# Patient Record
Sex: Female | Born: 1980
Health system: Southern US, Community
[De-identification: ages and names within clinical notes are randomized; demographics above are authoritative.]

## PROBLEM LIST (undated history)

## (undated) DIAGNOSIS — M069 Rheumatoid arthritis, unspecified: Secondary | ICD-10-CM

## (undated) DIAGNOSIS — M419 Scoliosis, unspecified: Secondary | ICD-10-CM

## (undated) DIAGNOSIS — M4302 Spondylolysis, cervical region: Secondary | ICD-10-CM

## (undated) DIAGNOSIS — M549 Dorsalgia, unspecified: Secondary | ICD-10-CM

## (undated) DIAGNOSIS — Z8719 Personal history of other diseases of the digestive system: Secondary | ICD-10-CM

## (undated) DIAGNOSIS — M5136 Other intervertebral disc degeneration, lumbar region: Secondary | ICD-10-CM

## (undated) DIAGNOSIS — M542 Cervicalgia: Secondary | ICD-10-CM

## (undated) DIAGNOSIS — E282 Polycystic ovarian syndrome: Secondary | ICD-10-CM

## (undated) DIAGNOSIS — E079 Disorder of thyroid, unspecified: Secondary | ICD-10-CM

## (undated) DIAGNOSIS — M51369 Other intervertebral disc degeneration, lumbar region without mention of lumbar back pain or lower extremity pain: Secondary | ICD-10-CM

## (undated) HISTORY — DX: Other intervertebral disc degeneration, lumbar region without mention of lumbar back pain or lower extremity pain: M51.369

## (undated) HISTORY — DX: Other intervertebral disc degeneration, lumbar region: M51.36

## (undated) HISTORY — DX: Personal history of other diseases of the digestive system: Z87.19

## (undated) HISTORY — DX: Scoliosis, unspecified: M41.9

## (undated) HISTORY — DX: Disorder of thyroid, unspecified: E07.9

## (undated) HISTORY — DX: Rheumatoid arthritis, unspecified: M06.9

## (undated) HISTORY — DX: Dorsalgia, unspecified: M54.9

## (undated) HISTORY — PX: THYROIDECTOMY: SHX17

## (undated) HISTORY — DX: Spondylolysis, cervical region: M43.02

## (undated) HISTORY — DX: Cervicalgia: M54.2

## (undated) HISTORY — PX: WISDOM TOOTH EXTRACTION: SHX21

## (undated) HISTORY — DX: Polycystic ovarian syndrome: E28.2

## (undated) HISTORY — PX: COLONOSCOPY: SHX174

---

## 2007-09-11 HISTORY — PX: ANKLE SURGERY: SHX546

## 2012-12-22 ENCOUNTER — Other Ambulatory Visit: Payer: Self-pay | Admitting: Endocrinology

## 2012-12-22 DIAGNOSIS — E049 Nontoxic goiter, unspecified: Secondary | ICD-10-CM

## 2013-01-02 ENCOUNTER — Ambulatory Visit
Admission: RE | Admit: 2013-01-02 | Discharge: 2013-01-02 | Disposition: A | Discharge status: Home / Self Care | Payer: Medicaid Other | Source: Ambulatory Visit | Attending: Endocrinology | Admitting: Endocrinology

## 2013-01-02 DIAGNOSIS — E049 Nontoxic goiter, unspecified: Secondary | ICD-10-CM

## 2013-01-02 IMAGING — US US SOFT TISSUE HEAD/NECK
1 series · 14 of 25 positions shown · non-contrast
Comparison: None.

CLINICAL DATA: Multinodular goiter.  History of Hashimoto's
thyroiditis.

THYROID ULTRASOUND
TECHNIQUE: Ultrasound examination of the thyroid gland and adjacent
soft tissues was performed.

[Series 1: us soft tissue head/neck · 0.09mm/px · 14 of 55 slices shown]
[im 1/55]
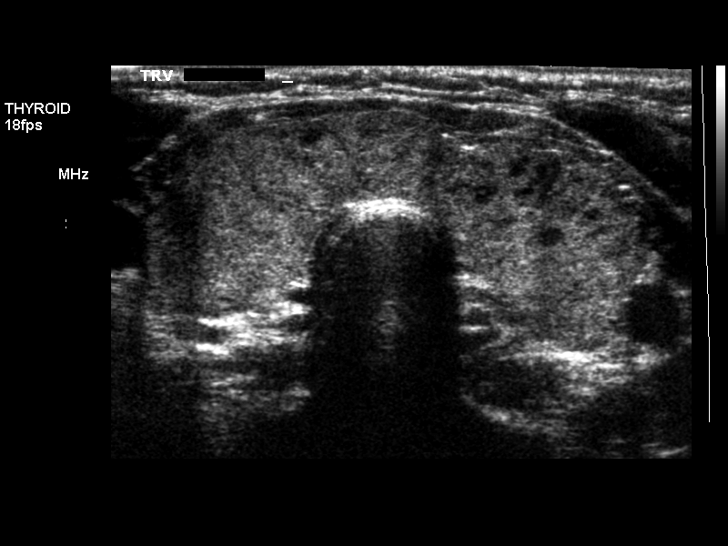
[im 5/55]
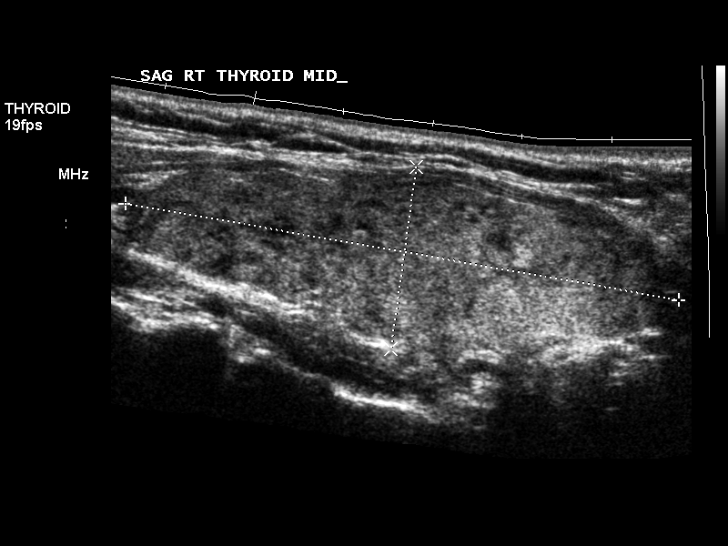
[im 10/55]
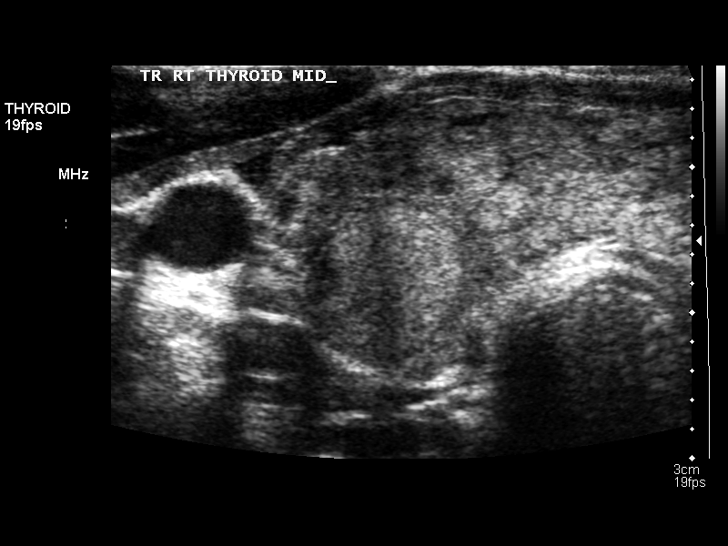
[im 14/55]
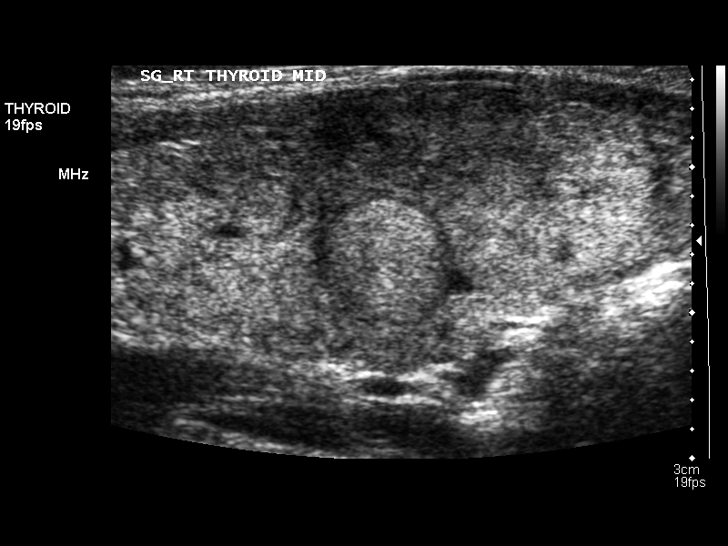
[im 19/55]
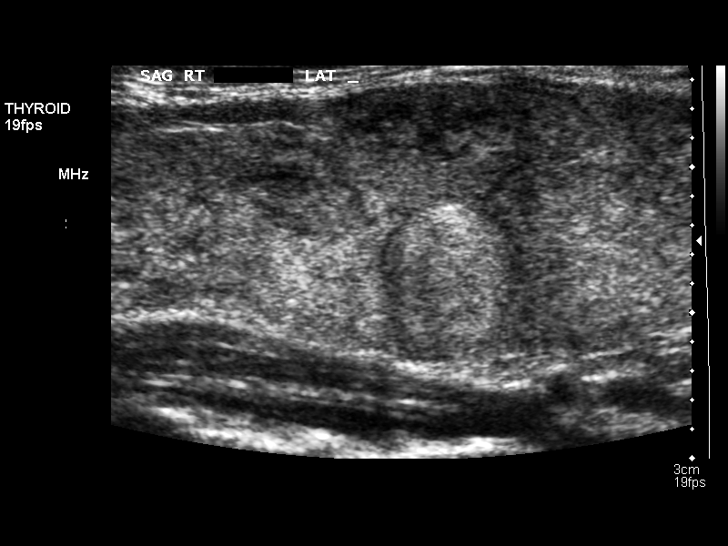
[im 21/55]
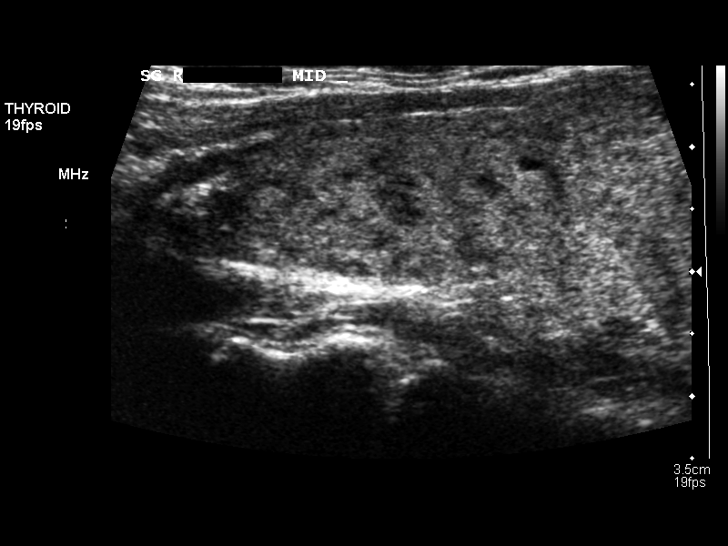
[im 25/55]
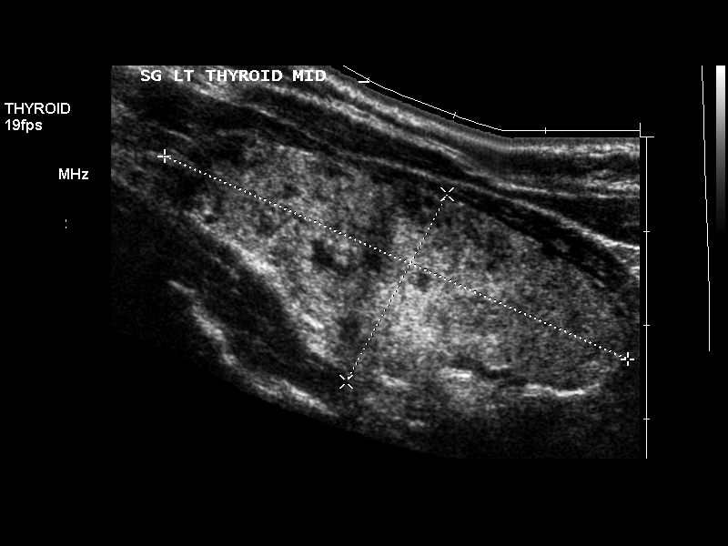
[im 30/55]
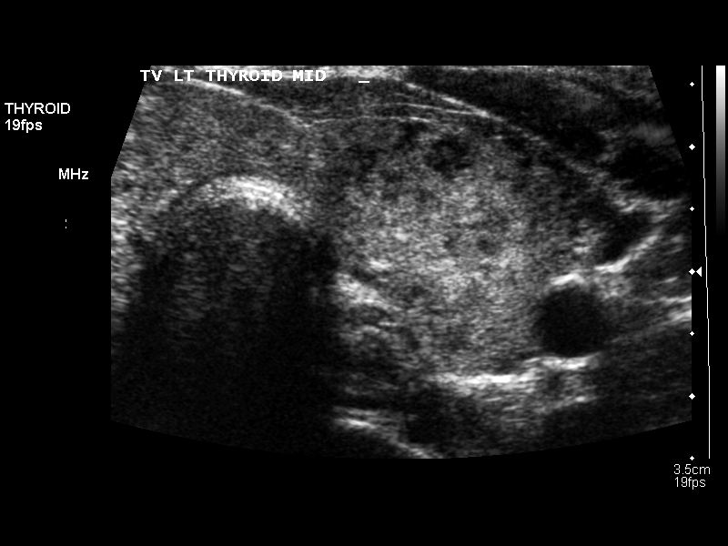
[im 34/55]
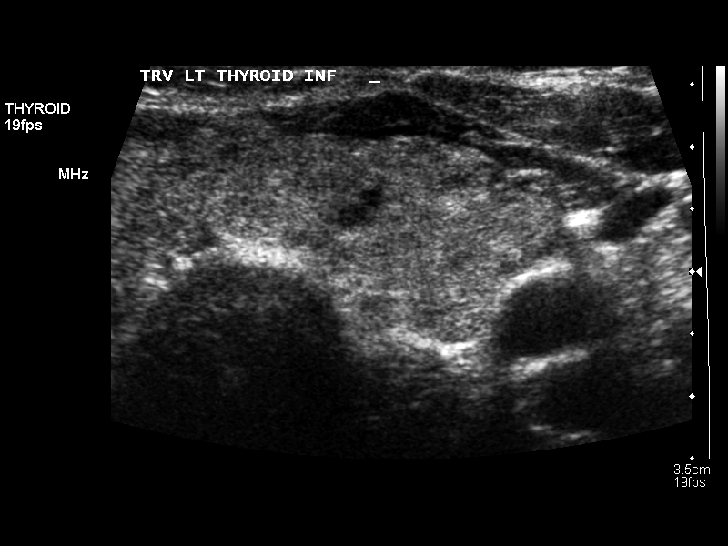
[im 37/55]
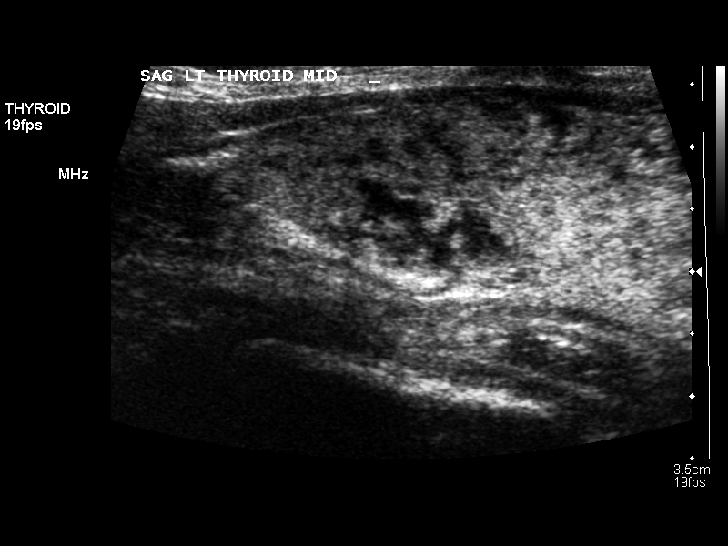
[im 41/55]
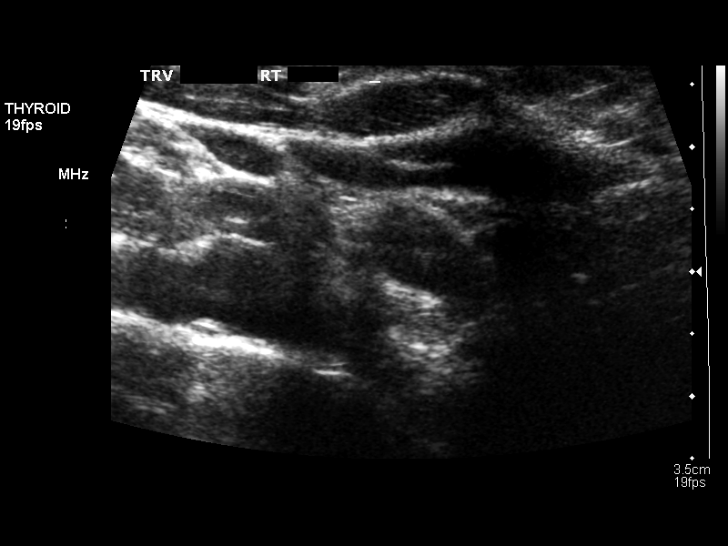
[im 46/55]
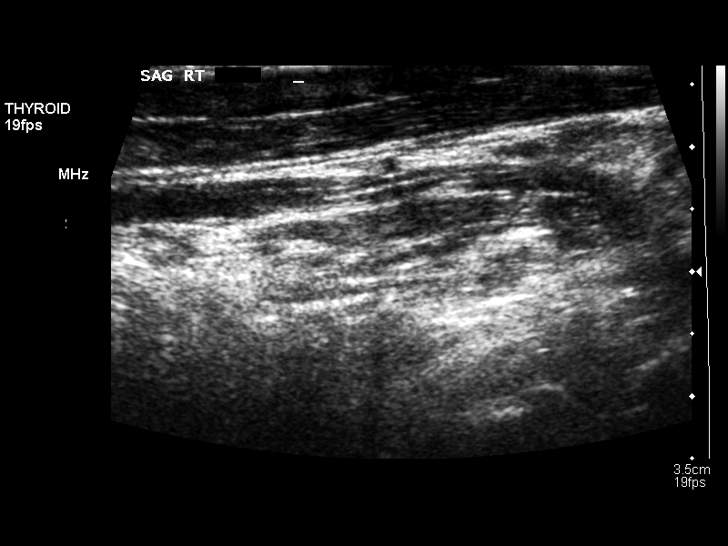
[im 50/55]
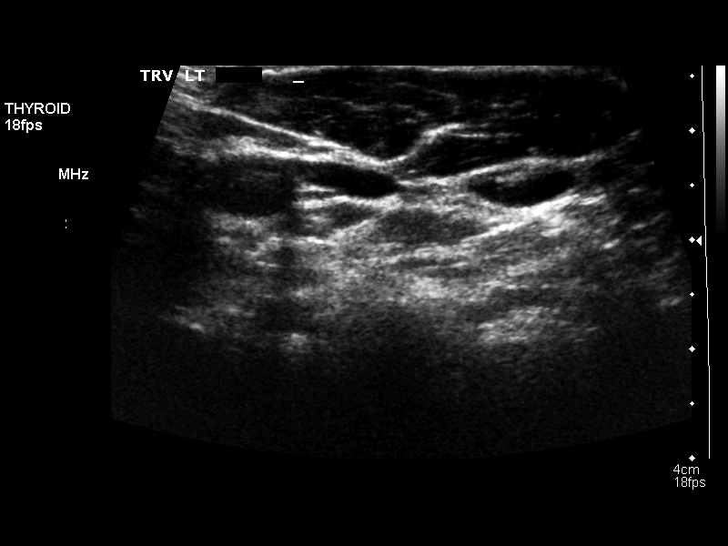
[im 55/55]
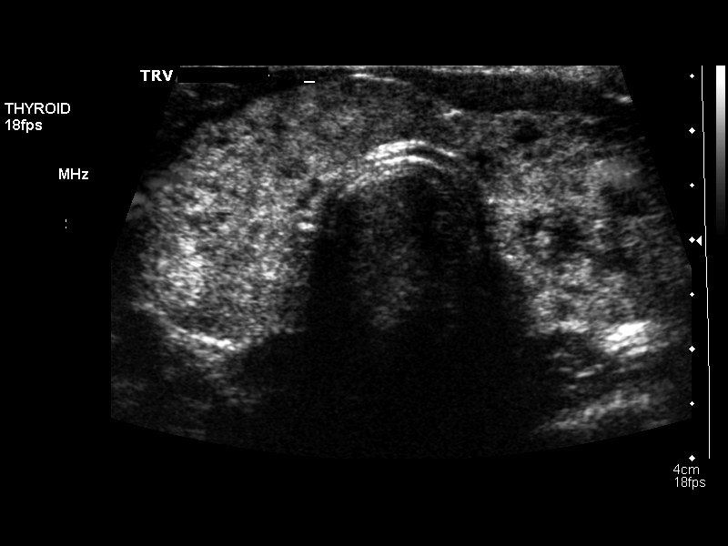

[14 of 25 positions shown; findings below may reference images not displayed]

FINDINGS: Right thyroid lobe:  62 mm x 20 mm x 20 mm.  Heterogeneous
echotexture.
Left thyroid lobe:  54 mm x 23 mm x 20 mm.  Heterogeneous texture.
Isthmus:  8 mm.

Focal nodules:  10 mm x 9 mm x 9 mm solid nodule is present in the
right thyroid lobe interpolar region.

Diffuse hypervascularity of the thyroid gland is present.

Lymphadenopathy:  None visualized.
IMPRESSION: 1.  Thyromegaly with heterogeneous echotexture compatible with
history of thyroiditis.
2.  Solid sub centimeter nodule in the right thyroid lobe.  This
nodule can be followed.
3.  Hypervascularity suggests ongoing thyroiditis.

## 2013-01-27 ENCOUNTER — Other Ambulatory Visit (HOSPITAL_COMMUNITY)
Admission: RE | Admit: 2013-01-27 | Discharge: 2013-01-27 | Disposition: A | Discharge status: Home / Self Care | Payer: Medicaid Other | Source: Ambulatory Visit | Attending: Obstetrics and Gynecology | Admitting: Obstetrics and Gynecology

## 2013-01-27 ENCOUNTER — Other Ambulatory Visit: Payer: Self-pay | Admitting: Obstetrics and Gynecology

## 2013-01-27 DIAGNOSIS — O09A0 Supervision of pregnancy with history of molar pregnancy, unspecified trimester: Secondary | ICD-10-CM | POA: Insufficient documentation

## 2013-01-27 DIAGNOSIS — Z1151 Encounter for screening for human papillomavirus (HPV): Secondary | ICD-10-CM | POA: Insufficient documentation

## 2013-01-27 DIAGNOSIS — Z01419 Encounter for gynecological examination (general) (routine) without abnormal findings: Secondary | ICD-10-CM | POA: Insufficient documentation

## 2013-06-10 ENCOUNTER — Other Ambulatory Visit: Payer: Self-pay | Admitting: *Deleted

## 2013-06-10 DIAGNOSIS — E039 Hypothyroidism, unspecified: Secondary | ICD-10-CM | POA: Insufficient documentation

## 2013-06-12 ENCOUNTER — Other Ambulatory Visit: Payer: Self-pay | Admitting: Endocrinology

## 2013-06-12 ENCOUNTER — Other Ambulatory Visit: Payer: Medicaid Other

## 2013-06-12 LAB — TSH: TSH: 1.813 u[IU]/mL (ref 0.350–4.500)

## 2013-06-16 ENCOUNTER — Ambulatory Visit (INDEPENDENT_AMBULATORY_CARE_PROVIDER_SITE_OTHER): Payer: Medicaid Other | Admitting: Endocrinology

## 2013-06-16 ENCOUNTER — Encounter: Payer: Self-pay | Admitting: Endocrinology

## 2013-06-16 VITALS — BP 108/72 | HR 70 | Temp 98.4°F | Resp 12 | Ht 63.0 in | Wt 175.2 lb

## 2013-06-16 DIAGNOSIS — E039 Hypothyroidism, unspecified: Secondary | ICD-10-CM

## 2013-06-16 DIAGNOSIS — G43909 Migraine, unspecified, not intractable, without status migrainosus: Secondary | ICD-10-CM

## 2013-06-16 DIAGNOSIS — E282 Polycystic ovarian syndrome: Secondary | ICD-10-CM

## 2013-06-16 NOTE — Patient Instructions (Signed)
Regular exercise.

## 2013-06-16 NOTE — Progress Notes (Addendum)
Patient ID: Taylor Bennett, female   DOB: 26-Apr-1981, 32 y.o.   MRN: [id number]  Reason for Appointment:  Hypothyroidism, followup visit    History of Present Illness:   The hypothyroidism was first diagnosed in 2012 with a TSH of 9.0 At that time she was being treated for a multinodular goiter with thyroxine suppression using 50 mcg Subsequently the patient's requirement for levothyroxine has increased and she was taking 100 mcg on her last visit Because of her complaints of hair loss and persistent fatigue she was given a trial of Armour Thyroid She has been taking Armour Thyroid 60 mg alternating with 90  Complaints are reported by the patient now are occasional fatigue, however tiredness is not as frequent as before. She said that she sometimes needs more sleep but also occasionally will have more swings        She does not complain of any cold intolerance or constipation.           Compliance with the medical regimen has been as prescribed with taking the tablet in the morning before breakfast. However she had difficulty breaking the tablet in half evenly  No results found for any previous visit.    Medication List       This list is accurate as of: 06/16/13 11:17 AM.  Always use your most recent med list.               aspirin-acetaminophen-caffeine 250-250-65 MG per tablet  Commonly known as:  EXCEDRIN MIGRAINE  Take 1 tablet by mouth every 6 (six) hours as needed for pain.     etonogestrel-ethinyl estradiol 0.12-0.015 MG/24HR vaginal ring  Commonly known as:  NUVARING  Place 1 each vaginally every 28 (twenty-eight) days. Insert vaginally and leave in place for 3 consecutive weeks, then remove for 1 week.     metFORMIN 500 MG tablet  Commonly known as:  GLUCOPHAGE  Take 500 mg by mouth 2 (two) times daily with a meal.     naproxen sodium 220 MG tablet  Commonly known as:  ANAPROX  Take 220 mg by mouth 2 (two) times daily with a meal.     thyroid 60 MG tablet  Commonly  known as:  ARMOUR  Take 60 mg by mouth daily before breakfast. Take 1 1/2 tablet daily alternating with 1 tablet        No past medical history on file.  No past surgical history on file.  No family history on file.  Social History:  reports that she has never smoked. She has never used smokeless tobacco. Her alcohol and drug histories are not on file.  Allergies: No Known Allergies  REVIEW of systems:  She is having difficulty with joint pains And is taking Aleve  She has a history of multinodular goiter, however the last ultrasound showed only thyroiditis with one nodule of 1 cm in size.  Migraines: Has had long history of this, is not followed by any physician  She had PCOS. Diagnosed in 4/14 when her testosterone level was 58. Subsequently has been on metformin with improvement in her levels, difficult to assess menstrual cycles because of her having an IUD   Examination:   BP 108/72  Pulse 70  Temp(Src) 98.4 F (36.9 C)  Resp 12  Ht 5\' 3"  (1.6 m)  Wt 175 lb 3.2 oz (79.47 kg)  BMI 31.04 kg/m2  SpO2 98%  LMP 06/11/2013   GENERAL APPEARANCE: Alert And looks well.   No puffiness of  face or periorbital edema.         NECK: no thyromegaly.          NEUROLOGIC EXAM: DTRs 2+ bilaterally at biceps.    Assessments   Hypothyroidism, likely to be from Hashimoto thyroiditis. Currently is on Armour Thyroid and if subjectively doing better with this than with levothyroxine and her TSH is also normal    PLAN:   Continue same dosage before breakfast daily. Avoid taking any calcium or iron supplements with the thyroid supplement.   Will reassess her testosterone level on the next visit Advised her to establish with a PCP for other problems   Bless Belshe 06/16/2013, 11:17 AM

## 2013-07-15 ENCOUNTER — Other Ambulatory Visit: Payer: Self-pay | Admitting: *Deleted

## 2013-07-15 MED ORDER — THYROID 60 MG PO TABS: 60.0000 mg | ORAL_TABLET | Freq: Every day | ORAL | Status: DC

## 2013-07-15 MED ORDER — METFORMIN HCL ER 500 MG PO TB24: ORAL_TABLET | ORAL | Status: DC

## 2013-07-15 MED ORDER — METFORMIN HCL 500 MG PO TABS: 500.0000 mg | ORAL_TABLET | Freq: Two times a day (BID) | ORAL | Status: DC

## 2013-07-17 ENCOUNTER — Other Ambulatory Visit: Payer: Self-pay | Admitting: *Deleted

## 2013-09-24 ENCOUNTER — Ambulatory Visit (INDEPENDENT_AMBULATORY_CARE_PROVIDER_SITE_OTHER): Payer: Medicaid Other | Admitting: Endocrinology

## 2013-09-24 ENCOUNTER — Encounter: Payer: Self-pay | Admitting: Endocrinology

## 2013-09-24 VITALS — BP 112/68 | HR 90 | Temp 98.2°F | Resp 12 | Ht 63.0 in | Wt 175.0 lb

## 2013-09-24 DIAGNOSIS — E039 Hypothyroidism, unspecified: Secondary | ICD-10-CM

## 2013-09-24 DIAGNOSIS — E282 Polycystic ovarian syndrome: Secondary | ICD-10-CM

## 2013-09-24 DIAGNOSIS — G9001 Carotid sinus syncope: Secondary | ICD-10-CM

## 2013-09-24 NOTE — Progress Notes (Signed)
Taylor Bennett is a 33 y.o. female.   Chief complaint: Neck pain  History of Present Illness:  For the last 2-3 weeks she has had some discomfort in her right upper neck This has been more persistent and prominent in the last 3 days She has some radiation of the pain up and down her neck, sometimes into the ear She has no difficulty or pain with swallowing although she thinks she got a metformin tablet when clearing her throat this morning on the last nights dose  She was concerned that the pain was related to her thyroid and wanted to be evaluated, has not taken any OTC medications are contacted her PCP      Medication List       This list is accurate as of: 09/24/13  3:10 PM.  Always use your most recent med list.               aspirin-acetaminophen-caffeine 250-250-65 MG per tablet  Commonly known as:  EXCEDRIN MIGRAINE  Take 1 tablet by mouth every 6 (six) hours as needed for pain.     etonogestrel-ethinyl estradiol 0.12-0.015 MG/24HR vaginal ring  Commonly known as:  [place] 1 each vaginally every 28 (twenty-eight) days. Insert vaginally and leave in place for 3 consecutive weeks, then remove for 1 week.     metFORMIN 500 MG 24 hr tablet  Commonly known as:  GLUCOPHAGE-XR  Take one tablet 2 times daily     naproxen sodium 220 MG tablet  Commonly known as:  ANAPROX  Take 220 mg by mouth 2 (two) times daily with a meal.     thyroid 60 MG tablet  Commonly known as:  ARMOUR  Take 60 mg by mouth. Take 1 1/2 tablet daily alternating with 1 tablet        Allergies: No Known Allergies  Past Medical History  Diagnosis Date  . PCOS (polycystic ovarian syndrome)   . Thyroid disease     No past surgical history on file.  Family History  Problem Relation Age of Onset  . Thyroid disease Mother   . Diabetes Father     Social History:  reports that she has never smoked. She has never used smokeless tobacco. Her alcohol and drug histories are not on  file.  Review of Systems   She has had a small multinodular goiter, initially discovered in 2009 when she had a carotid ultrasound In 12/2012 ultrasound showed only thyroiditis and a 1 cm thyroid nodule, smaller than before   LABS:  No visits with results within 1 Week(s) from this visit. Latest known visit with results is:  Orders Only on 06/12/2013  Component Date Value Range Status  . TSH 06/12/2013 1.813  0.350 - 4.500 uIU/mL Final  . Free T4 06/12/2013 0.94  0.80 - 1.80 ng/dL Final    EXAM:  BP 112/68  Pulse 90  Temp(Src) 98.2 F (36.8 C)  Resp 12  Ht 5\' 3"  (1.6 m)  Wt 175 lb (79.379 kg)  BMI 31.01 kg/m2  SpO2 97%  LMP 09/10/2013  There is no lymphadenopathy in the neck Her thyroid is just palpable on the right side, smooth, nontender and slightly firm. No nodules palpable She has mild tenderness over the carotid bulb area but not clearly defined No other mass felt in the neck or submandibular area Tympanic membrane on the right is normal. Normal pharyngeal exam  Assessment/Plan:   She has a right-sided neck pain which is unrelated to her  thyroid She may have carotidynia as she does not have any other findings on exam except mild tenderness in that area Offered the patient a course of nonsteroidal anti-inflammatory drugs but she prefers to see her PCP  She will followup in 3 months for her thyroid and PCOS.  Taylor Bennett 09/24/2013, 3:10 PM

## 2013-11-18 ENCOUNTER — Other Ambulatory Visit: Payer: Medicaid Other

## 2013-11-23 ENCOUNTER — Other Ambulatory Visit: Payer: Medicaid Other

## 2013-11-23 ENCOUNTER — Ambulatory Visit: Payer: Medicaid Other | Admitting: Endocrinology

## 2013-12-23 ENCOUNTER — Other Ambulatory Visit (INDEPENDENT_AMBULATORY_CARE_PROVIDER_SITE_OTHER): Payer: Medicaid Other

## 2013-12-23 ENCOUNTER — Other Ambulatory Visit: Payer: Medicaid Other

## 2013-12-23 DIAGNOSIS — E282 Polycystic ovarian syndrome: Secondary | ICD-10-CM

## 2013-12-23 DIAGNOSIS — E039 Hypothyroidism, unspecified: Secondary | ICD-10-CM

## 2013-12-23 LAB — TSH: TSH: 1.11 u[IU]/mL (ref 0.35–5.50)

## 2013-12-23 LAB — T4, FREE: FREE T4: 0.68 ng/dL (ref 0.60–1.60)

## 2013-12-25 LAB — TESTOSTERONE, FREE, TOTAL, SHBG
TESTOSTERONE FREE: 2.4 pg/mL (ref 0.0–4.2)
TESTOSTERONE, TOTAL: 23.9 ng/dL (ref 10.0–55.0)

## 2013-12-29 ENCOUNTER — Encounter: Payer: Self-pay | Admitting: Endocrinology

## 2013-12-29 ENCOUNTER — Ambulatory Visit (INDEPENDENT_AMBULATORY_CARE_PROVIDER_SITE_OTHER): Payer: Medicaid Other | Admitting: Endocrinology

## 2013-12-29 VITALS — BP 118/82 | HR 78 | Temp 97.7°F | Resp 16 | Ht 63.0 in | Wt 174.8 lb

## 2013-12-29 DIAGNOSIS — E282 Polycystic ovarian syndrome: Secondary | ICD-10-CM

## 2013-12-29 DIAGNOSIS — E039 Hypothyroidism, unspecified: Secondary | ICD-10-CM

## 2013-12-29 DIAGNOSIS — E042 Nontoxic multinodular goiter: Secondary | ICD-10-CM

## 2013-12-29 DIAGNOSIS — H8110 Benign paroxysmal vertigo, unspecified ear: Secondary | ICD-10-CM

## 2013-12-29 NOTE — Patient Instructions (Signed)
Stop Metformin

## 2013-12-29 NOTE — Progress Notes (Signed)
Patient ID: Taylor Bennett, female   DOB: [birth year], 33 y.o.   MRN: [id number]   Reason for Appointment: Followup   History of Present Illness:   1. Primary hypothyroidism was first diagnosed in 2012 with a TSH of 9.0 At that time she was being treated for a multinodular goiter with thyroxine suppression using 50 mcg Subsequently the patient's requirement for levothyroxine has increased and she had been taking 100 mcg Because of her complaints of hair loss and persistent fatigue she was given a trial of Armour Thyroid With this change her energy level overall improved She has been taking Armour Thyroid 60 mg alternating with 90 with consistent TSH levels  Complaints are reported by the patient now are mild fatigue, however tiredness is not as frequent as before. She is still complaining about hair loss She does not complain of any new cold intolerance or constipation.           Compliance with the medical regimen has been as prescribed with taking the tablet in the morning before breakfast.    2. Polycystic ovarian syndrome  She had PCOS diagnosed in 4/14 when her total testosterone level was 58. Her only symptoms appeared to be hair loss and long-standing irregular menstrual cycles.  Subsequently has been on metformin with improvement in her testosterone levels.  However it is difficult to assess menstrual cycles because of her having an IUD She tends to  forget her metformin in the evening and has been mostly taking 1 tablet of 500 mg daily. Her testosterone level is still quite normal   Appointment on 12/23/2013  Component Date Value Ref Range Status  . Free T4 12/23/2013 0.68  0.60 - 1.60 ng/dL Final  . TSH 12/23/2013 1.11  0.35 - 5.50 uIU/mL Final  . Testosterone, total 12/23/2013 23.9  10.0 - 55.0 ng/dL Final  . Testosterone, Free 12/23/2013 2.4  0.0 - 4.2 pg/mL Final      Medication List       This list is accurate as of: 12/29/13 11:05 AM.  Always use your most recent med  list.               aspirin-acetaminophen-caffeine 250-250-65 MG per tablet  Commonly known as:  EXCEDRIN MIGRAINE  Take 1 tablet by mouth every 6 (six) hours as needed for pain.     etonogestrel-ethinyl estradiol 0.12-0.015 MG/24HR vaginal ring  Commonly known as:  NUVARING  Place 1 each vaginally every 28 (twenty-eight) days. Insert vaginally and leave in place for 3 consecutive weeks, then remove for 1 week.     metFORMIN 500 MG 24 hr tablet  Commonly known as:  GLUCOPHAGE-XR  Take one tablet 2 times daily     naproxen sodium 220 MG tablet  Commonly known as:  ANAPROX  Take 220 mg by mouth 2 (two) times daily with a meal.     thyroid 60 MG tablet  Commonly known as:  ARMOUR  Take 60 mg by mouth. Take 1 1/2 tablet daily alternating with 1 tablet        Past Medical History  Diagnosis Date  . PCOS (polycystic ovarian syndrome)   . Thyroid disease     No past surgical history on file.  Family History  Problem Relation Age of Onset  . Thyroid disease Mother   . Diabetes Father     Social History:  reports that she has never smoked. She has never used smokeless tobacco. Her alcohol and drug histories are not on file.  Allergies: No Known Allergies  REVIEW of systems:  She is having occasional transient vertigo-like symptoms  She has a history of multinodular goiter, however the last ultrasound showed only thyroiditis with one nodule of 1 cm in size.  Migraines: Has had long history of this, is not followed by any physician  Difficulty losing weight:  Wt Readings from Last 3 Encounters:  12/29/13 174 lb 12.8 oz (79.289 kg)  09/24/13 175 lb (79.379 kg)  06/16/13 175 lb 3.2 oz (79.47 kg)     Examination:   BP 118/82  Pulse 78  Temp(Src) 97.7 F (36.5 C)  Resp 16  Ht 5\' 3"  (1.6 m)  Wt 174 lb 12.8 oz (79.289 kg)  BMI 30.97 kg/m2  SpO2 97%   GENERAL APPEARANCE: Alert And looks well.   No puffiness of face or periorbital edema.         NECK:  her  thyroid is enlarged about twice normal on the right side, less on the left side, smooth without nodule formation         NEUROLOGIC EXAM:  reflexes normal bilaterally at biceps. No significant on the fascia or facial hair    Assessments:    Hypothyroidism, likely to be from [place] thyroiditis associated with a goiter Currently is on Armour Thyroid 60 alternating with 90 mg TSH is consistently normal She probably does have fatigue for other reasons and also chronic cord intolerance Also reassured her that her goiter does not need to be followed with ultrasound exams   History of irregular menstrual cycles and ? PCOS. Her testosterone level is quite normal even with only 500 mg of metformin. Not clear if she truly has had polycystic ovarian syndrome Most likely her hair loss is autoimmune in nature and unrelated to hyperaldosteronism Encouraged her to have a regular program of exercise and watch her diet for weight loss especially with family history of diabetes   PLAN:   Continue same dosage of Armour Thyroid before breakfast daily. Avoid taking any calcium or iron supplements with the thyroid supplement.   Stop metformin for now   Will reassess her testosterone level on the next visit  Advised her to followup with her PCP to evaluate symptoms of possible vertigo   Elayne Snare 12/29/2013, 11:05 AM

## 2014-01-29 ENCOUNTER — Other Ambulatory Visit: Payer: Self-pay | Admitting: Obstetrics and Gynecology

## 2014-01-29 ENCOUNTER — Other Ambulatory Visit (HOSPITAL_COMMUNITY)
Admission: RE | Admit: 2014-01-29 | Discharge: 2014-01-29 | Disposition: A | Discharge status: Home / Self Care | Payer: Medicaid Other | Source: Ambulatory Visit | Attending: Obstetrics and Gynecology | Admitting: Obstetrics and Gynecology

## 2014-01-29 DIAGNOSIS — Z01419 Encounter for gynecological examination (general) (routine) without abnormal findings: Secondary | ICD-10-CM | POA: Insufficient documentation

## 2014-06-07 ENCOUNTER — Other Ambulatory Visit (INDEPENDENT_AMBULATORY_CARE_PROVIDER_SITE_OTHER): Payer: Medicaid Other

## 2014-06-07 ENCOUNTER — Other Ambulatory Visit: Payer: Medicaid Other

## 2014-06-07 DIAGNOSIS — E282 Polycystic ovarian syndrome: Secondary | ICD-10-CM

## 2014-06-07 DIAGNOSIS — E039 Hypothyroidism, unspecified: Secondary | ICD-10-CM

## 2014-06-07 LAB — GLUCOSE, RANDOM: GLUCOSE: 97 mg/dL (ref 70–99)

## 2014-06-07 LAB — T4, FREE: Free T4: 0.77 ng/dL (ref 0.60–1.60)

## 2014-06-07 LAB — TSH: TSH: 0.66 u[IU]/mL (ref 0.35–4.50)

## 2014-06-09 LAB — TESTOSTERONE, FREE, TOTAL, SHBG
Testosterone, Free: 1 pg/mL (ref 0.0–4.2)
Testosterone, total: 21.9 ng/dL (ref 10.0–55.0)

## 2014-06-10 ENCOUNTER — Encounter: Payer: Self-pay | Admitting: Endocrinology

## 2014-06-10 ENCOUNTER — Ambulatory Visit (INDEPENDENT_AMBULATORY_CARE_PROVIDER_SITE_OTHER): Payer: Medicaid Other | Admitting: Endocrinology

## 2014-06-10 VITALS — BP 116/80 | HR 90 | Temp 97.8°F | Resp 14 | Ht 63.0 in | Wt 176.8 lb

## 2014-06-10 DIAGNOSIS — E039 Hypothyroidism, unspecified: Secondary | ICD-10-CM

## 2014-06-10 DIAGNOSIS — L659 Nonscarring hair loss, unspecified: Secondary | ICD-10-CM

## 2014-06-10 NOTE — Progress Notes (Signed)
Patient ID: Taylor Bennett, female   DOB: 01/08/1981, 33 y.o.   MRN: [id number]   Reason for Appointment: Followup   History of Present Illness:   1. Primary hypothyroidism was first diagnosed in 2012 with a TSH of 9.0 At that time she was being treated for a multinodular goiter with thyroxine suppression using 50 mcg Subsequently the patient's requirement for levothyroxine has increased and she had been taking 100 mcg Because of her complaints of hair loss and persistent fatigue she was given a trial of Armour Thyroid With this change her energy level was overall improved She has been taking Armour Thyroid 60 mg alternating with 90 with consistent TSH levels  Complaints are reported by the patient now are tiredness and she feels like she has to take a nap every 4 hours  This has been persistent She is still having some thinning of the hair and hair loss but somewhat less than before         Compliance with the medical regimen has been as prescribed with taking the tablet in the morning before breakfast.   She has a history of multinodular goiter, however the last ultrasound showed only thyroiditis with one nodule of 1 cm in size.   2. ? Polycystic ovarian syndrome She had PCOS diagnosed in 4/14 by another physician when her total testosterone level was 58.  Her only symptoms appeared to be hair loss and  previously irregular menstrual cycles which were regulated by birth control pills  Subsequently had been on metformin with improvement in her testosterone levels However she was taking only 500 mg of metformin and with stopping this her testosterone levels are still normal She now says that she has regular menstrual cycles even though she is using a NuvaRing   Appointment on 06/07/2014  Component Date Value Ref Range Status  . Glucose, Bld 06/07/2014 97  70 - 99 mg/dL Final  . TSH 06/07/2014 0.66  0.35 - 4.50 uIU/mL Final  . Free T4 06/07/2014 0.77  0.60 - 1.60 ng/dL Final  .  Testosterone, total 06/07/2014 21.9  10.0 - 55.0 ng/dL Final  . Testosterone, Free 06/07/2014 1.0  0.0 - 4.2 pg/mL Final      Medication List       This list is accurate as of: 06/10/14 10:10 AM.  Always use your most recent med list.               etonogestrel-ethinyl estradiol 0.12-0.015 MG/24HR vaginal ring  Commonly known as:  NUVARING  Place 1 each vaginally every 28 (twenty-eight) days. Insert vaginally and leave in place for 3 consecutive weeks, then remove for 1 week.     metFORMIN 500 MG 24 hr tablet  Commonly known as:  GLUCOPHAGE-XR  Take one tablet 2 times daily     naproxen sodium 220 MG tablet  Commonly known as:  ANAPROX  Take 220 mg by mouth 2 (two) times daily with a meal.     OVER THE COUNTER MEDICATION  Total control Herbalife     rizatriptan 10 MG tablet  Commonly known as:  MAXALT  Take 10 mg by mouth as needed for migraine. May repeat in 2 hours if needed     thyroid 60 MG tablet  Commonly known as:  ARMOUR  Take 60 mg by mouth. Take 1 1/2 tablet daily alternating with 1 tablet        Past Medical History  Diagnosis Date  . PCOS (polycystic ovarian syndrome)   .  Thyroid disease     No past surgical history on file.  Family History  Problem Relation Age of Onset  . Thyroid disease Mother   . Diabetes Father     Social History:  reports that she has never smoked. She has never used smokeless tobacco. Her alcohol and drug histories are not on file.  Allergies: No Known Allergies  REVIEW of systems:  She is having increased bruising for the last month and she thinks her blood didn't clot as quickly when she had a blood draw  Migraines: Treated by PCP  Difficulty losing weight:  Wt Readings from Last 3 Encounters:  06/10/14 176 lb 12.8 oz (80.196 kg)  12/29/13 174 lb 12.8 oz (79.289 kg)  09/24/13 175 lb (79.379 kg)     Examination:   BP 116/80  Pulse 90  Temp(Src) 97.8 F (36.6 C)  Resp 14  Ht 5\' 3"  (1.6 m)  Wt 176 lb  12.8 oz (80.196 kg)  BMI 31.33 kg/m2  SpO2 96%   GENERAL APPEARANCE: She looks well    No puffiness of face or periorbital edema.  Minimal hirsutism, no alopecia        NECK:  her thyroid is enlarged about twice normal on the right side relatively smooth and firm, left side not palpable  NEUROLOGIC EXAM:  reflexes normal bilaterally at biceps. Has scattered ecchymoses on her skin    Assessments:    Hypothyroidism, likely to be from [place] thyroiditis associated with a goiter Currently is on Armour Thyroid 60 alternating with 90 mg TSH has been consistently normal She probably does have unrelated fatigue and needs to discuss with PCP Her goiter is fairly stable and feels typical of Hashimoto's thyroiditis   History of irregular menstrual cycles and ? PCOS. Her testosterone level is quite normal without metformin and do not think she has PCOS now.   She does have a upper normal fasting glucose of 97 Encouraged her to have a regular program of exercise and watch her diet for weight loss especially with family history of diabetes   PLAN:   Continue same dosage of Armour Thyroid before breakfast daily.  Advised her to followup with her PCP to evaluate bruising     Dezyrae Kensinger 06/10/2014, 10:10 AM

## 2014-08-09 ENCOUNTER — Other Ambulatory Visit: Payer: Self-pay | Admitting: Endocrinology

## 2014-08-11 ENCOUNTER — Other Ambulatory Visit: Payer: Self-pay | Admitting: *Deleted

## 2015-01-31 ENCOUNTER — Other Ambulatory Visit: Payer: Self-pay | Admitting: Obstetrics and Gynecology

## 2015-01-31 ENCOUNTER — Other Ambulatory Visit (HOSPITAL_COMMUNITY)
Admission: RE | Admit: 2015-01-31 | Discharge: 2015-01-31 | Disposition: A | Discharge status: Home / Self Care | Payer: Medicaid Other | Source: Ambulatory Visit | Attending: Obstetrics and Gynecology | Admitting: Obstetrics and Gynecology

## 2015-01-31 DIAGNOSIS — Z01419 Encounter for gynecological examination (general) (routine) without abnormal findings: Secondary | ICD-10-CM | POA: Diagnosis not present

## 2015-02-01 LAB — CYTOLOGY - PAP

## 2015-07-19 ENCOUNTER — Emergency Department (HOSPITAL_BASED_OUTPATIENT_CLINIC_OR_DEPARTMENT_OTHER)
Admission: EM | Admit: 2015-07-19 | Discharge: 2015-07-19 | Disposition: A | Discharge status: Home / Self Care | Payer: 59 | Attending: Emergency Medicine | Admitting: Emergency Medicine

## 2015-07-19 ENCOUNTER — Encounter (HOSPITAL_BASED_OUTPATIENT_CLINIC_OR_DEPARTMENT_OTHER): Payer: Self-pay | Admitting: *Deleted

## 2015-07-19 DIAGNOSIS — H7491 Unspecified disorder of right middle ear and mastoid: Secondary | ICD-10-CM | POA: Insufficient documentation

## 2015-07-19 DIAGNOSIS — Z79899 Other long term (current) drug therapy: Secondary | ICD-10-CM | POA: Insufficient documentation

## 2015-07-19 DIAGNOSIS — R51 Headache: Secondary | ICD-10-CM | POA: Diagnosis not present

## 2015-07-19 DIAGNOSIS — R42 Dizziness and giddiness: Secondary | ICD-10-CM | POA: Insufficient documentation

## 2015-07-19 DIAGNOSIS — Z3202 Encounter for pregnancy test, result negative: Secondary | ICD-10-CM | POA: Diagnosis not present

## 2015-07-19 DIAGNOSIS — R110 Nausea: Secondary | ICD-10-CM | POA: Insufficient documentation

## 2015-07-19 DIAGNOSIS — E079 Disorder of thyroid, unspecified: Secondary | ICD-10-CM | POA: Diagnosis not present

## 2015-07-19 LAB — COMPREHENSIVE METABOLIC PANEL
ALBUMIN: 3.6 g/dL (ref 3.5–5.0)
ALK PHOS: 58 U/L (ref 38–126)
ALT: 11 U/L — AB (ref 14–54)
AST: 17 U/L (ref 15–41)
Anion gap: 6 (ref 5–15)
BILIRUBIN TOTAL: 0.4 mg/dL (ref 0.3–1.2)
BUN: 14 mg/dL (ref 6–20)
CALCIUM: 8.8 mg/dL — AB (ref 8.9–10.3)
CO2: 26 mmol/L (ref 22–32)
CREATININE: 0.67 mg/dL (ref 0.44–1.00)
Chloride: 105 mmol/L (ref 101–111)
GFR calc Af Amer: >60 mL/min (ref >60)
GFR calc non Af Amer: >60 mL/min (ref >60)
GLUCOSE: 99 mg/dL (ref 65–99)
Potassium: 3.8 mmol/L (ref 3.5–5.1)
SODIUM: 137 mmol/L (ref 135–145)
Total Protein: 7.3 g/dL (ref 6.5–8.1)

## 2015-07-19 LAB — CBC WITH DIFFERENTIAL/PLATELET
BASOS PCT: 0 %
Basophils Absolute: 0 10*3/uL (ref 0.0–0.1)
EOS ABS: 0 10*3/uL (ref 0.0–0.7)
Eosinophils Relative: 0 %
HEMATOCRIT: 40.3 % (ref 36.0–46.0)
HEMOGLOBIN: 13.4 g/dL (ref 12.0–15.0)
LYMPHS ABS: 2 10*3/uL (ref 0.7–4.0)
Lymphocytes Relative: 21 %
MCH: 32 pg (ref 26.0–34.0)
MCHC: 33.3 g/dL (ref 30.0–36.0)
MCV: 96.2 fL (ref 78.0–100.0)
Monocytes Absolute: 0.9 10*3/uL (ref 0.1–1.0)
Monocytes Relative: 10 %
NEUTROS ABS: 6.5 10*3/uL (ref 1.7–7.7)
NEUTROS PCT: 69 %
Platelets: 303 10*3/uL (ref 150–400)
RBC: 4.19 MIL/uL (ref 3.87–5.11)
RDW: 11.8 % (ref 11.5–15.5)
WBC: 9.4 10*3/uL (ref 4.0–10.5)

## 2015-07-19 LAB — PREGNANCY, URINE: Preg Test, Ur: NEGATIVE

## 2015-07-19 LAB — TSH: TSH: 0.962 u[IU]/mL (ref 0.350–4.500)

## 2015-07-19 MED ORDER — MECLIZINE HCL 25 MG PO TABS
25.0000 mg | ORAL_TABLET | Freq: Once | ORAL | Status: AC
Start: 2015-07-19 — End: 2015-07-19
  Administered 2015-07-19: 25 mg via ORAL
  Filled 2015-07-19: qty 1

## 2015-07-19 MED ORDER — DEXAMETHASONE 4 MG PO TABS
10.0000 mg | ORAL_TABLET | Freq: Once | ORAL | Status: AC
  Administered 2015-07-19: 10 mg via ORAL

## 2015-07-19 MED ORDER — MECLIZINE HCL 25 MG PO TABS: 25.0000 mg | ORAL_TABLET | Freq: Three times a day (TID) | ORAL | Status: AC | PRN

## 2015-07-19 MED ORDER — DEXAMETHASONE 4 MG PO TABS
ORAL_TABLET | ORAL | Status: AC
  Filled 2015-07-19: qty 3

## 2015-07-19 NOTE — ED Notes (Signed)
Dizziness on and off x 2 weeks. Sharp pain in the right side of her head and neck pain. She has been able to work and drive.

## 2015-07-19 NOTE — ED Provider Notes (Signed)
CSN: [id number]   Arrival date & time 07/19/15 1646  History  By signing my name below, I, Taylor Bennett, attest that this documentation has been prepared under the direction and in the presence of Taylor Quale, MD. Electronically Signed: Altamease Bennett, ED Scribe. 07/19/2015. 7:39 PM.  Chief Complaint  Patient presents with  . Dizziness    HPI The history is provided by the patient. No language interpreter was used.   Taylor Bennett is a 34 y.o. female with history of thyroid disease and migraines who presents to the Emergency Department complaining of new and intermittent dizziness with onset 2 weeks ago. Prior to the onset of dizziness she was getting over a cold. The sensation is usually elicited with movement of the head and screen time at work.  Associated symptoms include an episode of sharp right sided head pains, nausea, loose stools,  and elevated blood pressure measured at (148/90). Pt denies daily headache and vomiting. She states that she has not seen her endocrinologist in years. Her primary care is with Dr. Kirby Crigler at [facility]. No history of DM or HTN.   Past Medical History  Diagnosis Date  . PCOS (polycystic ovarian syndrome)   . Thyroid disease     History reviewed. No pertinent past surgical history.  Family History  Problem Relation Age of Onset  . Thyroid disease Mother   . Diabetes Father     Social History  Substance Use Topics  . Smoking status: Never Smoker   . Smokeless tobacco: Never Used  . Alcohol Use: No     Review of Systems  Constitutional: Negative for fever, chills and fatigue.  HENT: Negative for congestion, postnasal drip and rhinorrhea.   Eyes: Negative for pain, redness and visual disturbance.  Respiratory: Negative for cough, chest tightness and shortness of breath.   Cardiovascular: Negative for chest pain, palpitations and leg swelling.  Gastrointestinal: Positive for nausea. Negative for vomiting, abdominal pain and  diarrhea.  Genitourinary: Negative for dysuria, urgency and hematuria.  Musculoskeletal: Negative for back pain and neck pain.  Skin: Negative for rash.  Neurological: Positive for dizziness. Negative for syncope, facial asymmetry, speech difficulty, weakness and numbness.       An episode of head pain   Home Medications   Prior to Admission medications   Medication Sig Start Date End Date Taking? Authorizing Provider  ARMOUR THYROID 60 MG tablet TAKE ONE TABLET BY MOUTH ONCE DAILY BEFORE  BREAKFAST,  THEN TAKE  1  AND  1/2  TABLET  DAILY  ALTERNATING  WITH  1  TABLET 08/09/14   Taylor Snare, MD  etonogestrel-ethinyl estradiol (NUVARING) 0.12-0.015 MG/24HR vaginal ring Place 1 each vaginally every 28 (twenty-eight) days. Insert vaginally and leave in place for 3 consecutive weeks, then remove for 1 week.    Historical Provider, MD  meclizine (ANTIVERT) 25 MG tablet Take 1 tablet (25 mg total) by mouth 3 (three) times daily as needed for dizziness. 07/19/15   Taylor Quale, MD  metFORMIN (GLUCOPHAGE-XR) 500 MG 24 hr tablet Take one tablet 2 times daily 07/15/13   Taylor Snare, MD  naproxen sodium (ANAPROX) 220 MG tablet Take 220 mg by mouth 2 (two) times daily with a meal.    Historical Provider, MD  [place] Total control Herbalife    Historical Provider, MD  rizatriptan (MAXALT) 10 MG tablet Take 10 mg by mouth as needed for migraine. May repeat in 2 hours if needed    Historical  Provider, MD  thyroid (ARMOUR) 60 MG tablet Take 60 mg by mouth. Take 1 1/2 tablet daily alternating with 1 tablet 07/15/13   Taylor Snare, MD    Allergies  Review of patient's allergies indicates no known allergies.  Triage Vitals: BP 135/93 mmHg  Pulse 90  Temp(Src) 99 F (37.2 C) (Oral)  Resp 20  Ht 5\' 3"  (1.6 m)  Wt 184 lb (83.462 kg)  BMI 32.60 kg/m2  SpO2 100%  LMP 07/12/2015  Physical Exam  Constitutional: She is oriented to person, place, and time. She appears well-developed and  well-nourished. No distress.  HENT:  Head: Normocephalic and atraumatic.  Right Ear: External ear normal. No tenderness. No mastoid tenderness. Tympanic membrane is injected. Tympanic membrane is not erythematous. A middle ear effusion is present.  Left Ear: External ear normal. No tenderness. No mastoid tenderness. Tympanic membrane is injected. Tympanic membrane is not erythematous.  No middle ear effusion.  Nose: Nose normal.  Mouth/Throat: Oropharynx is clear and moist. No oropharyngeal exudate.  Eyes: Pupils are equal, round, and reactive to light. Right eye exhibits nystagmus (mostly rightward and fatigable). Left eye exhibits nystagmus (mostly right ward and fatigable).  Neck: Normal range of motion. Neck supple.  Cardiovascular: Normal rate, regular rhythm, normal heart sounds and intact distal pulses.   No murmur heard. Pulmonary/Chest: Effort normal and breath sounds normal. No respiratory distress. She has no wheezes. She has no rales.  Abdominal: Soft. She exhibits no distension. There is no tenderness.  Musculoskeletal: Normal range of motion. She exhibits no edema or tenderness.  Neurological: She is alert and oriented to person, place, and time. She has normal strength. She is not disoriented. No cranial nerve deficit or sensory deficit. Coordination and gait normal.  Dizziness reproducible with head movement to the right  Skin: Skin is warm and dry. No rash noted. She is not diaphoretic.  Psychiatric: She has a normal mood and affect. Judgment normal.  Nursing note and vitals reviewed.   ED Course  Procedures   DIAGNOSTIC STUDIES: Oxygen Saturation is 100% on RA, normal by my interpretation.    COORDINATION OF CARE: 6:28 PM Discussed treatment plan which includes lab work and antivert with pt at bedside and pt agreed to plan.  Labs Reviewed  COMPREHENSIVE METABOLIC PANEL - Abnormal; Notable for the following:    Calcium 8.8 (*)    ALT 11 (*)    All other components  within normal limits  CBC WITH DIFFERENTIAL/PLATELET  TSH  PREGNANCY, URINE    Imaging Review No results found.  I personally reviewed and evaluated these lab results as a part of my medical decision-making.  MDM  Patient seen and evaluated in stable condition.  Mild fluid behind right TM.  Examination consistent with peripheral vertigo likely brought on by initial viral infection.  Labs unremarkable.  Patient given antivert and decadron with improvement.  Patient discharged home in stable condition with instructions to rest, follow up outpatient, and with prescription for antivert.  All questions answered prior to discharge.  8:39 AM 07/20/15 Patient called and message left as instructed by patient letting her know of her normal TSH. Final diagnoses:  Vertigo    I personally performed the services described in this documentation, which was scribed in my presence. The recorded information has been reviewed and is accurate.   Taylor Quale, MD 07/20/15 [phone]

## 2015-07-19 NOTE — Discharge Instructions (Signed)
Vertigo  Keep yourself well hydrated.  Take the Antivert as needed.  Follow up with your primary care physician and endocrinologist.  Vertigo means you feel like you or your surroundings are moving when they are not. Vertigo can be dangerous if it occurs when you are at work, driving, or performing difficult activities.  CAUSES  Vertigo occurs when there is a conflict of signals sent to your brain from the visual and sensory systems in your body. There are many different causes of vertigo, including:  Infections, especially in the inner ear.  A bad reaction to a drug or misuse of alcohol and medicines.  Withdrawal from drugs or alcohol.  Rapidly changing positions, such as lying down or rolling over in bed.  A migraine headache.  Decreased blood flow to the brain.  Increased pressure in the brain from a head injury, infection, tumor, or bleeding. SYMPTOMS  You may feel as though the world is spinning around or you are falling to the ground. Because your balance is upset, vertigo can cause nausea and vomiting. You may have involuntary eye movements (nystagmus). DIAGNOSIS  Vertigo is usually diagnosed by physical exam. If the cause of your vertigo is unknown, your caregiver may perform imaging tests, such as an MRI scan (magnetic resonance imaging). TREATMENT  Most cases of vertigo resolve on their own, without treatment. Depending on the cause, your caregiver may prescribe certain medicines. If your vertigo is related to body position issues, your caregiver may recommend movements or procedures to correct the problem. In rare cases, if your vertigo is caused by certain inner ear problems, you may need surgery. HOME CARE INSTRUCTIONS   Follow your caregiver's instructions.  Avoid driving.  Avoid operating heavy machinery.  Avoid performing any tasks that would be dangerous to you or others during a vertigo episode.  Tell your caregiver if you notice that certain medicines seem to  be causing your vertigo. Some of the medicines used to treat vertigo episodes can actually make them worse in some people. SEEK IMMEDIATE MEDICAL CARE IF:   Your medicines do not relieve your vertigo or are making it worse.  You develop problems with talking, walking, weakness, or using your arms, hands, or legs.  You develop severe headaches.  Your nausea or vomiting continues or gets worse.  You develop visual changes.  A family member notices behavioral changes.  Your condition gets worse. MAKE SURE YOU:  Understand these instructions.  Will watch your condition.  Will get help right away if you are not doing well or get worse.   This information is not intended to replace advice given to you by your health care provider. Make sure you discuss any questions you have with your health care provider.   Document Released: 06/06/2005 Document Revised: 11/19/2011 Document Reviewed: 12/20/2014 Elsevier Interactive Patient Education [employer].

## 2015-07-28 ENCOUNTER — Other Ambulatory Visit: Payer: Self-pay | Admitting: *Deleted

## 2015-07-28 ENCOUNTER — Other Ambulatory Visit (INDEPENDENT_AMBULATORY_CARE_PROVIDER_SITE_OTHER): Payer: Medicaid Other

## 2015-07-28 DIAGNOSIS — E039 Hypothyroidism, unspecified: Secondary | ICD-10-CM

## 2015-07-28 LAB — TSH: TSH: 0.94 u[IU]/mL (ref 0.35–4.50)

## 2015-07-29 LAB — T4, FREE: Free T4: 0.6 ng/dL (ref 0.60–1.60)

## 2015-08-02 ENCOUNTER — Encounter: Payer: Medicaid Other | Admitting: Endocrinology

## 2015-08-08 ENCOUNTER — Encounter: Payer: Self-pay | Admitting: Endocrinology

## 2015-08-08 ENCOUNTER — Ambulatory Visit (INDEPENDENT_AMBULATORY_CARE_PROVIDER_SITE_OTHER): Payer: Medicaid Other | Admitting: Endocrinology

## 2015-08-08 VITALS — BP 112/74 | HR 97 | Temp 98.4°F | Resp 14 | Ht 63.0 in | Wt 186.6 lb

## 2015-08-08 DIAGNOSIS — E041 Nontoxic single thyroid nodule: Secondary | ICD-10-CM | POA: Diagnosis not present

## 2015-08-08 DIAGNOSIS — E282 Polycystic ovarian syndrome: Secondary | ICD-10-CM

## 2015-08-08 DIAGNOSIS — E038 Other specified hypothyroidism: Secondary | ICD-10-CM

## 2015-08-08 DIAGNOSIS — E063 Autoimmune thyroiditis: Secondary | ICD-10-CM

## 2015-08-08 NOTE — Progress Notes (Signed)
Patient ID: Taylor Bennett, female   DOB: May 01, 1981, 34 y.o.   MRN: [id number]   Reason for Appointment: Followup   History of Present Illness:   1. Primary hypothyroidism was first diagnosed in 2012 with a TSH of 9.0 At that time she was being treated for a multinodular goiter with thyroxine suppression using 50 mcg Subsequently the patient's requirement for levothyroxine has increased and she had been taking 100 mcg  Because of her complaints of hair loss and persistent fatigue she was given a trial of Armour Thyroid With this change her energy level was overall improved She has been taking Armour Thyroid 60 mg alternating with 90 with consistent TSH levels         Compliance with the medical regimen has been as prescribed with taking the tablet in the morning before breakfast.  Recently does not complain of any unusual fatigue except related to traveling or stress. No significant hair loss recently.  She has a history of a thyroid nodule of 1 cm in size and also persistently enlargement of the right thyroid lobe. She is concerned about the nodule.   2. ? Polycystic ovarian syndrome She had PCOS diagnosed in 4/14 by another physician when her total testosterone level was 58.  Her only symptoms appeared to be hair loss and  previously irregular menstrual cycles which were regulated by birth control pills  Subsequently had been on metformin with improvement in her testosterone levels   However she was taking only 500 mg of metformin and with stopping this her testosterone levels were subsequently normal She still has regular menstrual cycles even though she is using a NuvaRing  Lab Results  Component Value Date   TSH 0.94 07/28/2015   TSH 0.962 07/19/2015   TSH 0.66 06/07/2014   FREET4 0.60 07/28/2015   FREET4 0.77 06/07/2014   FREET4 0.68 12/23/2013       Medication List       This list is accurate as of: 08/08/15  3:58 PM.  Always use your most recent med list.               etonogestrel-ethinyl estradiol 0.12-0.015 MG/24HR vaginal ring  Commonly known as:  [place] 1 each vaginally every 28 (twenty-eight) days. Insert vaginally and leave in place for 3 consecutive weeks, then remove for 1 week.     meclizine 25 MG tablet  Commonly known as:  ANTIVERT  Take 1 tablet (25 mg total) by mouth 3 (three) times daily as needed for dizziness.     metFORMIN 500 MG 24 hr tablet  Commonly known as:  GLUCOPHAGE-XR  Take one tablet 2 times daily     naproxen sodium 220 MG tablet  Commonly known as:  ANAPROX  Take 220 mg by mouth 2 (two) times daily with a meal.     OVER THE COUNTER MEDICATION  Total control Herbalife     rizatriptan 10 MG tablet  Commonly known as:  MAXALT  Take 10 mg by mouth as needed for migraine. May repeat in 2 hours if needed     thyroid 60 MG tablet  Commonly known as:  ARMOUR  Take 60 mg by mouth. Take 1 1/2 tablet daily alternating with 1 tablet     ARMOUR THYROID 60 MG tablet  Generic drug:  thyroid  TAKE ONE TABLET BY MOUTH ONCE DAILY BEFORE  BREAKFAST,  THEN TAKE  1  AND  1/2  TABLET  DAILY  ALTERNATING  WITH  1  TABLET        Past Medical History  Diagnosis Date  . PCOS (polycystic ovarian syndrome)   . Thyroid disease     No past surgical history on file.  Family History  Problem Relation Age of Onset  . Thyroid disease Mother   . Diabetes Father     Social History:  reports that she has never smoked. She has never used smokeless tobacco. She reports that she does not drink alcohol or use illicit drugs.  Allergies: No Known Allergies  REVIEW of systems:  Recently having problems with vertigo and is being treated with Antivert.  She is having increased bruising for the last month and she thinks her blood didn't clot as quickly when she had a blood draw  Migraines: Treated by PCP  Difficulty losing weight:  Wt Readings from Last 3 Encounters:  08/08/15 186 lb 9.6 oz (84.641 kg)  07/19/15 184  lb (83.462 kg)  06/10/14 176 lb 12.8 oz (80.196 kg)     Examination:   BP 112/74 mmHg  Pulse 97  Temp(Src) 98.4 F (36.9 C)  Resp 14  Ht 5\' 3"  (1.6 m)  Wt 186 lb 9.6 oz (84.641 kg)  BMI 33.06 kg/m2  SpO2 72%  LMP 07/12/2015   She looks well   Minimal hirsutism      NECK:  her thyroid is enlarged about twice normal on the right side relatively smooth and firm, left side not palpable.  No nodules felt  NEUROLOGIC EXAM:  reflexes normal bilaterally at biceps.     Assessments:    Hypothyroidism, likely to be from Hashimoto thyroiditis associated with a goiter Currently is on Armour Thyroid 60 alternating with 90 mg TSH has been consistently normal and she has no symptoms of Hyperthyroidism from the T3 component Her goiter is fairly stable and feels typical of Hashimoto's thyroiditis   Thyroid nodule: She is requesting a follow-up ultrasound, likely that the previous 1 cm nodule is insignificant   PLAN:   Continue same dosage of Armour Thyroid before breakfast daily.  Ultrasound ordered.   Malissie Musgrave 08/08/2015, 3:58 PM

## 2015-08-12 ENCOUNTER — Other Ambulatory Visit: Payer: Self-pay | Admitting: Endocrinology

## 2015-08-25 ENCOUNTER — Ambulatory Visit
Admission: RE | Admit: 2015-08-25 | Discharge: 2015-08-25 | Disposition: A | Discharge status: Home / Self Care | Payer: 59 | Source: Ambulatory Visit | Attending: Endocrinology | Admitting: Endocrinology

## 2015-08-25 ENCOUNTER — Other Ambulatory Visit: Payer: 59

## 2015-08-25 DIAGNOSIS — E041 Nontoxic single thyroid nodule: Secondary | ICD-10-CM

## 2015-08-25 IMAGING — US US SOFT TISSUE HEAD/NECK
1 series · 14 of 25 positions shown · non-contrast
Comparison: [DATE]

CLINICAL DATA: Follow-up nodule

EXAM:
THYROID ULTRASOUND
TECHNIQUE: Ultrasound examination of the thyroid gland and adjacent soft
tissues was performed.

[Series 1: us soft tissue head/neck · 0.08mm/px · 14 of 61 slices shown]
[im 1/61]
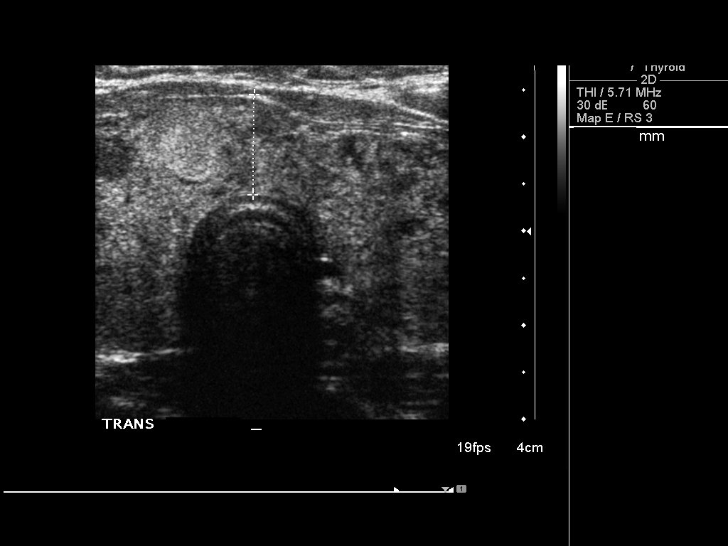
[im 6/61]
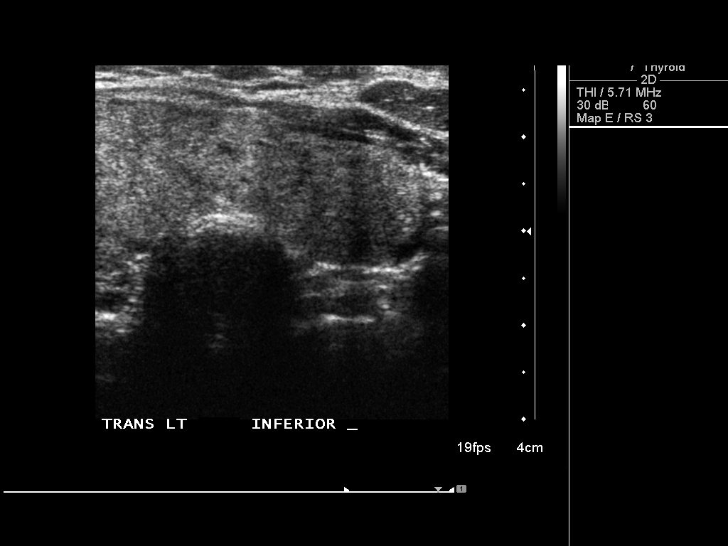
[im 11/61]
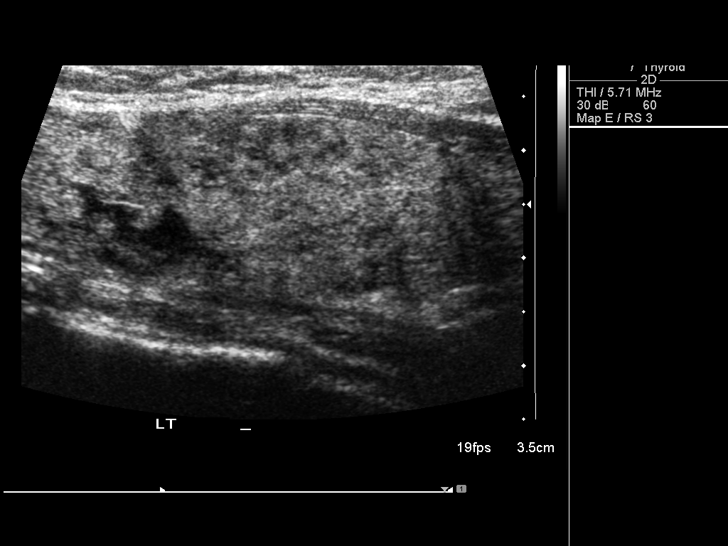
[im 16/61]
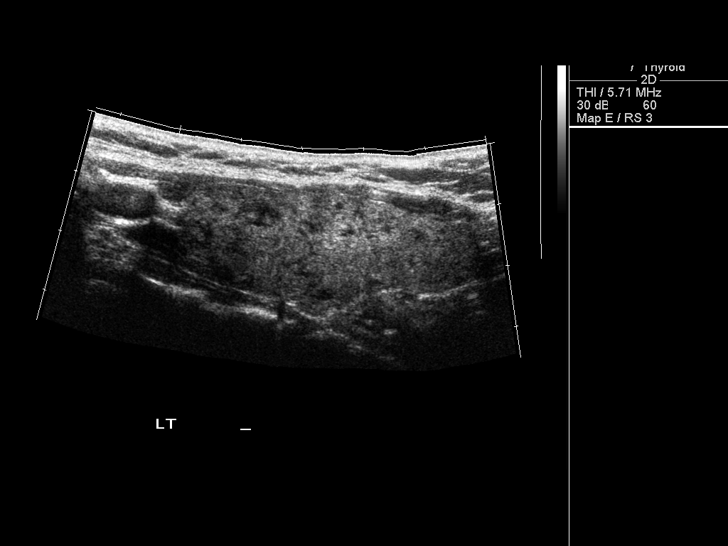
[im 21/61]
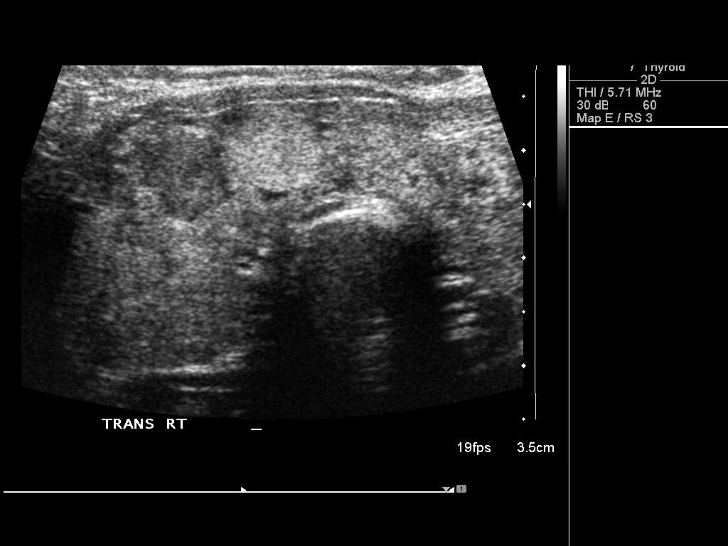
[im 23/61]
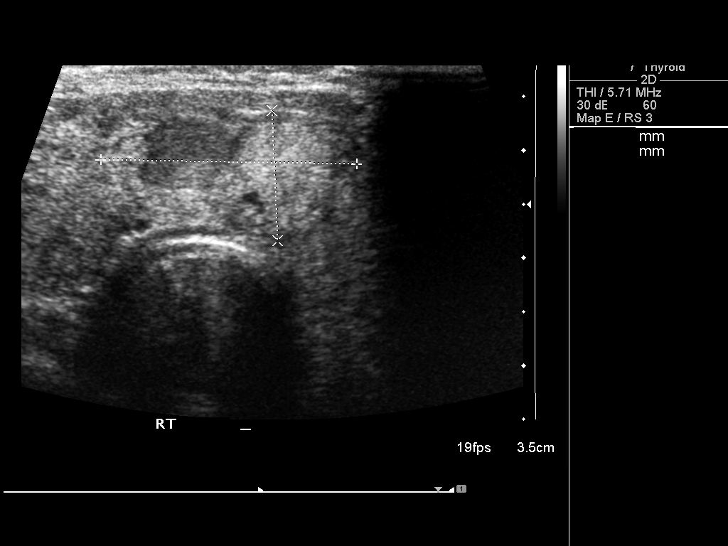
[im 28/61]
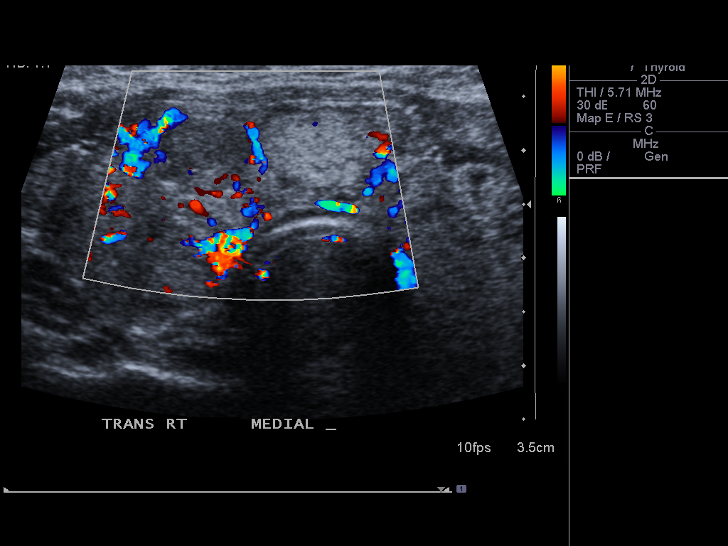
[im 33/61]
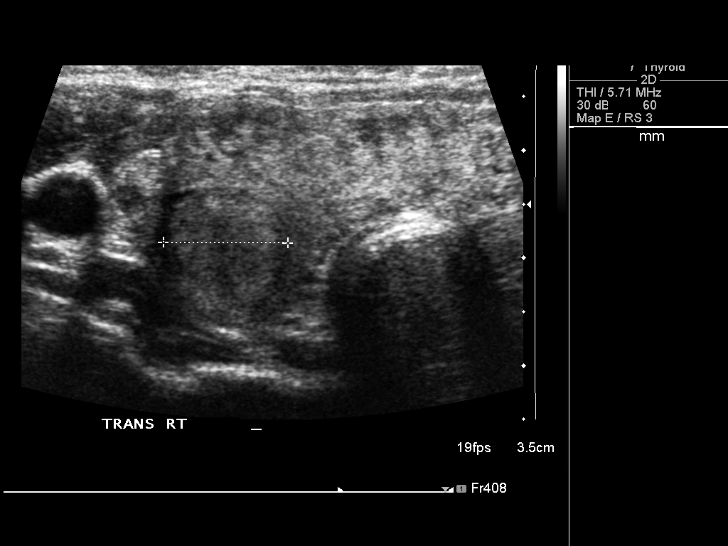
[im 38/61]
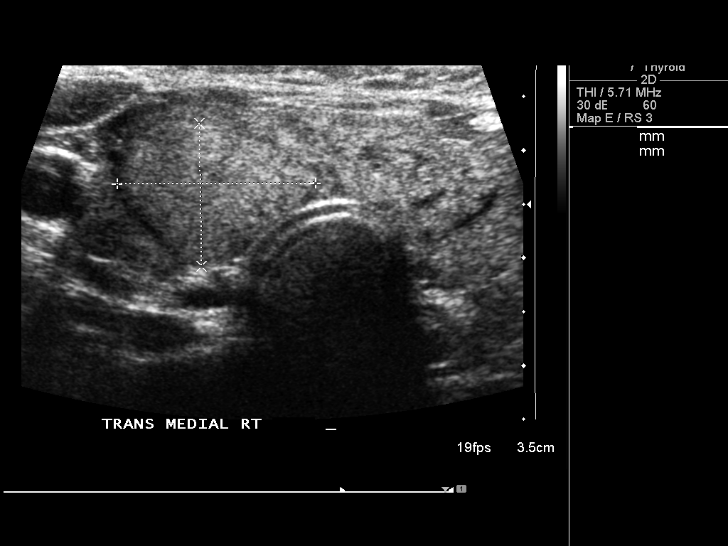
[im 41/61]
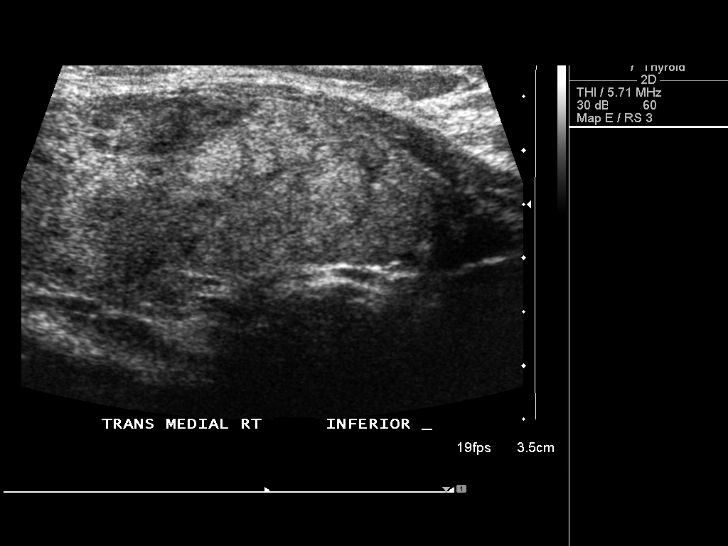
[im 46/61]
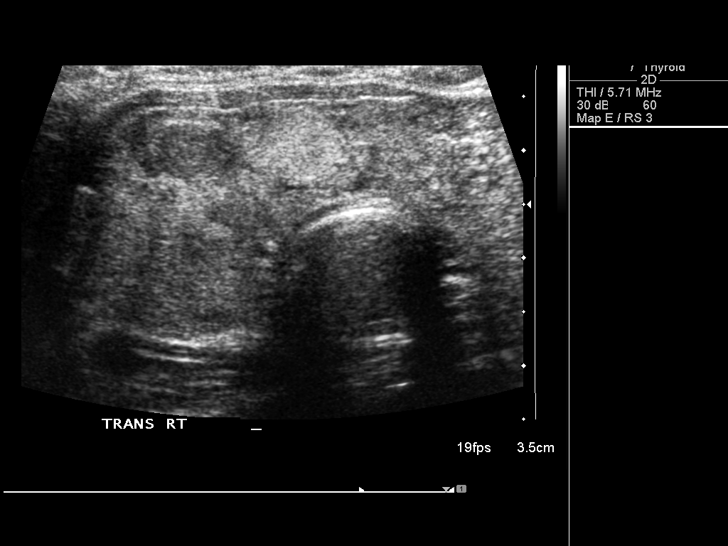
[im 51/61]
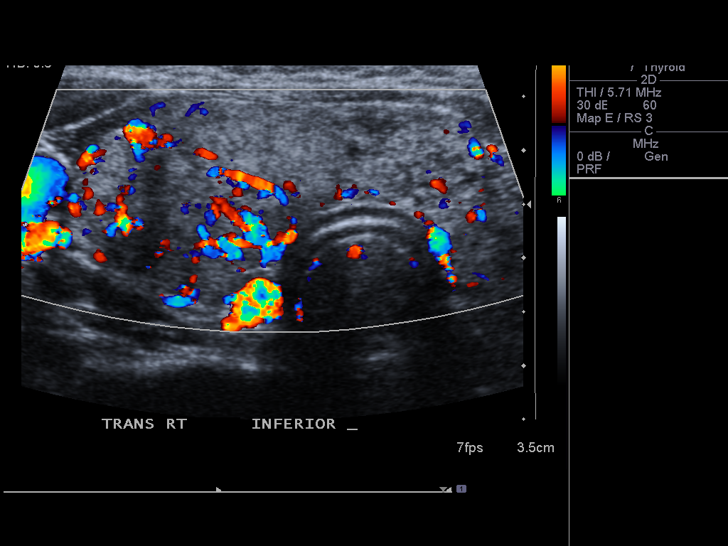
[im 56/61]
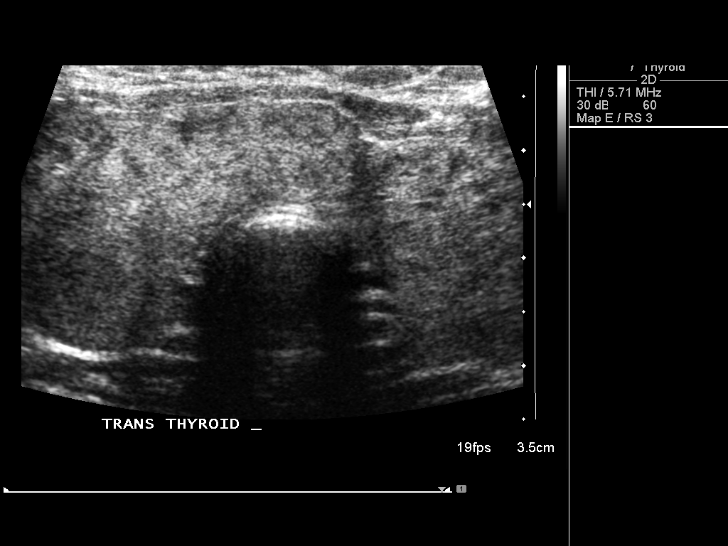
[im 61/61]
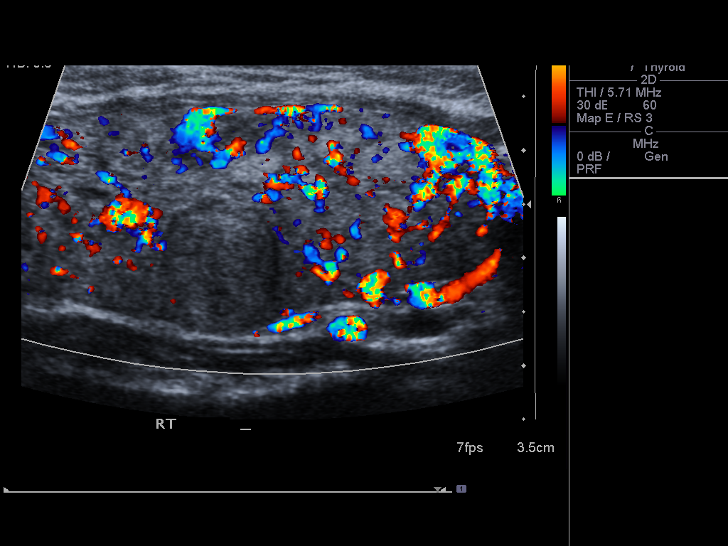

[14 of 25 positions shown; findings below may reference images not displayed]

FINDINGS: Right thyroid lobe

Measurements: 5.9 x 2.1 x 2.4 cm. Previously, only a mid lobe 10 mm
nodule was noted. Today, the gland remains heterogeneous and other
nodules are suspected. 2.4 x 1.0 x 1.8 cm mid lobe nodule versus
pseudo nodule. Adjacent medial 1.8 x 1.3 x 1.3 cm nodule versus
pseudo nodule. There is a third adjacent 14 x 12 x 12 mm nodule
versus pseudo nodule.

Left thyroid lobe

Measurements: 5.1 x 2.1 x 2.4 cm. Heterogeneous tissue without focal
nodule.

Isthmus

Thickness: 11 mm.  No nodules visualized.

Lymphadenopathy

None visualized.
IMPRESSION: There are now several nodules versus pseudo nodules in the right
lobe. The dominant 2.4 cm nodule meets criteria for fine needle
aspiration biopsy. Findings meet consensus criteria for biopsy.
Ultrasound-guided fine needle aspiration should be considered, as
per the consensus statement: Management of Thyroid Nodules Detected
at US: Society of Radiologists in Ultrasound Consensus Conference

## 2015-08-31 ENCOUNTER — Encounter: Payer: Self-pay | Admitting: Endocrinology

## 2015-08-31 ENCOUNTER — Telehealth: Payer: Self-pay | Admitting: *Deleted

## 2015-08-31 ENCOUNTER — Other Ambulatory Visit: Payer: Self-pay | Admitting: Endocrinology

## 2015-08-31 DIAGNOSIS — E041 Nontoxic single thyroid nodule: Secondary | ICD-10-CM

## 2015-08-31 NOTE — Telephone Encounter (Signed)
Discussed ultrasound with Dr. Pascal Lux, interventional radiologist.  He would prefer to examine the patient real-time on ultrasound before deciding on biopsy, this was scheduled and patient informed, likely has Hashimoto's thyroiditis.  Patient prefers to do a biopsy now

## 2015-08-31 NOTE — Telephone Encounter (Signed)
Please see email before from Mrs. Klinkner.  I just so happened to view my chart and see my ultrasound results a week after they have been finalized and I am a bit disturbed that I did not receive some kind of call to help me understand them and to set up the fine needle aspiration that is suggested. I see that there is a difference in the results from 2014 and now and it is saying that I need a biopsy. Can someone please call me back as soon as possible to address these results and tell me what needs to be done now? I am very concerned about this and don't want to enter my Christmas weekend not knowing.

## 2015-09-28 ENCOUNTER — Ambulatory Visit
Admission: RE | Admit: 2015-09-28 | Discharge: 2015-09-28 | Disposition: A | Discharge status: Home / Self Care | Payer: 59 | Source: Ambulatory Visit | Attending: Endocrinology | Admitting: Endocrinology

## 2015-09-28 ENCOUNTER — Other Ambulatory Visit (HOSPITAL_COMMUNITY)
Admission: RE | Admit: 2015-09-28 | Discharge: 2015-09-28 | Disposition: A | Discharge status: Home / Self Care | Payer: 59 | Source: Ambulatory Visit | Attending: Physician Assistant | Admitting: Physician Assistant

## 2015-09-28 DIAGNOSIS — E041 Nontoxic single thyroid nodule: Secondary | ICD-10-CM | POA: Insufficient documentation

## 2015-09-28 DIAGNOSIS — E0789 Other specified disorders of thyroid: Secondary | ICD-10-CM | POA: Diagnosis not present

## 2015-09-28 IMAGING — US US THYROID BIOPSY
1 series · 9 of 9 positions shown · non-contrast
Comparison: Ultrasound done [DATE]

CLINICAL DATA: A right mid lobe thyroid nodule measuring 2.4 x
x 1.8 cm. Request for fine needle aspirate biopsy.

EXAM:
ULTRASOUND GUIDED NEEDLE ASPIRATE BIOPSY OF THE THYROID GLAND

[Series 1: us thyroid biopsy · 0.08mm/px · 9 acquisitions, 9 frames shown]
[im 1/9]
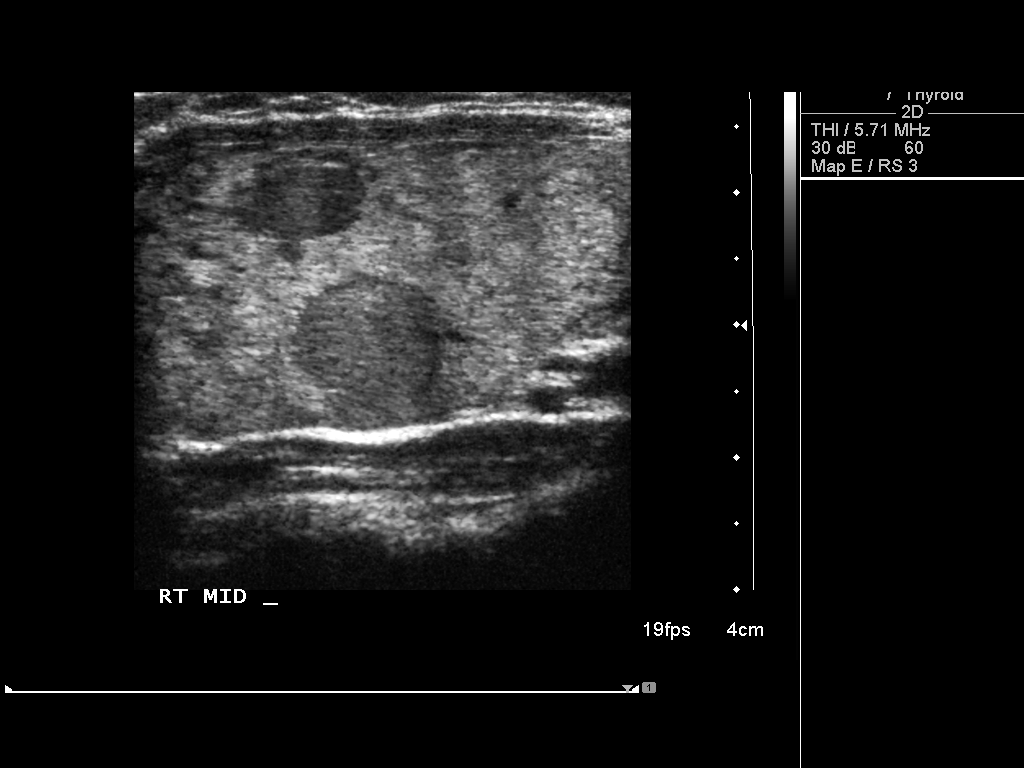
[im 2/9]
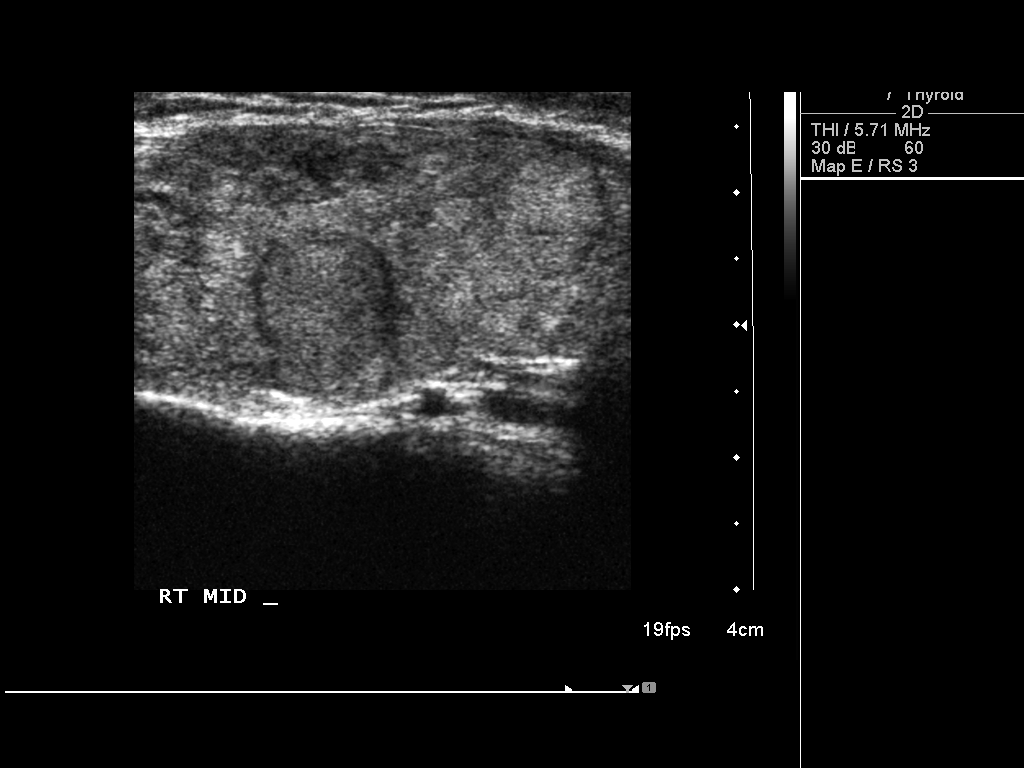
[im 3/9]
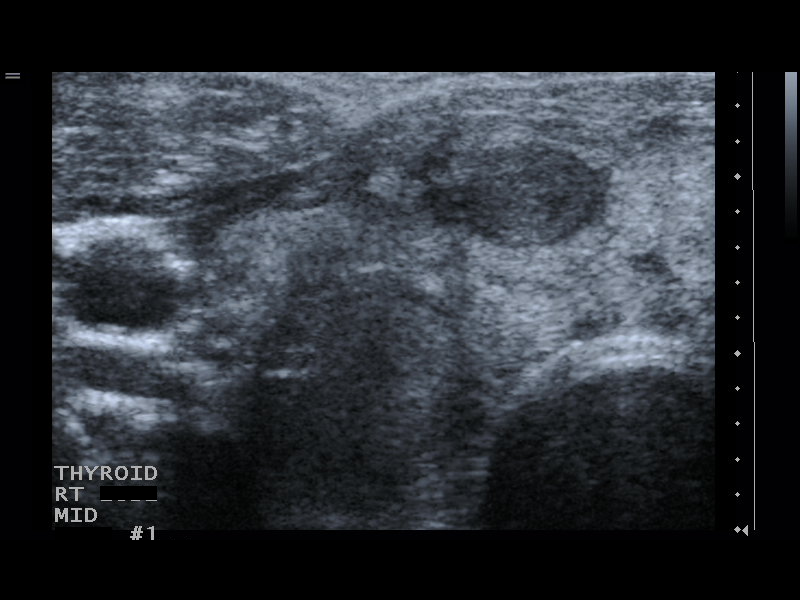
[im 4/9]
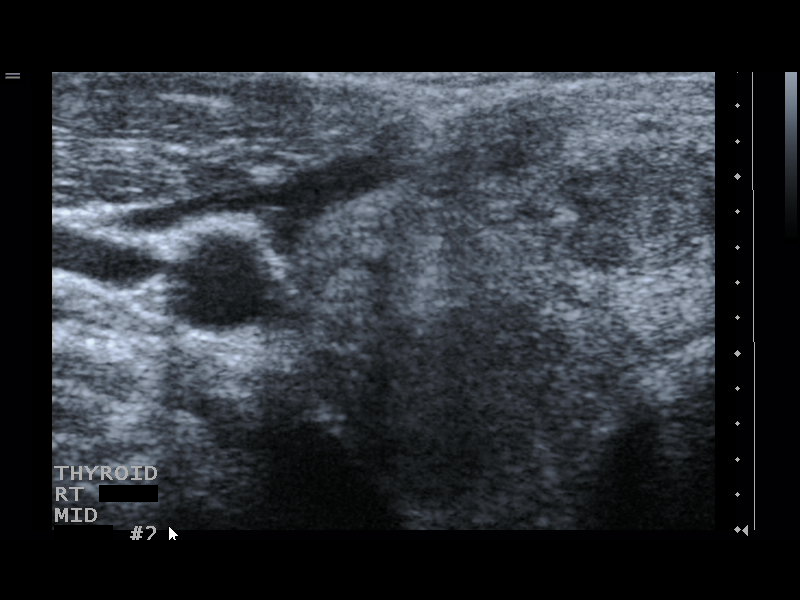
[im 5/9]
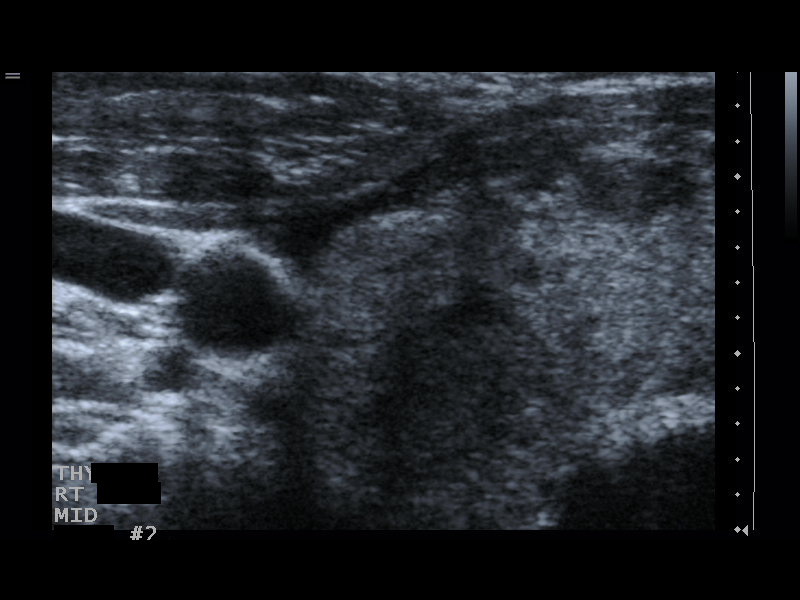
[im 6/9]
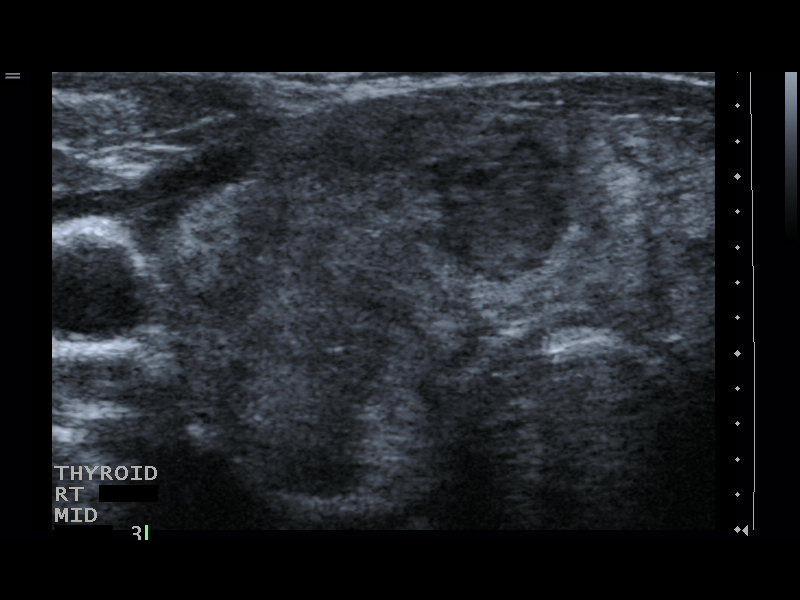
[im 7/9]
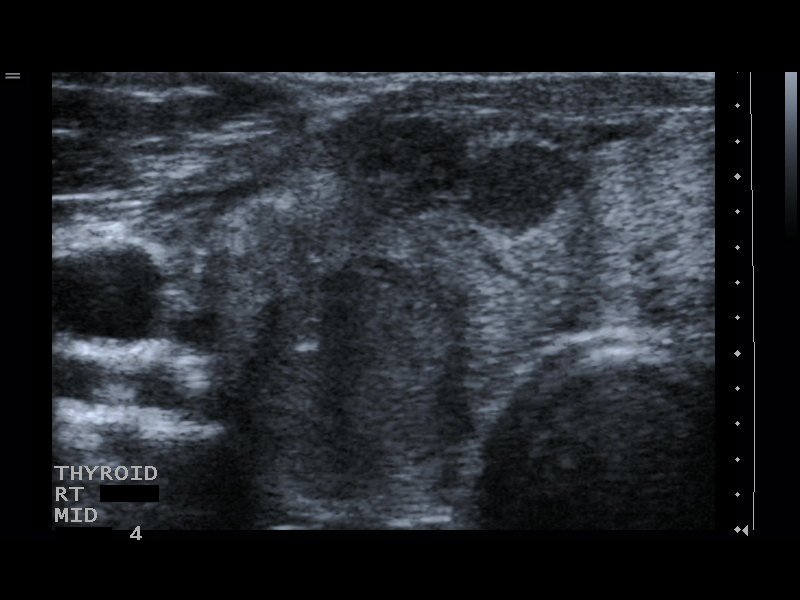
[im 8/9]
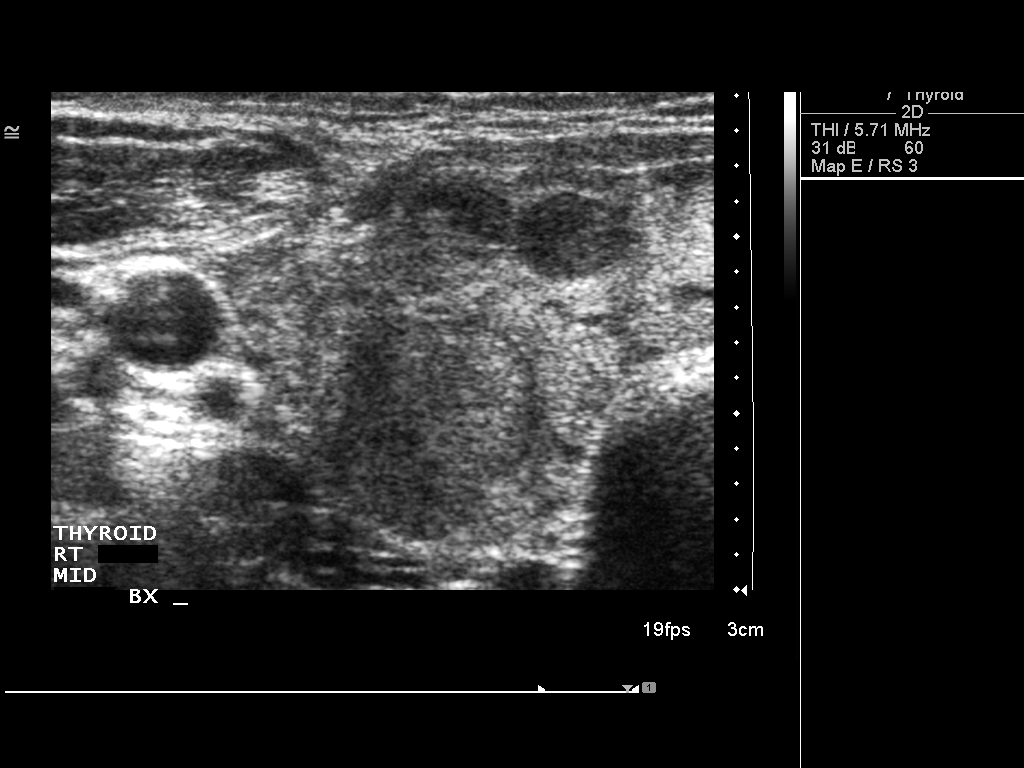
[im 9/9]
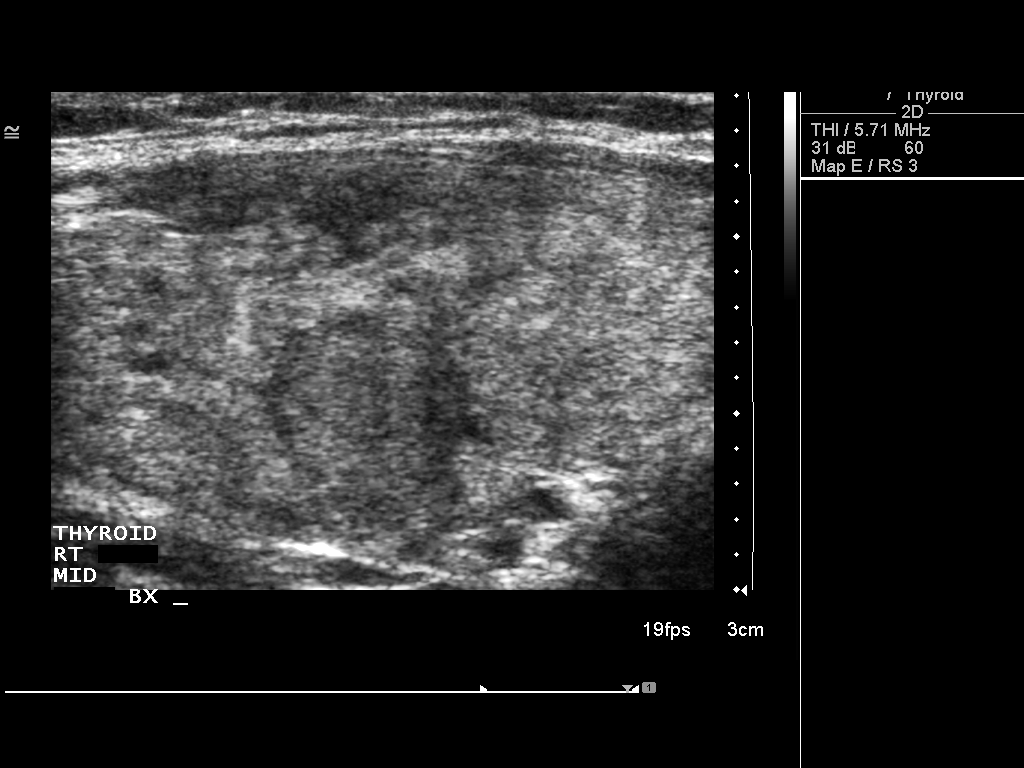

[9 of 9 positions shown; findings below may reference images not displayed]

PROCEDURE:
Thyroid biopsy was thoroughly discussed with the patient and
questions were answered. The benefits, risks, alternatives, and
complications were also discussed. The patient understands and
wishes to proceed with the procedure. Written consent was obtained.

Ultrasound was performed to localize and mark an adequate site for
the biopsy. The patient was then prepped and draped in a normal
sterile fashion. Local anesthesia was provided with 1% lidocaine.
Using direct ultrasound guidance, 4 passes were made using needles
into the nodule within the right lobe of the thyroid. Ultrasound was
used to confirm needle placements on all occasions. Specimens were
sent to Pathology for analysis.

Complications:  None immediate
FINDINGS: Imaging confirms needle placement into the right thyroid nodule
IMPRESSION: Ultrasound guided needle aspirate biopsy performed of the right
thyroid nodule.

## 2015-09-28 NOTE — Procedures (Signed)
Using direct ultrasound guidance, 4 passes were made using needles into the nodule within the right lobe of the thyroid.   Ultrasound was used to confirm needle placements on all occasions.   Specimens were sent to Pathology for analysis.   WENDY S BLAIR PA-C 07/05/2015 1:43 PM

## 2015-09-30 ENCOUNTER — Other Ambulatory Visit: Payer: Self-pay | Admitting: Endocrinology

## 2015-09-30 DIAGNOSIS — E041 Nontoxic single thyroid nodule: Secondary | ICD-10-CM

## 2015-10-03 ENCOUNTER — Telehealth: Payer: Self-pay | Admitting: Endocrinology

## 2015-10-03 NOTE — Telephone Encounter (Signed)
Noted  

## 2015-10-03 NOTE — Telephone Encounter (Signed)
done

## 2015-10-03 NOTE — Telephone Encounter (Signed)
Patient called and would like a to know if Dr. Dwyane Dee can release her biopsy results on MyChart   Thank you

## 2015-10-03 NOTE — Telephone Encounter (Signed)
Please see below and advise.

## 2015-10-12 ENCOUNTER — Other Ambulatory Visit (HOSPITAL_COMMUNITY)
Admission: RE | Admit: 2015-10-12 | Discharge: 2015-10-12 | Disposition: A | Discharge status: Home / Self Care | Payer: 59 | Source: Ambulatory Visit | Attending: Radiology | Admitting: Radiology

## 2015-10-12 ENCOUNTER — Ambulatory Visit
Admission: RE | Admit: 2015-10-12 | Discharge: 2015-10-12 | Disposition: A | Discharge status: Home / Self Care | Payer: 59 | Source: Ambulatory Visit | Attending: Endocrinology | Admitting: Endocrinology

## 2015-10-12 DIAGNOSIS — E041 Nontoxic single thyroid nodule: Secondary | ICD-10-CM | POA: Diagnosis not present

## 2015-10-12 DIAGNOSIS — Z0000 Encounter for general adult medical examination without abnormal findings: Secondary | ICD-10-CM | POA: Diagnosis not present

## 2015-10-12 DIAGNOSIS — R42 Dizziness and giddiness: Secondary | ICD-10-CM | POA: Diagnosis not present

## 2015-10-12 DIAGNOSIS — R51 Headache: Secondary | ICD-10-CM | POA: Diagnosis not present

## 2015-10-12 IMAGING — US US THYROID BIOPSY
1 series · 13 of 15 positions shown · non-contrast
Comparison: none

INDICATION: Patient with history of thyroid nodules and previous biopsy of a
dominant 2.4 cm right mid lobe nodule on [DATE]. Pathology
revealed atypia of undetermined significance vs possible follicular
neoplasm. Patient presents again today for repeat needle aspirate
biopsy of this dominant 2.4 cm right mid lobe thyroid nodule.

[Series 1: us thyroid biopsy · 0.07mm/px · 15 acquisitions, 13 frames shown]
[im 1/15]
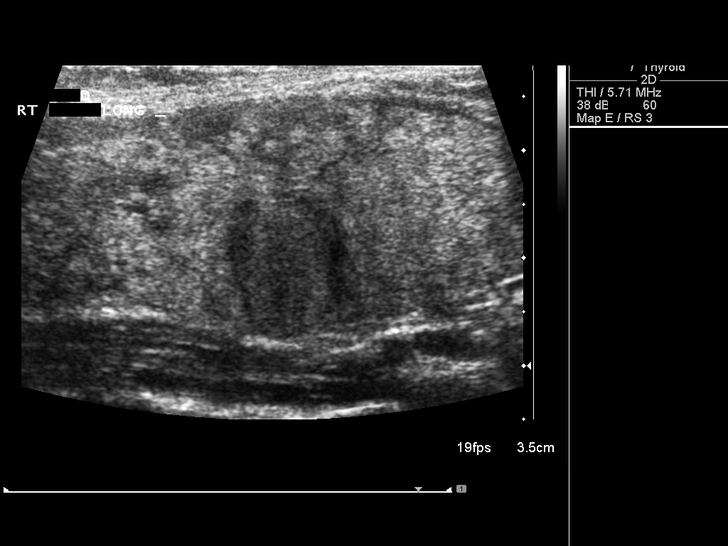
[im 2/15]
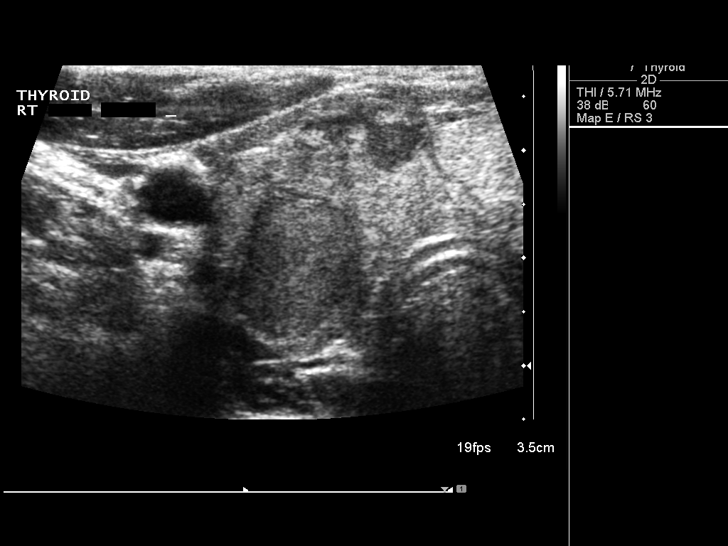
[im 3/15]
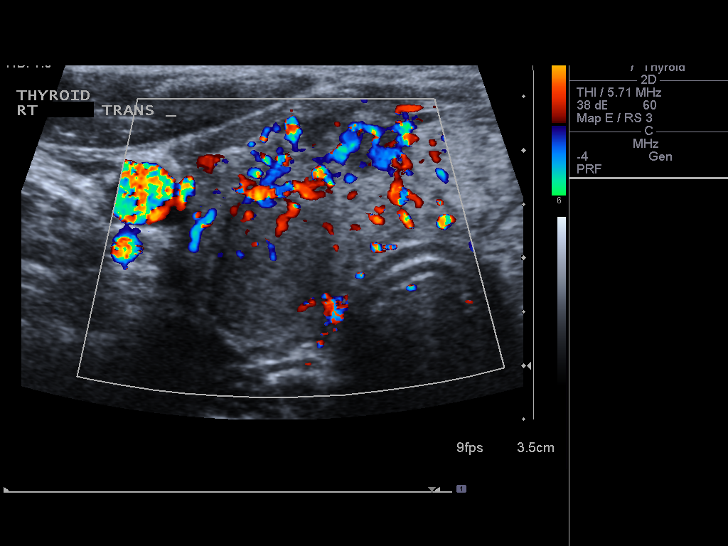
[im 5/15]
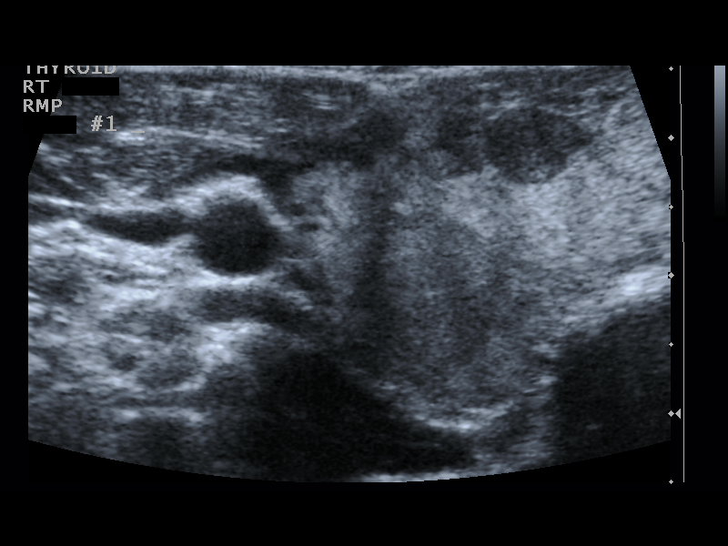
[im 6/15]
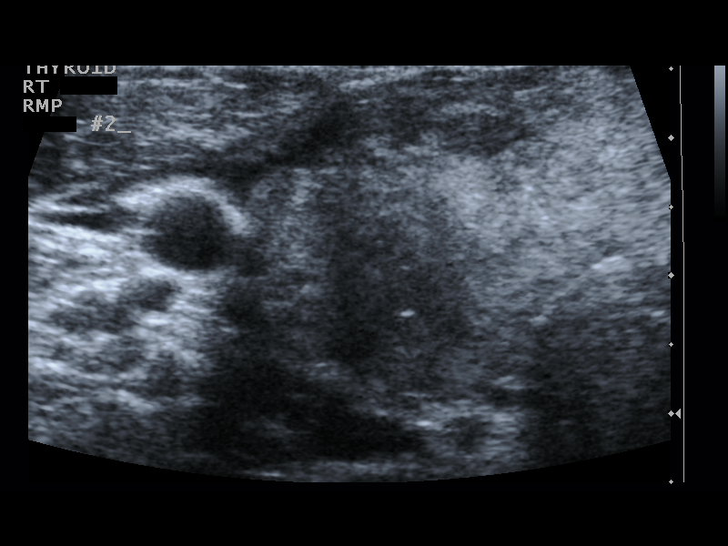
[im 7/15]
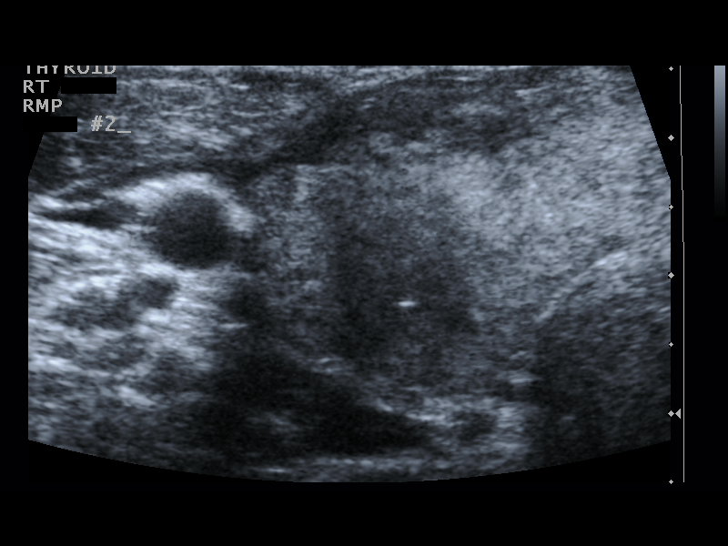
[im 8/15]
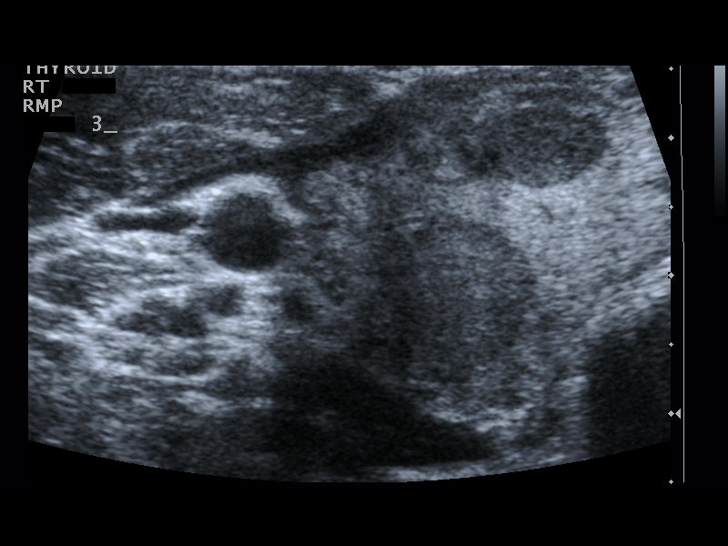
[im 9/15]
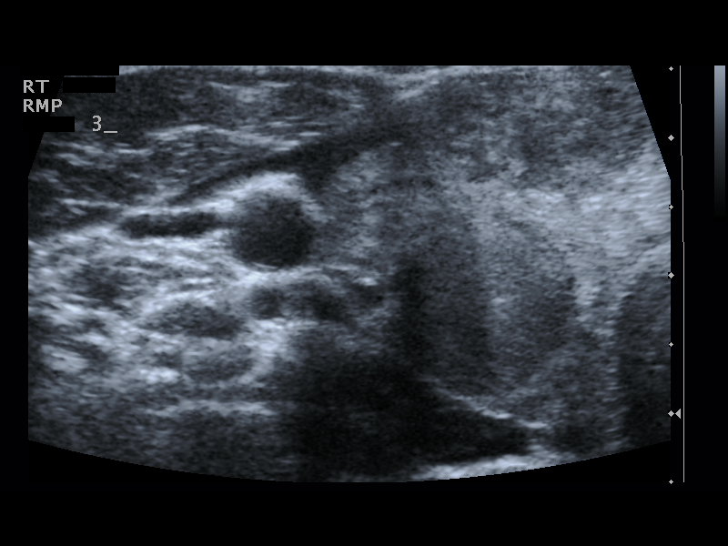
[im 10/15]
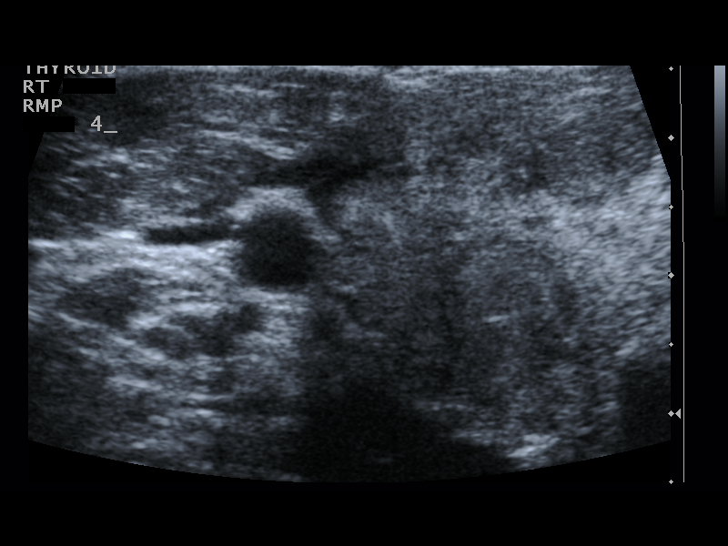
[im 11/15]
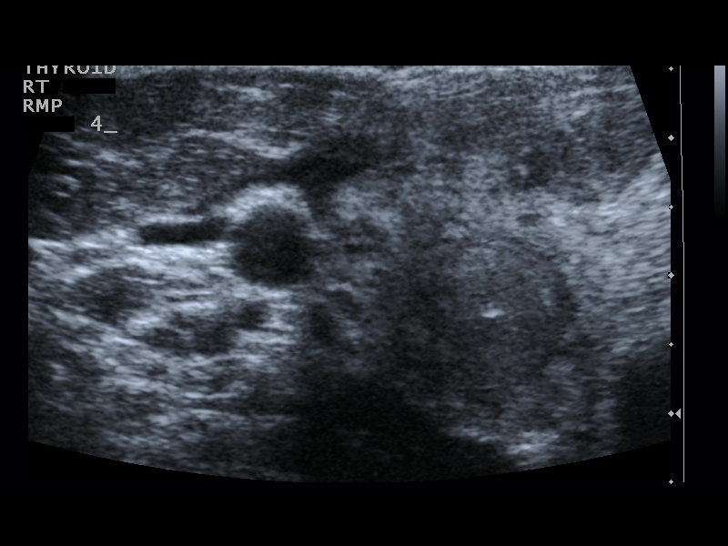
[im 13/15]
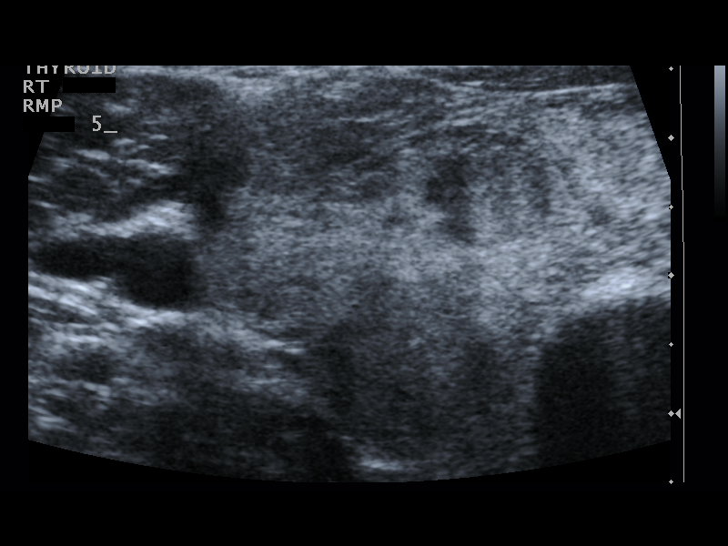
[im 14/15]
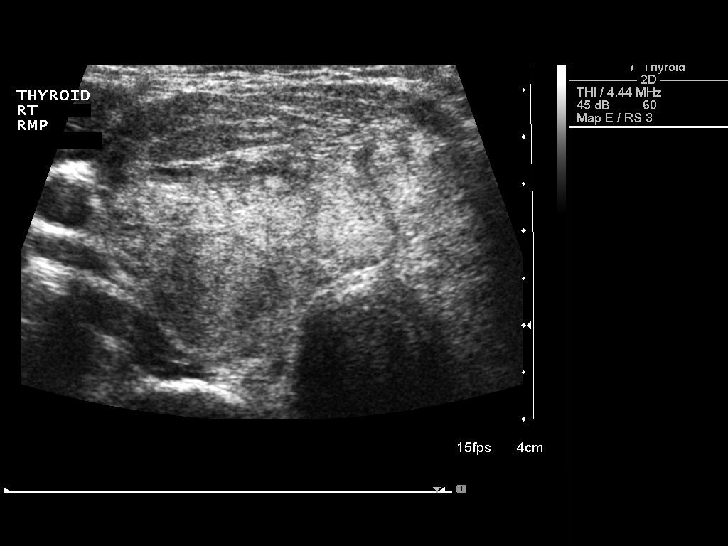
[im 15/15]
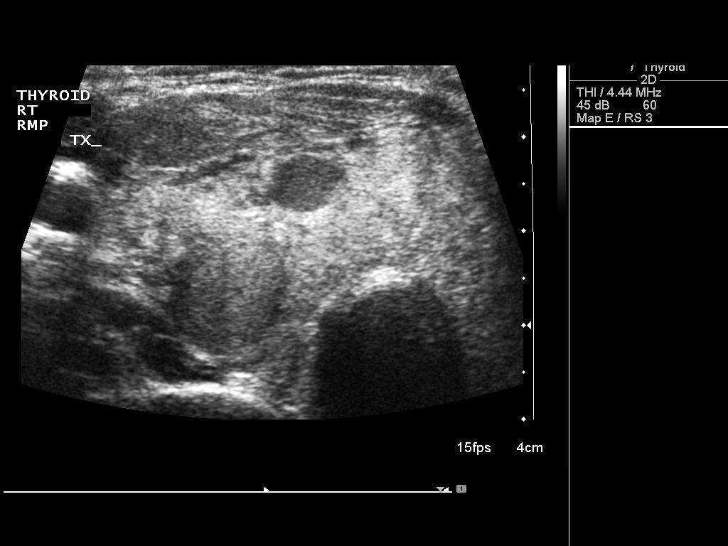

[13 of 15 positions shown; findings below may reference images not displayed]

EXAM:
ULTRASOUND GUIDED NEEDLE ASPIRATE BIOPSY OF DOMINANT RIGHT MIDPOLE
THYROID NODULE

MEDICATIONS:
None.

ANESTHESIA/SEDATION:
None

FLUOROSCOPY TIME:  None

COMPLICATIONS:
None immediate.

PROCEDURE:
Informed written consent was obtained from the patient after a
thorough discussion of the procedural risks, benefits and
alternatives. All questions were addressed. Maximal Sterile Barrier
Technique was utilized including caps, mask, sterile gowns, sterile
gloves, sterile drape, hand hygiene and skin antiseptic. A timeout
was performed prior to the initiation of the procedure.

Ultrasound was performed to localize and mark an adequate site for
the biopsy. The patient was then prepped and draped in a normal
sterile fashion. Local anesthesia was provided with 1% lidocaine.
Using direct ultrasound guidance, 5 passes were made using 25 gauge
needles into the dominant nodule within the right mid lobe of the
thyroid. Ultrasound was used to confirm needle placements on all
occasions. Specimens were sent to Pathology for analysis. Specimens
were also submitted for Afirma testing.
IMPRESSION: Ultrasound guided needle aspirate biopsy performed of a dominant
right midpole thyroid nodule.

## 2015-10-18 ENCOUNTER — Telehealth: Payer: Self-pay | Admitting: *Deleted

## 2015-10-18 NOTE — Telephone Encounter (Signed)
The cytology office called, on this patients Thyroid biopsy dated 02/01, the report doesn't say it was obtained from the Right Middle lobe, and it looks like the dx has changed, but that was an error, I think they are re-faxing a corrected report over.

## 2015-10-19 DIAGNOSIS — E041 Nontoxic single thyroid nodule: Secondary | ICD-10-CM | POA: Diagnosis not present

## 2015-10-26 DIAGNOSIS — E049 Nontoxic goiter, unspecified: Secondary | ICD-10-CM | POA: Diagnosis not present

## 2015-10-26 DIAGNOSIS — E042 Nontoxic multinodular goiter: Secondary | ICD-10-CM | POA: Diagnosis not present

## 2015-10-26 DIAGNOSIS — E063 Autoimmune thyroiditis: Secondary | ICD-10-CM | POA: Diagnosis not present

## 2015-10-26 DIAGNOSIS — E038 Other specified hypothyroidism: Secondary | ICD-10-CM | POA: Diagnosis not present

## 2015-10-26 DIAGNOSIS — E041 Nontoxic single thyroid nodule: Secondary | ICD-10-CM | POA: Diagnosis not present

## 2015-10-27 ENCOUNTER — Encounter (HOSPITAL_COMMUNITY): Payer: Self-pay

## 2015-10-28 ENCOUNTER — Encounter (HOSPITAL_COMMUNITY): Payer: Self-pay

## 2015-10-28 ENCOUNTER — Encounter: Payer: Self-pay | Admitting: Endocrinology

## 2015-10-31 ENCOUNTER — Telehealth: Payer: Self-pay | Admitting: Endocrinology

## 2015-10-31 NOTE — Telephone Encounter (Signed)
Pt needs the original copy of her Afirma Test sent to her, she said you may fax her a copy for the time being, [phone] and please mail original to her home address.  She stated the copy that was uploaded to mychart is not legible.  She said she would like for you to call her if you have any questions and when it is sent at work, [phone]

## 2015-10-31 NOTE — Telephone Encounter (Signed)
I spoke with Mrs. Taylor Bennett, a copy has been faxed to her and also mailed to her.

## 2015-11-02 ENCOUNTER — Telehealth: Payer: Self-pay | Admitting: Endocrinology

## 2015-11-02 NOTE — Telephone Encounter (Signed)
Patient returning your call, call her back at her work # [phone]

## 2015-11-03 NOTE — Telephone Encounter (Signed)
Patient was wondering if 2nd part of her Affirma test has been mailed off - please advise.

## 2015-11-03 NOTE — Telephone Encounter (Signed)
PT returning Rhonda's phone call.

## 2015-11-04 DIAGNOSIS — H16223 Keratoconjunctivitis sicca, not specified as Sjogren's, bilateral: Secondary | ICD-10-CM | POA: Diagnosis not present

## 2015-11-07 NOTE — Telephone Encounter (Signed)
Yes it was mailed to her.

## 2015-11-11 DIAGNOSIS — E041 Nontoxic single thyroid nodule: Secondary | ICD-10-CM | POA: Diagnosis not present

## 2015-11-24 ENCOUNTER — Telehealth: Payer: Self-pay | Admitting: Endocrinology

## 2015-11-24 NOTE — Telephone Encounter (Signed)
FYI  Please see below

## 2015-11-24 NOTE — Telephone Encounter (Signed)
Pt called wanting to let Dr. Dwyane Dee know that she went to [facility] and got a second opinion and Dr. Fredirick Maudlin there has decided to perform surgery to have her thyroid removed.  Surgery date is April 10th at [facility].

## 2015-12-12 DIAGNOSIS — R079 Chest pain, unspecified: Secondary | ICD-10-CM | POA: Diagnosis not present

## 2015-12-12 DIAGNOSIS — E282 Polycystic ovarian syndrome: Secondary | ICD-10-CM | POA: Diagnosis not present

## 2015-12-12 DIAGNOSIS — Z79899 Other long term (current) drug therapy: Secondary | ICD-10-CM | POA: Diagnosis not present

## 2015-12-12 DIAGNOSIS — G43909 Migraine, unspecified, not intractable, without status migrainosus: Secondary | ICD-10-CM | POA: Diagnosis not present

## 2015-12-12 DIAGNOSIS — Z791 Long term (current) use of non-steroidal anti-inflammatories (NSAID): Secondary | ICD-10-CM | POA: Diagnosis not present

## 2015-12-12 DIAGNOSIS — Z87891 Personal history of nicotine dependence: Secondary | ICD-10-CM | POA: Diagnosis not present

## 2015-12-12 DIAGNOSIS — M419 Scoliosis, unspecified: Secondary | ICD-10-CM | POA: Diagnosis not present

## 2015-12-12 DIAGNOSIS — E039 Hypothyroidism, unspecified: Secondary | ICD-10-CM | POA: Diagnosis not present

## 2015-12-12 DIAGNOSIS — E063 Autoimmune thyroiditis: Secondary | ICD-10-CM | POA: Diagnosis not present

## 2015-12-12 DIAGNOSIS — Z975 Presence of (intrauterine) contraceptive device: Secondary | ICD-10-CM | POA: Diagnosis not present

## 2015-12-19 DIAGNOSIS — Z791 Long term (current) use of non-steroidal anti-inflammatories (NSAID): Secondary | ICD-10-CM | POA: Diagnosis not present

## 2015-12-19 DIAGNOSIS — M419 Scoliosis, unspecified: Secondary | ICD-10-CM | POA: Diagnosis not present

## 2015-12-19 DIAGNOSIS — Z975 Presence of (intrauterine) contraceptive device: Secondary | ICD-10-CM | POA: Diagnosis not present

## 2015-12-19 DIAGNOSIS — E063 Autoimmune thyroiditis: Secondary | ICD-10-CM | POA: Diagnosis not present

## 2015-12-19 DIAGNOSIS — G43909 Migraine, unspecified, not intractable, without status migrainosus: Secondary | ICD-10-CM | POA: Diagnosis not present

## 2015-12-19 DIAGNOSIS — D34 Benign neoplasm of thyroid gland: Secondary | ICD-10-CM | POA: Diagnosis not present

## 2015-12-19 DIAGNOSIS — E041 Nontoxic single thyroid nodule: Secondary | ICD-10-CM | POA: Diagnosis not present

## 2015-12-19 DIAGNOSIS — E039 Hypothyroidism, unspecified: Secondary | ICD-10-CM | POA: Diagnosis not present

## 2015-12-19 DIAGNOSIS — Z79899 Other long term (current) drug therapy: Secondary | ICD-10-CM | POA: Diagnosis not present

## 2015-12-19 DIAGNOSIS — Z87891 Personal history of nicotine dependence: Secondary | ICD-10-CM | POA: Diagnosis not present

## 2015-12-19 DIAGNOSIS — E282 Polycystic ovarian syndrome: Secondary | ICD-10-CM | POA: Diagnosis not present

## 2015-12-20 DIAGNOSIS — E039 Hypothyroidism, unspecified: Secondary | ICD-10-CM | POA: Diagnosis not present

## 2015-12-20 DIAGNOSIS — M419 Scoliosis, unspecified: Secondary | ICD-10-CM | POA: Diagnosis not present

## 2015-12-20 DIAGNOSIS — E282 Polycystic ovarian syndrome: Secondary | ICD-10-CM | POA: Diagnosis not present

## 2015-12-20 DIAGNOSIS — Z791 Long term (current) use of non-steroidal anti-inflammatories (NSAID): Secondary | ICD-10-CM | POA: Diagnosis not present

## 2015-12-20 DIAGNOSIS — Z975 Presence of (intrauterine) contraceptive device: Secondary | ICD-10-CM | POA: Diagnosis not present

## 2015-12-20 DIAGNOSIS — Z87891 Personal history of nicotine dependence: Secondary | ICD-10-CM | POA: Diagnosis not present

## 2015-12-20 DIAGNOSIS — Z79899 Other long term (current) drug therapy: Secondary | ICD-10-CM | POA: Diagnosis not present

## 2015-12-20 DIAGNOSIS — G43909 Migraine, unspecified, not intractable, without status migrainosus: Secondary | ICD-10-CM | POA: Diagnosis not present

## 2015-12-20 DIAGNOSIS — E063 Autoimmune thyroiditis: Secondary | ICD-10-CM | POA: Diagnosis not present

## 2016-01-06 DIAGNOSIS — H5213 Myopia, bilateral: Secondary | ICD-10-CM | POA: Diagnosis not present

## 2016-01-06 DIAGNOSIS — H16223 Keratoconjunctivitis sicca, not specified as Sjogren's, bilateral: Secondary | ICD-10-CM | POA: Diagnosis not present

## 2016-01-06 DIAGNOSIS — H40033 Anatomical narrow angle, bilateral: Secondary | ICD-10-CM | POA: Diagnosis not present

## 2016-01-09 DIAGNOSIS — H16223 Keratoconjunctivitis sicca, not specified as Sjogren's, bilateral: Secondary | ICD-10-CM | POA: Diagnosis not present

## 2016-02-01 DIAGNOSIS — E890 Postprocedural hypothyroidism: Secondary | ICD-10-CM | POA: Diagnosis not present

## 2016-02-09 ENCOUNTER — Other Ambulatory Visit: Payer: Self-pay | Admitting: Endocrinology

## 2016-03-08 ENCOUNTER — Other Ambulatory Visit (HOSPITAL_COMMUNITY)
Admission: RE | Admit: 2016-03-08 | Discharge: 2016-03-08 | Disposition: A | Discharge status: Home / Self Care | Payer: 59 | Source: Ambulatory Visit | Attending: Obstetrics and Gynecology | Admitting: Obstetrics and Gynecology

## 2016-03-08 ENCOUNTER — Other Ambulatory Visit: Payer: Self-pay | Admitting: Obstetrics and Gynecology

## 2016-03-08 DIAGNOSIS — Z1151 Encounter for screening for human papillomavirus (HPV): Secondary | ICD-10-CM | POA: Insufficient documentation

## 2016-03-08 DIAGNOSIS — Z01419 Encounter for gynecological examination (general) (routine) without abnormal findings: Secondary | ICD-10-CM | POA: Diagnosis not present

## 2016-03-08 DIAGNOSIS — Z3044 Encounter for surveillance of vaginal ring hormonal contraceptive device: Secondary | ICD-10-CM | POA: Diagnosis not present

## 2016-03-08 DIAGNOSIS — R350 Frequency of micturition: Secondary | ICD-10-CM | POA: Diagnosis not present

## 2016-03-12 LAB — CYTOLOGY - PAP

## 2016-05-28 MED FILL — MELOXICAM 15 MG TABLET: 15 | 30 days supply | Qty: 30 | Fill #0

## 2016-05-30 DIAGNOSIS — M549 Dorsalgia, unspecified: Secondary | ICD-10-CM | POA: Diagnosis not present

## 2016-05-30 DIAGNOSIS — M419 Scoliosis, unspecified: Secondary | ICD-10-CM | POA: Diagnosis not present

## 2016-05-30 MED FILL — predniSONE 20 MG TABS: 20 | 9 days supply | Qty: 18 | Fill #0

## 2016-05-30 MED FILL — METAXALONE 800 MG TABLET: 800 | 10 days supply | Qty: 30 | Fill #0

## 2016-06-04 MED FILL — ARMOUR THYROID 60 MG TABLET: 60 | 30 days supply | Qty: 38 | Fill #0

## 2016-06-04 MED FILL — NUVARING VAGINAL RING: 0.12-0.015 | 84 days supply | Qty: 3 | Fill #0

## 2016-06-04 MED FILL — RIZATRIPTAN 10 MG TABLET: 10 | 30 days supply | Qty: 9 | Fill #0

## 2016-06-21 ENCOUNTER — Ambulatory Visit (INDEPENDENT_AMBULATORY_CARE_PROVIDER_SITE_OTHER): Payer: 59 | Admitting: Specialist

## 2016-06-21 DIAGNOSIS — M47815 Spondylosis without myelopathy or radiculopathy, thoracolumbar region: Secondary | ICD-10-CM

## 2016-06-21 DIAGNOSIS — M4125 Other idiopathic scoliosis, thoracolumbar region: Secondary | ICD-10-CM

## 2016-07-10 ENCOUNTER — Ambulatory Visit: Payer: 59 | Attending: Specialist | Admitting: Physical Therapy

## 2016-07-10 DIAGNOSIS — M6281 Muscle weakness (generalized): Secondary | ICD-10-CM | POA: Insufficient documentation

## 2016-07-10 DIAGNOSIS — M545 Low back pain: Secondary | ICD-10-CM | POA: Insufficient documentation

## 2016-07-10 DIAGNOSIS — G8929 Other chronic pain: Secondary | ICD-10-CM | POA: Diagnosis not present

## 2016-07-10 NOTE — Patient Instructions (Signed)
Bridge    Lie back, legs bent. Inhale, pressing hips up. Keeping ribs in, lengthen lower back. Exhale, rolling down along spine from top. Repeat _15___ times. Do __1__ sessions per day. 5 second hold    Abduction: Side Leg Lift (Eccentric) - Side-Lying    Lie on side. Lift top leg slightly higher than shoulder level. Keep top leg straight with body, toes pointing forward. Slowly lower for 3-5 seconds. _15__ reps per set, _1__ sets per day, _5_ days per week.  Piriformis Stretch    Lying on back, pull right knee toward opposite shoulder. Hold __30__ seconds. Repeat __3__ times. Do __1__ sessions per day.   PELVIC TILT: Posterior    Tighten abdominals, flatten low back. _15__ reps per set, _2__ sets per day, _5__ days per week Start with legs straight, progress to legs bent, progress to arms overhead.    Clam Shell 45 Degrees    Lying with hips and knees bent 45, one pillow between knees and ankles. Lift knee. Be sure pelvis does not roll backward. Do not arch back. Do _15__ times, each leg, _1__ times per day.

## 2016-07-10 NOTE — Therapy (Signed)
[facility] [address]  [place], [place], [address] Phone: [phone]   Fax:  [phone]  Physical Therapy Evaluation  Patient Details  Name: Niria Bily MRN: [id number] Date of Birth: 03-26-1981 Referring Provider: Dr. Louanne Skye  Encounter Date: 07/10/2016      PT End of Session - 07/10/16 1557    Visit Number 1   Number of Visits 6   Date for PT Re-Evaluation 10/02/16   PT Start Time [id number]   PT Stop Time 1528   PT Time Calculation (min) 43 min   Activity Tolerance Patient tolerated treatment well   Behavior During Therapy [facility] for tasks assessed/performed      Past Medical History:  Diagnosis Date  . PCOS (polycystic ovarian syndrome)   . Thyroid disease     No past surgical history on file.  There were no vitals filed for this visit.       Subjective Assessment - 07/10/16 1445    Subjective Patient reporting she has a "terrible back" - scoliosis and degenerative discs. Patient reporting "non-surgical bone fusion in cervical and lumbar spine from my degenertive disc disease." Had has to take out FMLA in Sept. due to severe low back pain. Was prescibed meds (prednisone) which has helped to an extent.    Limitations Sitting;Standing;Walking   How long can you sit comfortably? 10 minutes   How long can you stand comfortably? 10 minutes   How long can you walk comfortably? has refrained due to back pain   Diagnostic tests x-ray with patient reporting disc degeneration   Patient Stated Goals "minimize back spasm and return to exercising"   Currently in Pain? Yes   Pain Score 4    Pain Location Back   Pain Orientation Lower   Pain Descriptors / Indicators Aching   Pain Type Chronic pain   Pain Radiating Towards intermittent into B LE    Pain Onset More than a month ago   Pain Frequency Constant   Aggravating Factors  bending, lifting, twisting, pushing   Pain Relieving Factors rest, meds   Effect of  Pain on Daily Activities light duty at work            [facility] PT Assessment - 07/10/16 1459      Assessment   Medical Diagnosis scoliosis, lumbar back pain, thoracic back pain   Referring Provider Dr. Louanne Skye   Onset Date/Surgical Date --  May 2017   Next MD Visit 08/09/16   Prior Therapy no     Precautions   Precaution Comments light duty at work     Balance Screen   Has the patient fallen in the past 6 months No   Has the patient had a decrease in activity level because of a fear of falling?  No   Is the patient reluctant to leave their home because of a fear of falling?  No     Home Environment   Living Environment Private residence   Living Arrangements Children   Type of [place] to enter   Entrance Stairs-Number of Steps 5   Entrance Stairs-Rails Can reach both   [place] One level   Additional Comments has used RW with flare up     Prior Function   Level of Independence Independent   Vocation Full time employment  light duty   Engineer, manufacturing work, ED admissions registration, walking, bending   Leisure exercising, walking dog  Cognition   Overall Cognitive Status Within Functional Limits for tasks assessed     Observation/Other Assessments   Focus on Therapeutic Outcomes (FOTO)  52 (limited 48%, predicted 36% limited)     ROM / Strength   AROM / PROM / Strength AROM;Strength     AROM   Overall AROM Comments patient apprehensive with all movements, reporitng overall discomfort as well as L sided stiffness   AROM Assessment Site Lumbar   Lumbar Flexion fingertips to mid shins   Lumbar Extension 75% limited; patient apprehensive to movement   Lumbar - Right Side Bend fingertips to joint line   Lumbar - Left Side Bend fingertips to joint line    Lumbar - Right Rotation 50% limited   Lumbar - Left Rotation 50% limited     Strength   Strength Assessment Site Hip;Knee   Right/Left Hip Right;Left   Right Hip Flexion  4/5   Right Hip Extension 3+/5   Right Hip ABduction 3+/5   Right Hip ADduction 3/5   Left Hip Flexion 4/5   Left Hip Extension 3+/5   Left Hip ABduction 3+/5   Left Hip ADduction 3/5   Right/Left Knee Right;Left   Right Knee Flexion 4/5   Right Knee Extension 4/5   Left Knee Flexion 4/5   Left Knee Extension 4/5     Flexibility   Soft Tissue Assessment /Muscle Length yes   Hamstrings R LE tightness to approx 70 deg.    Piriformis B tightness     Palpation   Palpation comment patient tender to palpation over B glut region and R greater trochanter                   [facility] Adult PT Treatment/Exercise - 07/10/16 1736      Exercises   Exercises Lumbar;Knee/Hip     Lumbar Exercises: Supine   Ab Set 15 reps;5 seconds   AB Set Limitations difficulty with core activation; VC for proper muscle control     Knee/Hip Exercises: Stretches   Piriformis Stretch Both;30 seconds   Piriformis Stretch Limitations 3 way     Knee/Hip Exercises: Supine   Bridges Both;15 reps     Knee/Hip Exercises: Sidelying   Hip ABduction Both;15 reps   Hip ABduction Limitations pain with R sidelying   Clams Both; 15 reps                PT Education - 07/10/16 1530    Education provided Yes   Education Details iniital HEP with handout given   Person(s) Educated Patient   Methods Explanation;Demonstration;Handout   Comprehension Verbalized understanding;Returned demonstration          PT Short Term Goals - 07/10/16 1739      PT SHORT TERM GOAL #1   Title Patient to be independent with HEP (08/07/16)   Time 4   Period Weeks   Status New           PT Long Term Goals - 07/10/16 1740      PT LONG TERM GOAL #1   Title Patient to be independent with advanced HEP (10/02/16)   Time 12   Period Months   Status New     PT LONG TERM GOAL #2   Title Patient to improve B hip strength to >/= 4/5 (10/02/16)   Time 12   Period Weeks   Status New     PT LONG TERM GOAL #3    Title Patient to improve walking  tolerance to >30 minutes without increased pain to facilitate return to exericse program (10/02/16)   Time 12   Period Weeks   Status New     PT LONG TERM GOAL #4   Title Patient to report back pain </= 2/10 for greater than 2 weeks (10/03/15)   Time 12   Period Weeks   Status New     PT LONG TERM GOAL #5   Title Patient to demonstrate proper postural awareness with appropriate core activiation to reduce stress on low back for pain reduction (10/03/15)   Time 12   Period Weeks   Status New               Plan - 07/10/16 1602    Clinical Impression Statement Patient is a 35 y/o F presenting to OPPT today with primary complaints of low back pain and muscle spasms she relates to her prior diagnosis of scoliosis. Patient today demonstrating decreased B hip strength with reduced core activation needed for overall stability of lumbar spine likely increasing stress placed on low back. PT recommending patient for 2x/week, however, due to patinets work scheduling, patient only able to attend sessions biweekly. Patient to benefit from skilled PT intervention to address hip and core strength, promote proper postural awareness, as well as allow patient return to normal exercise regimen without apprehension for reinjury or pain.    Rehab Potential Good   PT Frequency Biweekly   PT Duration 12 weeks   PT Treatment/Interventions ADLs/Self Care Home Management;Cryotherapy;Electrical Stimulation;Moist Heat;Ultrasound;Therapeutic exercise;Therapeutic activities;Patient/family education;Manual techniques;Taping;Dry needling;Iontophoresis 4mg /ml Dexamethasone   PT Next Visit Plan progress hip and core strength   Consulted and Agree with Plan of Care Patient      Patient will benefit from skilled therapeutic intervention in order to improve the following deficits and impairments:  Decreased activity tolerance, Decreased range of motion, Decreased mobility, Decreased  strength, Difficulty walking, Increased muscle spasms, Pain  Visit Diagnosis: Chronic low back pain without sciatica, unspecified back pain laterality - Plan: PT plan of care cert/re-cert  Muscle weakness (generalized) - Plan: PT plan of care cert/re-cert     Problem List Patient Active Problem List   Diagnosis Date Noted  . Thyroid activity decreased 06/10/2014  . PCOS (polycystic ovarian syndrome) 06/16/2013  . Migraines 06/16/2013  . Unspecified hypothyroidism 06/10/2013      Lanney Gins, PT, DPT 07/10/16 5:47 PM     [facility] [address]  [place], [place], [address] Phone: [phone]   Fax:  [phone]  Name: Zaydah Sansing MRN: [id number] Date of Birth: 07-03-1981

## 2016-07-17 ENCOUNTER — Ambulatory Visit: Payer: 59 | Admitting: Physical Therapy

## 2016-07-24 ENCOUNTER — Ambulatory Visit: Payer: 59 | Attending: Specialist | Admitting: Physical Therapy

## 2016-07-24 DIAGNOSIS — G8929 Other chronic pain: Secondary | ICD-10-CM | POA: Diagnosis not present

## 2016-07-24 DIAGNOSIS — M545 Low back pain: Secondary | ICD-10-CM | POA: Diagnosis not present

## 2016-07-24 DIAGNOSIS — M6281 Muscle weakness (generalized): Secondary | ICD-10-CM | POA: Insufficient documentation

## 2016-07-24 NOTE — Patient Instructions (Addendum)

## 2016-07-24 NOTE — Therapy (Signed)
[facility] [address]  [place], [place], [address] Phone: [phone]   Fax:  [phone]  Physical Therapy Treatment  Patient Details  Name: Taylor Bennett MRN: [id number] Date of Birth: 1981-08-18 Referring Provider: Dr. Louanne Skye  Encounter Date: 07/24/2016      PT End of Session - 07/24/16 1716    Visit Number 2   Number of Visits 6   Date for PT Re-Evaluation 10/02/16   PT Start Time 1707  Pt arrived late   PT Stop Time 1747   PT Time Calculation (min) 40 min   Activity Tolerance Patient tolerated treatment well   Behavior During Therapy [facility] for tasks assessed/performed      Past Medical History:  Diagnosis Date  . PCOS (polycystic ovarian syndrome)   . Thyroid disease     No past surgical history on file.  There were no vitals filed for this visit.      Subjective Assessment - 07/24/16 1713    Subjective Pt reports she was able to complete the HEP 3x last week before she "put her back out" while lifting her 22# nephew.   Patient Stated Goals "minimize back spasm and return to exercising"   Currently in Pain? Yes   Pain Score 3    Pain Location Back  & buttock   Pain Orientation Lower;Right                [facility] Adult PT Treatment/Exercise - 07/24/16 1707      Self-Care   Self-Care Posture   Posture Education in proper posture and body mechanics for daily tasks      Lumbar Exercises: Stretches   Single Knee to Chest Stretch 2 reps;30 seconds   Piriformis Stretch 4 reps;30 seconds   Piriformis Stretch Limitations 2 reps each - KTOS & figure 4     Lumbar Exercises: Aerobic   Tread Mill 1.2 mph x 5'     Lumbar Exercises: Supine   Ab Set 15 reps;5 seconds   AB Set Limitations cues to slow pace and increase hold times   Bridge 10 reps;5 seconds   Bridge Limitations stopped after 8 reps d/t increased pain     Lumbar Exercises: Sidelying   Clam 10 reps;3 seconds   Hip Abduction 10  reps;3 seconds                PT Education - 07/24/16 1746    Education provided Yes   Education Details Clarification of initial HEP + addition of SKTC stretch and other options for piriformis stretch   Person(s) Educated Patient   Methods Explanation;Demonstration;Handout   Comprehension Verbalized understanding;Returned demonstration;Need further instruction          PT Short Term Goals - 07/24/16 1747      PT SHORT TERM GOAL #1   Title Patient to be independent with HEP (08/07/16)   Time 4   Period Weeks   Status On-going           PT Long Term Goals - 07/24/16 1747      PT LONG TERM GOAL #1   Title Patient to be independent with advanced HEP (10/02/16)   Time 12   Period Months   Status On-going     PT LONG TERM GOAL #2   Title Patient to improve B hip strength to >/= 4/5 (10/02/16)   Time 12   Period Weeks   Status On-going     PT LONG  TERM GOAL #3   Title Patient to improve walking tolerance to >30 minutes without increased pain to facilitate return to exericse program (10/02/16)   Time 12   Period Weeks   Status On-going     PT LONG TERM GOAL #4   Title Patient to report back pain </= 2/10 for greater than 2 weeks (10/03/15)   Time 12   Period Weeks   Status On-going     PT LONG TERM GOAL #5   Title Patient to demonstrate proper postural awareness with appropriate core activiation to reduce stress on low back for pain reduction (10/03/15)   Time 12   Period Weeks   Status On-going               Plan - 07/24/16 1747    Clinical Impression Statement Pt reporting limited completion of HEP secondary to "putting her back out" while lifting her 22# nephew. Given this treatment session intiated education in proper posture and body mechanics for normal daily tasks including lifting, followed by review of HEP. Pt requiring extensive cueing and clarification of HEP, therefore limited progression but did add additional options for piriformis  stretch and SKTC stretch as pt feeling like stretches help target her pain.    Rehab Potential Good   PT Frequency Biweekly   PT Duration 12 weeks   PT Treatment/Interventions ADLs/Self Care Home Management;Cryotherapy;Electrical Stimulation;Moist Heat;Ultrasound;Therapeutic exercise;Therapeutic activities;Patient/family education;Manual techniques;Taping;Dry needling;Iontophoresis 4mg /ml Dexamethasone   PT Next Visit Plan progress hip and core strength   Consulted and Agree with Plan of Care Patient      Patient will benefit from skilled therapeutic intervention in order to improve the following deficits and impairments:  Decreased activity tolerance, Decreased range of motion, Decreased mobility, Decreased strength, Difficulty walking, Increased muscle spasms, Pain  Visit Diagnosis: Chronic low back pain without sciatica, unspecified back pain laterality  Muscle weakness (generalized)     Problem List Patient Active Problem List   Diagnosis Date Noted  . Thyroid activity decreased 06/10/2014  . PCOS (polycystic ovarian syndrome) 06/16/2013  . Migraines 06/16/2013  . Unspecified hypothyroidism 06/10/2013    Percival Spanish, PT, MPT 07/24/2016, 5:55 PM  [facility] [address]  [place], [place], [address] Phone: [phone]   Fax:  [phone]  Name: Taylor Bennett MRN: [id number] Date of Birth: August 11, 1981

## 2016-07-27 ENCOUNTER — Ambulatory Visit (INDEPENDENT_AMBULATORY_CARE_PROVIDER_SITE_OTHER): Payer: 59 | Admitting: Specialist

## 2016-07-31 ENCOUNTER — Ambulatory Visit: Payer: 59 | Admitting: Physical Therapy

## 2016-08-03 ENCOUNTER — Other Ambulatory Visit: Payer: 59

## 2016-08-06 ENCOUNTER — Ambulatory Visit: Payer: 59 | Admitting: Physical Therapy

## 2016-08-07 ENCOUNTER — Ambulatory Visit: Payer: 59 | Admitting: Physical Therapy

## 2016-08-08 ENCOUNTER — Ambulatory Visit: Payer: 59 | Admitting: Endocrinology

## 2016-08-09 ENCOUNTER — Ambulatory Visit (INDEPENDENT_AMBULATORY_CARE_PROVIDER_SITE_OTHER): Payer: 59 | Admitting: Specialist

## 2016-08-09 ENCOUNTER — Encounter (INDEPENDENT_AMBULATORY_CARE_PROVIDER_SITE_OTHER): Payer: Self-pay

## 2016-08-09 ENCOUNTER — Encounter: Payer: Self-pay | Admitting: Internal Medicine

## 2016-08-09 ENCOUNTER — Ambulatory Visit (INDEPENDENT_AMBULATORY_CARE_PROVIDER_SITE_OTHER): Payer: 59 | Admitting: Internal Medicine

## 2016-08-09 VITALS — BP 110/70 | HR 74 | Ht 63.0 in | Wt 190.0 lb

## 2016-08-09 DIAGNOSIS — E559 Vitamin D deficiency, unspecified: Secondary | ICD-10-CM

## 2016-08-09 DIAGNOSIS — E890 Postprocedural hypothyroidism: Secondary | ICD-10-CM

## 2016-08-09 NOTE — Patient Instructions (Addendum)
Please continue Armour Thyroid 60 mg alternating with 90 mg every other day.  Take the thyroid hormone every day, with water, at least 30 minutes before breakfast, separated by at least 4 hours from: - acid reflux medications - calcium - iron - multivitamins  Move coffee + milk at least 30 min after Armour.  Please stop at the lab.  Please come back for a follow-up appointment in 6 months.

## 2016-08-09 NOTE — Progress Notes (Signed)
I Patient ID: Taylor Bennett, female   DOB: Feb 26, 1981, 35 y.o.   MRN: [id number]    HPI  Taylor Bennett is a 35 y.o.-year-old female, referred by her PCP, REDMON,NOELLE, PA-C, for management of postsurgical hypothyroidism. She previously saw Dr. Dwyane Dee (last OV 1 year ago), but would like to switch to see me.  Pt. has a h/o Hashimoto's thyroiditis and now has postsurgical hypothyroidismafter her total thyroidectomy for a suspicious nodule on 12/19/2015 (final pathology >> benign). Surgery was performed by Dr. Celine Ahr. She had compression sxs before the surgery >> now resolved.  She was initially on Synthroid >> fatigue>> now is on Armour Thyroid 60 mg alternating with 90 mg every other day (equivalent average: 125 mcg LT4 per day).  She takes the thyroid hormone: - fasting - with water - separated by ~30 min from b'fast  - no calcium, iron, PPIs - + occasional multivitamins - with b'fast (!)  I reviewed pt's thyroid tests: 01/2016: TSH 2.6 Lab Results  Component Value Date   TSH 0.94 07/28/2015   TSH 0.962 07/19/2015   TSH 0.66 06/07/2014   TSH 1.11 12/23/2013   TSH 1.813 06/12/2013   FREET4 0.60 07/28/2015   FREET4 0.77 06/07/2014   FREET4 0.68 12/23/2013   FREET4 0.94 06/12/2013    Pt describes: - no weight gain - + fatigue - + hot flushes - no anxiety/depression - no constipation - no dry skin - + hair loss  She has occasional hoarseness after the surgery.   She has + FH of thyroid disorders in: mother. No FH of thyroid cancer.  No h/o radiation tx to head or neck. No recent use of iodine supplements.  Pt. also has a history of PCOS - was on Metformin for 6-8 mo >> then stopped. On Nuva Ring (Dr Landry Mellow). She also had a severe vertigo episode this summer >> on Meclizine.  She also has DDD in cervical spine. On PT. H/o vitamin D def.  ROS: Constitutional: + see HPI + nocturia Eyes: no blurry vision, no xerophthalmia ENT: no sore throat, no nodules palpated in throat, no  dysphagia/odynophagia, + hoarseness Cardiovascular: no CP/SOB/palpitations/+ feet swelling Respiratory: no cough/SOB Gastrointestinal: no N/V/D/C Musculoskeletal: + muscle/+ joint aches Skin: no rashes, + hair loss Neurological: no tremors/numbness/tingling/dizziness, + HA Psychiatric: no depression/anxiety  Past Medical History:  Diagnosis Date  . PCOS (polycystic ovarian syndrome)   . Thyroid disease    No past surgical history on file. Social History   Social History  . Marital status: Single    Spouse name: N/A  . Number of children: N/A  . Years of education: N/A   Occupational History  . Not on file.   Social History Main Topics  . Smoking status: Never Smoker  . Smokeless tobacco: Never Used  . Alcohol use No  . Drug use: No   Current Outpatient Prescriptions on File Prior to Visit  Medication Sig Dispense Refill  . ARMOUR THYROID 60 MG tablet TAKE ONE TABLET BY MOUTH EVERY OTHER DAY AND ONE & ONE-HALF EVERY OTHER DAY 38 tablet 5  . etonogestrel-ethinyl estradiol (NUVARING) 0.12-0.015 MG/24HR vaginal ring Place 1 each vaginally every 28 (twenty-eight) days. Insert vaginally and leave in place for 3 consecutive weeks, then remove for 1 week.    Marland Kitchen ibuprofen (ADVIL,MOTRIN) 200 MG tablet Take 200 mg by mouth every 6 (six) hours as needed.    . meclizine (ANTIVERT) 25 MG tablet Take 1 tablet (25 mg total) by mouth 3 (three) times  daily as needed for dizziness. 20 tablet 0  . metaxalone (SKELAXIN) 800 MG tablet Take 800 mg by mouth daily as needed for muscle spasms.    . metFORMIN (GLUCOPHAGE-XR) 500 MG 24 hr tablet Take one tablet 2 times daily 60 tablet 5  . naproxen sodium (ANAPROX) 220 MG tablet Take 220 mg by mouth 2 (two) times daily with a meal.    . OVER THE COUNTER MEDICATION Total control Herbalife    . rizatriptan (MAXALT) 10 MG tablet Take 10 mg by mouth as needed for migraine. May repeat in 2 hours if needed     No current facility-administered medications on  file prior to visit.    No Known Allergies Family History  Problem Relation Age of Onset  . Thyroid disease Mother   . Diabetes Father    PE: BP 110/70   Pulse 74   Ht 5\' 3"  (1.6 m)   Wt 190 lb (86.2 kg)   BMI 33.66 kg/m  Wt Readings from Last 3 Encounters:  08/09/16 190 lb (86.2 kg)  08/08/15 186 lb 9.6 oz (84.6 kg)  07/19/15 184 lb (83.5 kg)   Constitutional: overweight, in NAD Eyes: PERRLA, EOMI, no exophthalmos ENT: moist mucous membranes, Cervical scar healing, with minimal keloid and erythema, no cervical lymphadenopathy Cardiovascular: RRR, No MRG Respiratory: CTA B Gastrointestinal: abdomen soft, NT, ND, BS+ Musculoskeletal: no deformities, strength intact in all 4 Skin: moist, warm, no rashes Neurological: no tremor with outstretched hands, DTR normal in all 4  ASSESSMENT: 1. Postsurgical Hypothyroidism  2. Vitamin D deficiency  PLAN:  1. Patient with long-standing hypothyroidism, now complete, after her total thyroidectomy. She is on dessicated thyroid extract, Armour Thyroid 60 mg alternating with 90 mg every other day. She feels good on this formulation, previously feeling fatigued on levothyroxine. - she appears euthyroid.  - We discussed about correct intake of Armour, fasting, with water, separated by at least 30 minutes from breakfast, and separated by more than 4 hours from calcium, iron, multivitamins, acid reflux medications (PPIs). She is drinking her coffee with half and half less than 30 minutes after taking Armour, so I advised her to move her coffee at least 30 minutes away or eliminate the dairy. - will check thyroid tests today: TSH, free T4, free T4 - If labs today are abnormal, she will need to return in ~6 weeks for repeat labs - Otherwise, I will see her back in 6 months  2. Vitamin D deficiency - We'll recheck her vitamin D today  Needs 60 and 90 mg tab Armour  Component     Latest Ref Rng & Units 08/09/2016  TSH     0.35 - 4.50 uIU/mL  3.42  T4,Free(Direct)     0.60 - 1.60 ng/dL 0.52 (L)  Triiodothyronine,Free,Serum     2.3 - 4.2 pg/mL 3.0  VITD     30.00 - 100.00 ng/mL 12.07 (L)   Thyroid tests are normal - will refill her Armour at the current dose. Vitamin D is very low. We'll start 5000 units vitamin D daily and recheck her level in 3 months.  Philemon Kingdom, MD PhD [facility] Endocrinology

## 2016-08-10 DIAGNOSIS — E559 Vitamin D deficiency, unspecified: Secondary | ICD-10-CM | POA: Insufficient documentation

## 2016-08-10 LAB — T4, FREE: Free T4: 0.52 ng/dL — ABNORMAL LOW (ref 0.60–1.60)

## 2016-08-10 LAB — T3, FREE: T3 FREE: 3 pg/mL (ref 2.3–4.2)

## 2016-08-10 LAB — VITAMIN D 25 HYDROXY (VIT D DEFICIENCY, FRACTURES): VITD: 12.07 ng/mL — ABNORMAL LOW (ref 30.00–100.00)

## 2016-08-10 LAB — TSH: TSH: 3.42 u[IU]/mL (ref 0.35–4.50)

## 2016-08-10 MED ORDER — ARMOUR THYROID 60 MG PO TABS: ORAL_TABLET | ORAL | 3 refills | Status: DC

## 2016-08-10 MED ORDER — ARMOUR THYROID 90 MG PO TABS: ORAL_TABLET | ORAL | 3 refills | Status: DC

## 2016-08-13 MED FILL — ARMOUR THYROID 90 MG TABLET: 90 | 30 days supply | Qty: 15 | Fill #0

## 2016-08-13 MED FILL — ARMOUR THYROID 60 MG TABLET: 60 | 30 days supply | Qty: 15 | Fill #0

## 2016-08-14 ENCOUNTER — Ambulatory Visit: Payer: 59 | Admitting: Physical Therapy

## 2016-08-15 ENCOUNTER — Encounter: Payer: Self-pay | Admitting: Internal Medicine

## 2016-08-15 ENCOUNTER — Telehealth: Payer: 59 | Admitting: Family

## 2016-08-15 DIAGNOSIS — J301 Allergic rhinitis due to pollen: Secondary | ICD-10-CM | POA: Diagnosis not present

## 2016-08-15 MED ORDER — FLUTICASONE PROPIONATE 50 MCG/ACT NA SUSP: 2.0000 | Freq: Every day | NASAL | 6 refills | Status: DC

## 2016-08-15 MED FILL — FLUTICASONE PROP 50 MCG SPR: 50 | 30 days supply | Qty: 16 | Fill #0

## 2016-08-15 NOTE — Progress Notes (Signed)

## 2016-08-20 ENCOUNTER — Other Ambulatory Visit: Payer: Self-pay

## 2016-08-20 ENCOUNTER — Ambulatory Visit: Payer: 59

## 2016-08-20 MED ORDER — AZITHROMYCIN 250 MG PO TABS: ORAL_TABLET | ORAL | 0 refills | Status: DC

## 2016-08-20 MED FILL — AZITHROMYCIN 250 MG TABLET: 250 | 5 days supply | Qty: 6 | Fill #0

## 2016-08-20 NOTE — Telephone Encounter (Signed)
Per Dr. Trinidad Curet,  Call in [place] 250 mg x 5 days to [facility] in [place], [place]

## 2016-08-21 ENCOUNTER — Ambulatory Visit: Payer: 59 | Admitting: Physical Therapy

## 2016-08-22 ENCOUNTER — Encounter: Payer: 59 | Admitting: Physical Therapy

## 2016-08-31 ENCOUNTER — Telehealth: Payer: Self-pay | Admitting: Allergy & Immunology

## 2016-08-31 MED ORDER — AMOXICILLIN-POT CLAVULANATE 875-125 MG PO TABS: 1.0000 | ORAL_TABLET | Freq: Two times a day (BID) | ORAL | 0 refills | Status: AC

## 2016-08-31 NOTE — Telephone Encounter (Signed)
I received a call from Ms. Berkland. She is complaining of ear fullness following three weeks of a sinus infection despite treatment with a Z-pack. I did look in her ears and she had an AOM on the right side. Therefore I sent in a prescription for Augmentin 875mg  BID for ten days.   Salvatore Marvel, MD [place] [place]

## 2016-09-13 ENCOUNTER — Ambulatory Visit: Payer: 59 | Admitting: Physical Therapy

## 2016-09-14 ENCOUNTER — Telehealth (INDEPENDENT_AMBULATORY_CARE_PROVIDER_SITE_OTHER): Payer: Self-pay | Admitting: Specialist

## 2016-09-14 NOTE — Telephone Encounter (Signed)
Please advise 

## 2016-09-17 ENCOUNTER — Other Ambulatory Visit (INDEPENDENT_AMBULATORY_CARE_PROVIDER_SITE_OTHER): Payer: Self-pay | Admitting: Specialist

## 2016-09-17 DIAGNOSIS — M5442 Lumbago with sciatica, left side: Principal | ICD-10-CM

## 2016-09-17 DIAGNOSIS — G8929 Other chronic pain: Secondary | ICD-10-CM

## 2016-09-17 NOTE — Telephone Encounter (Signed)
Order for PT at [facility] placed. jen

## 2016-09-21 ENCOUNTER — Ambulatory Visit (INDEPENDENT_AMBULATORY_CARE_PROVIDER_SITE_OTHER): Payer: 59 | Admitting: Specialist

## 2016-09-21 ENCOUNTER — Encounter (INDEPENDENT_AMBULATORY_CARE_PROVIDER_SITE_OTHER): Payer: Self-pay | Admitting: Specialist

## 2016-09-21 VITALS — BP 117/83 | HR 78 | Ht 62.5 in | Wt 191.0 lb

## 2016-09-21 DIAGNOSIS — M47812 Spondylosis without myelopathy or radiculopathy, cervical region: Secondary | ICD-10-CM

## 2016-09-21 DIAGNOSIS — R202 Paresthesia of skin: Secondary | ICD-10-CM

## 2016-09-21 DIAGNOSIS — R200 Anesthesia of skin: Secondary | ICD-10-CM

## 2016-09-21 DIAGNOSIS — M5136 Other intervertebral disc degeneration, lumbar region: Secondary | ICD-10-CM

## 2016-09-21 DIAGNOSIS — M4185 Other forms of scoliosis, thoracolumbar region: Secondary | ICD-10-CM | POA: Diagnosis not present

## 2016-09-21 NOTE — Patient Instructions (Addendum)
Avoid overhead lifting and overhead use of the arms. Do not lift greater than 5 lbs. Adjust head rest in vehicle to prevent hyperextension if rear ended. Use the the left wrist splint at night and also during the day is necessary.  Avoid frequent bending and stooping  No lifting greater than 25 lbs. May use ice or moist heat for pain. Weight loss is of benefit.

## 2016-09-21 NOTE — Progress Notes (Addendum)
Office Visit Note   Patient: Taylor Bennett           Date of Birth: 1981-03-05           MRN: [id number] Visit Date: 09/21/2016              Requested by: Lennie Odor, PA-C [address] [place], [place] [address] PCP: REDMON,NOELLE, PA-C   Assessment & Plan: Visit Diagnoses:  1. Numbness and tingling of left hand   2. Spondylosis without myelopathy or radiculopathy, cervical region   3. Other form of scoliosis of thoracolumbar spine   4. Degenerative disc disease, lumbar     Plan: void overhead lifting and overhead use of the arms. Do not lift greater than 5 lbs. Adjust head rest in vehicle to prevent hyperextension if rear ended. Use the the left wrist splint at night and also during the day is necessary. We will arrange for neurology evaluation of Upper extremities to assess for radiculopathy vs CTS. Avoid frequent bending and stooping  No lifting greater than 25 lbs. May use ice or moist heat for pain. Weight loss is of benefit.  Follow-Up Instructions: Return in about 4 weeks (around 10/19/2016).   Orders:  Orders Placed This Encounter  Procedures  . Ambulatory referral to Physical Medicine Rehab   No orders of the defined types were placed in this encounter.     Procedures: No procedures performed   Clinical Data: Findings:  Radiographs from the chiropractors office reviewed. AP and Lat  Cervical spine with autofusion C6-7, mild straightening of the normal cervical lordosis. Degenerative disc narrowing  C5-6 about 30% with endplate sclerosis, remaining upper cervical segments including C1-2 are normal. AP radiograph of the c-spine shows a left side curve of the c-spine at the cervicothoracic area, which is mild this is a continuation of a cervicothoracolumbar scoliosis. AP and Lat radiographs of the thoracic spine show a right sided thoracic curve apex at the D6-7 level, with mild rotation, no fracture, dislocation or subluxation. AP and Lat  radiographs of the lumbar spine show a left lumbar curve apex to the left at L3 with mild rotation, Disc space narrowing at L2-3, L3-4 and L4-5 which is mild. Mild spondylosis Changes on the AP view. Left SI joint with mild sclerosis compared to the right SI and formation of minimal spurs off the inferior left SI joint. Hips appear normal.    Subjective: Chief Complaint  Patient presents with  . Lower Back - Follow-up    [name] is here for follow up. She states that her neck and back are doing about the same, she has her good and bad days.Numbness in both hands and some neck pain. Lumbar pain is intermittant and related to bending and stooping and lifting. No bowel or bladder difficulties.  Skelaxin seems to help with occasional spasm she experiences in the back with bending stooping or lifting. Twisting can also set off the spasm. She takes the skelaxin only on an occasion, less than once a week. Scoliosis recognized as a child and she choose not to have surgical intervention. Positive night numbness and pain left shoulder into the left hand, improves with shaking arms and with changing position of the arms. Left hand numbness is worse than the right. Has had FLMA placed, works 2 jobs. Has been to PT for 2 sessions before a URTI caused her to discontinue, a new prescription for therapy has been provided to allow to  restart therapy. She finds  that it does help.    Review of Systems  Constitutional: Negative.   HENT: Negative.   Eyes: Negative.   Respiratory: Negative.   Cardiovascular: Negative.   Gastrointestinal: Negative.   Endocrine: Negative.   Genitourinary: Negative.   Musculoskeletal: Negative.   Skin: Negative.   Allergic/Immunologic: Negative.   Neurological: Negative.   Hematological: Negative.   Psychiatric/Behavioral: Negative.      Objective: Vital Signs: BP 117/83 (BP Location: Left Arm, Patient Position: Sitting)   Pulse 78   Ht 5' 2.5" (1.588 m)   Wt 191 lb  (86.6 kg)   BMI 34.38 kg/m   Physical Exam  Constitutional: She is oriented to person, place, and time. She appears well-developed and well-nourished.  HENT:  Head: Normocephalic and atraumatic.  Eyes: EOM are normal. Pupils are equal, round, and reactive to light.  Neck: Normal range of motion. Neck supple.  Pulmonary/Chest: Effort normal and breath sounds normal.  Abdominal: Soft. Bowel sounds are normal.  Neurological: She is alert and oriented to person, place, and time.  Skin: Skin is warm and dry.  Psychiatric: She has a normal mood and affect. Her behavior is normal. Judgment and thought content normal.    Back Exam   Tenderness  The patient is experiencing tenderness in the lumbar and cervical.  Range of Motion  Extension: abnormal  Flexion: abnormal  Lateral Bend Right: abnormal  Lateral Bend Left: abnormal  Rotation Right: abnormal  Rotation Left: abnormal   Muscle Strength  Right Quadriceps:  5/5  Left Quadriceps:  5/5  Right Hamstrings:  5/5  Left Hamstrings:  5/5   Tests  Straight leg raise right: negative Straight leg raise left: negative  Reflexes  Patellar: normal Achilles: normal Biceps: normal Babinski's sign: normal   Other  Toe Walk: normal Heel Walk: normal Sensation: normal Gait: normal  Erythema: no back redness Scars: absent   Right Hand Exam  Right hand exam is normal.  Tenderness  The patient is experiencing tenderness in the palmer area.  Range of Motion   Wrist  Extension: normal  Flexion: normal  Pronation: normal  Supination: normal   Muscle Strength  Wrist Extension: 5/5  Wrist Flexion: 5/5  Grip: 5/5   Tests  Phalen's Sign: negative Tinel's Sign (Medial Nerve): negative Finkelstein: negative  Other  Erythema: absent Scars: absent Sensation: normal Pulse: present   Left Hand Exam   Tenderness  The patient is experiencing tenderness in the palmer area.   Range of Motion   Wrist  Extension:  normal  Flexion: normal  Pronation: normal  Supination: normal   Muscle Strength  Wrist Extension: 5/5  Wrist Flexion: 5/5  Grip:  5/5   Tests  Phalen's Sign: positive Tinel's Sign (Medial Nerve): negative Finkelstein: negative  Other  Erythema: absent Scars: absent Sensation: normal Pulse: present      Specialty Comments:  No specialty comments available.  Imaging: No results found.   PMFS History: Patient Active Problem List   Diagnosis Date Noted  . Vitamin D deficiency 08/10/2016  . PCOS (polycystic ovarian syndrome) 06/16/2013  . Migraines 06/16/2013  . Unspecified hypothyroidism 06/10/2013   Past Medical History:  Diagnosis Date  . PCOS (polycystic ovarian syndrome)   . Thyroid disease     Family History  Problem Relation Age of Onset  . Thyroid disease Mother   . Diabetes Father     No past surgical history on file. Social History   Occupational History  . Not on file.  Social History Main Topics  . Smoking status: Never Smoker  . Smokeless tobacco: Never Used  . Alcohol use No  . Drug use: No  . Sexual activity: Not on file

## 2016-09-25 ENCOUNTER — Other Ambulatory Visit: Payer: 59

## 2016-10-01 ENCOUNTER — Ambulatory Visit: Payer: 59 | Attending: Specialist | Admitting: Physical Therapy

## 2016-10-01 DIAGNOSIS — M545 Low back pain, unspecified: Secondary | ICD-10-CM

## 2016-10-01 DIAGNOSIS — M6281 Muscle weakness (generalized): Secondary | ICD-10-CM | POA: Insufficient documentation

## 2016-10-01 DIAGNOSIS — G8929 Other chronic pain: Secondary | ICD-10-CM | POA: Insufficient documentation

## 2016-10-01 MED FILL — NUVARING VAGINAL RING: 0.12-0.015 | 84 days supply | Qty: 3 | Fill #1

## 2016-10-01 MED FILL — ARMOUR THYROID 90 MG TABLET: 90 | 30 days supply | Qty: 15 | Fill #1

## 2016-10-01 MED FILL — ARMOUR THYROID 60 MG TABLET: 60 | 30 days supply | Qty: 15 | Fill #1

## 2016-10-01 NOTE — Therapy (Addendum)
[facility] [address]  [place], [place], [address] Phone: [phone]   Fax:  [phone]  Physical Therapy Treatment  Patient Details  Name: Taylor Bennett MRN: [id number] Date of Birth: 11/28/1980 Referring Provider: Dr. Louanne Skye  Encounter Date: 10/01/2016      PT End of Session - 10/01/16 1241    Visit Number 3   Number of Visits 12   Date for PT Re-Evaluation 11/12/16   PT Start Time 1103   PT Stop Time 1151   PT Time Calculation (min) 48 min   Activity Tolerance Patient tolerated treatment well   Behavior During Therapy [facility] for tasks assessed/performed      Past Medical History:  Diagnosis Date  . PCOS (polycystic ovarian syndrome)   . Thyroid disease     No past surgical history on file.  There were no vitals filed for this visit.      Subjective Assessment - 10/01/16 1236    Subjective Saw Dr. Louanne Skye last Friday - get new referral for PT. Louanne Skye is sending patient to neurologist due to pain in cervical and lumbar regions. Has been doing HEP initially given to her - with some relief. Wants to review exercises for home practice. Has been practicing good ergonomics. Sleeping continues to be an issue. Got Taylor treadmill for home - able to walk for up to 15 minutes Taylor time - but Taylor little sore after. Pain - good and bad days - 1-2 spasms (low back) Taylor week - goes away Taylor lot quicker, has began taking skelaxin.    Diagnostic tests x-ray with patient reporting disc degeneration   Patient Stated Goals "minimize back spasm and return to exercising"   Currently in Pain? Yes   Pain Score 1    Pain Location Back   Pain Orientation Lower   Pain Descriptors / Indicators Aching;Dull   Pain Type Chronic pain   Pain Radiating Towards intermittent into B LE   Aggravating Factors  bending, lifting, twisting, pushing   Pain Relieving Factors rest, meds   Effect of Pain on Daily Activities light duty at work             [facility] PT Assessment - 10/01/16 1143      Assessment   Medical Diagnosis scoliosis, lumbar back pain, thoracic back pain   Referring Provider Dr. Louanne Skye     Precautions   Precaution Comments light duty at work     Balance Screen   Has the patient fallen in the past 6 months No   Has the patient had Taylor decrease in activity level because of Taylor fear of falling?  No   Is the patient reluctant to leave their home because of Taylor fear of falling?  No     Home Environment   Living Environment Private residence   Living Arrangements Children   Type of [place] to enter   Entrance Stairs-Number of Steps 5   Entrance Stairs-Rails Can reach both   [place] One level     Cognition   Overall Cognitive Status Within Functional Limits for tasks assessed     Strength   Strength Assessment Site Hip;Knee   Right/Left Hip Right;Left   Right Hip Flexion 4+/5   Right Hip Extension 3+/5   Right Hip ABduction 4-/5   Right Hip ADduction 3+/5   Left Hip Flexion 4+/5   Left Hip Extension 3+/5   Left Hip ABduction  4/5   Left Hip ADduction 3+/5   Right/Left Knee Right;Left   Right Knee Flexion 5/5   Right Knee Extension 5/5   Left Knee Flexion 5/5   Left Knee Extension 5/5     Flexibility   Soft Tissue Assessment /Muscle Length yes   Hamstrings R tightness   Piriformis B tightness                     [facility] Adult PT Treatment/Exercise - 10/01/16 0001      Lumbar Exercises: Stretches   Single Knee to Chest Stretch Limitations HEP review   Piriformis Stretch Limitations HEP review     Lumbar Exercises: Standing   Row Strengthening;Both;15 reps;Theraband   Theraband Level (Row) Level 3 (Green)   Other Standing Lumbar Exercises paloff press x 10 each side - green tband     Lumbar Exercises: Seated   Hip Flexion on Ball Limitations ab set with alternating LE marches x 10; abset with alternating LE kick-outs x 10; ab set with row x 10     Lumbar Exercises:  Supine   Ab Set 15 reps;5 seconds   AB Set Limitations heavy VC for proper movement; education on progressiong to arms overhead x 15 reps and alternating LE marches x 15 reps   Bridge 10 reps;5 seconds     Lumbar Exercises: Sidelying   Hip Abduction 10 reps                PT Education - 10/01/16 1241    Education provided Yes   Education Details comprehensive HEP   Person(s) Educated Patient   Methods Explanation;Demonstration;Handout   Comprehension Verbalized understanding;Returned demonstration;Need further instruction          PT Short Term Goals - 10/01/16 1242      PT SHORT TERM GOAL #1   Title Patient to be independent with HEP (08/07/16)   Status Achieved           PT Long Term Goals - 10/01/16 1242      PT LONG TERM GOAL #1   Title Patient to be independent with advanced HEP (11/12/16)   Status Revised     PT LONG TERM GOAL #2   Title Patient to improve B hip strength to >/= 4/5 (11/12/16)   Status Revised     PT LONG TERM GOAL #3   Title Patient to improve walking tolerance to >30 minutes without increased pain to facilitate return to exericse program (11/12/16)   Status Revised     PT LONG TERM GOAL #4   Title Patient to report back pain </= 2/10 for greater than 2 weeks (11/13/15)   Status Revised     PT LONG TERM GOAL #5   Title Patient to demonstrate proper postural awareness with appropriate core activiation to reduce stress on low back for pain reduction (11/13/15)   Status Revised               Plan - 10/01/16 1243    Clinical Impression Statement Patient returning to PT today following greater than 30 day lapse since last date seen. Patient with new referral from MD, however feels like this may be more of Taylor chronic issue and wants to establish comprehensive HEP for independent home practice for prevention and management of low back pain. Patient reporting good complinace with initial HEP however, is curious about ways to continue to  strengthen hip and core musculature. Patient with some continued hip weakness, however reporting  reduction in overall symptom provocation since initially beginning therapy. Review today of initial HEP with addition of progressions for hip and core stengthening with good carryover. Discussion with patient regaridng need for good compliance going forward as this will primarily be up to patient. Discussion also on PT preferring to see patinet back in clinic for at least one more session to ensure good carryover before discharging patient from therapy services. Patient with good understanding of all education, HEP, and need for good compliance to maintian functional gains and to continue to prevent/manage low back pain.    Rehab Potential Good   PT Frequency 1x / week   PT Duration 6 weeks   PT Treatment/Interventions ADLs/Self Care Home Management;Cryotherapy;Electrical Stimulation;Moist Heat;Ultrasound;Therapeutic exercise;Therapeutic activities;Patient/family education;Manual techniques;Taping;Dry needling;Iontophoresis 52m/ml Dexamethasone   PT Next Visit Plan progress hip and core strength; possible d/c   Consulted and Agree with Plan of Care Patient      Patient will benefit from skilled therapeutic intervention in order to improve the following deficits and impairments:  Decreased activity tolerance, Decreased range of motion, Decreased mobility, Decreased strength, Difficulty walking, Increased muscle spasms, Pain  Visit Diagnosis: Chronic low back pain without sciatica, unspecified back pain laterality - Plan: PT plan of care cert/re-cert  Muscle weakness (generalized) - Plan: PT plan of care cert/re-cert     Problem List Patient Active Problem List   Diagnosis Date Noted  . Vitamin D deficiency 08/10/2016  . PCOS (polycystic ovarian syndrome) 06/16/2013  . Migraines 06/16/2013  . Unspecified hypothyroidism 06/10/2013     SLanney Gins PT, DPT 10/01/16 4:33 PM    PHYSICAL  THERAPY DISCHARGE SUMMARY  Visits from Start of Care: 3  Current functional level related to goals / functional outcomes: See above   Remaining deficits: See above   Education / Equipment: HEP  Plan: Patient agrees to discharge.  Patient goals were not met. Patient is being discharged due to not returning since the last visit.  ?????     Patient not returning since last visit. Chart review reveals completion of nerve conduction study with normal results. Will gladly see patient in the future for any other PT related needs.  SLanney Gins PT, DPT 12/17/16 8:43 AM   [facility] [place] [address]Phone: [phone]  Fax:  [phone] Name: ASweetie GieblerMRN: [id number] of Birth: [birth year]

## 2016-10-01 NOTE — Patient Instructions (Addendum)
Pelvic Tilt: Posterior - Legs Bent (Supine)   Tighten stomach and flatten back by rolling pelvis down. Hold __5__ seconds. Relax. Repeat __15__ times per set. Do __2__ sets per session.   1. Arms overhead 2. Alternating leg marches    Bilateral Isometric Hip Flexion   Tighten stomach and raise both knees to outstretched arms. Push gently, keeping arms straight, trunk rigid. Hold __5-10__ seconds. Repeat _15___ times per set. Do __2__ sets per session.    Bridge   Lie back, legs bent. Inhale, pressing hips up. Keeping ribs in, lengthen lower back. Exhale, rolling down along spine from top. Repeat __15__ times. Do __2_ sessions per day.    Side Leg Raise (Side-Lying)   Lie on side with support leg bent to 90. Lift top leg, leading with heel. Keep lifted leg straight. Hold 1 count. Lower leg to starting position. Repeat __15__ times, each leg.    Pelvic Tilt (Sitting)   Sit on ball. Tighten pelvic floor and hold. Roll ball slightly forward by tilting pelvis backwards. Relax. Repeat _15__ times. Do _2__ times a day. Advanced: Continue to hold squeeze through repetitions.  1. Alternating leg marches 2. Alternating leg kicks   Shoulder Row: Sitting    Face anchor. Palms down, pull elbows back, squeezing shoulder blades together. Repeat _15_ times per set. Do _2_ sets per session. Anchor Height: Chest  Seated on ball; or standing.     Chest Press - from side   In shoulder width stance with tubing to your side in door, and hands in punch position, press arms straight ahead. (both sides) Repeat _15_ times per set. Do __ sets per session. Do __ sessions per week.

## 2016-10-23 ENCOUNTER — Other Ambulatory Visit (INDEPENDENT_AMBULATORY_CARE_PROVIDER_SITE_OTHER): Payer: 59

## 2016-10-23 ENCOUNTER — Ambulatory Visit (INDEPENDENT_AMBULATORY_CARE_PROVIDER_SITE_OTHER): Payer: 59 | Admitting: Neurology

## 2016-10-23 ENCOUNTER — Encounter: Payer: Self-pay | Admitting: Neurology

## 2016-10-23 DIAGNOSIS — M5412 Radiculopathy, cervical region: Secondary | ICD-10-CM | POA: Diagnosis not present

## 2016-10-23 DIAGNOSIS — E559 Vitamin D deficiency, unspecified: Secondary | ICD-10-CM

## 2016-10-23 DIAGNOSIS — M542 Cervicalgia: Secondary | ICD-10-CM | POA: Diagnosis not present

## 2016-10-23 LAB — VITAMIN D 25 HYDROXY (VIT D DEFICIENCY, FRACTURES): VITD: 24.47 ng/mL — AB (ref 30.00–100.00)

## 2016-10-23 NOTE — Progress Notes (Signed)
PATIENT: Taylor Bennett DOB: April 28, 1981  Chief Complaint  Patient presents with  . Cervical Spondylosis    Reports having scoliosis that causes her to have chronic back and neck pain.  She is here today for her worsening neck pain.  She has been experiencing numbness and tingling down her left arm and into her hand.  Marland Kitchen PCP    Noelle Redmon, PA-C  . Orthopedics    Jessy Oto, MD - referring MD     HISTORICAL  Taylor Bennett is a 36 year old right-handed female, seen in refer by orthopedic surgeon Dr. Jessy Oto for evaluation of neck pain, radiating pain to her left arm, initial evaluation was on October 23 2016, her primary care is [facility].  She had a past medical history of hypothyroidism, on supplement, polycystic ovarian disease.  She had a history of scoliosis, had a chronic neck, low back pain, she has to go through thyroidectomy in 2017, for benign tumor, she complains of worsening neck, low back pain since surgery, she experienced back muscle spasm, involving different paraspinal muscles, sometimes painful to walk, she also experienced worsening neck pain, occasionally radiating pain to her left arm, she denied persistent sensory loss, no weakness, no bowel and bladder incontinence.  She was treated with physical therapy, ibuprofen 800 mg as needed with Skelaxin, has been helpful.  REVIEW OF SYSTEMS: Full 14 system review of systems performed and notable only for joint pain, achy muscles, headaches, numbness, sleepiness, snoring, not enough sleep, decreased  ALLERGIES: No Known Allergies  HOME MEDICATIONS: Current Outpatient Prescriptions  Medication Sig Dispense Refill  . ARMOUR THYROID 60 MG tablet Take 1 tablet by mouth every other day 45 tablet 3  . ARMOUR THYROID 90 MG tablet Take 1 tablet by mouth every other day 45 tablet 3  . Ascorbic Acid (VITAMIN C PO) Take by mouth daily.    . Biotin w/ Vitamins C & E (HAIR/SKIN/NAILS PO) Take by mouth daily.    .  Cholecalciferol (VITAMIN D PO) Take 5,000 Units by mouth daily.    Marland Kitchen etonogestrel-ethinyl estradiol (NUVARING) 0.12-0.015 MG/24HR vaginal ring Place 1 each vaginally every 28 (twenty-eight) days. Insert vaginally and leave in place for 3 consecutive weeks, then remove for 1 week.    Marland Kitchen ibuprofen (ADVIL,MOTRIN) 200 MG tablet Take 200 mg by mouth every 6 (six) hours as needed. Takes 600mg  - 800mg  daily, as needed.    . meclizine (ANTIVERT) 25 MG tablet Take 1 tablet (25 mg total) by mouth 3 (three) times daily as needed for dizziness. 20 tablet 0  . metaxalone (SKELAXIN) 800 MG tablet Take 800 mg by mouth daily as needed for muscle spasms.    . rizatriptan (MAXALT) 10 MG tablet Take 10 mg by mouth as needed for migraine. May repeat in 2 hours if needed     No current facility-administered medications for this visit.     PAST MEDICAL HISTORY: Past Medical History:  Diagnosis Date  . Back pain   . Cervical pain   . PCOS (polycystic ovarian syndrome)   . Scoliosis   . Thyroid disease     PAST SURGICAL HISTORY: Past Surgical History:  Procedure Laterality Date  . THYROIDECTOMY    . WISDOM TOOTH EXTRACTION      FAMILY HISTORY: Family History  Problem Relation Age of Onset  . Thyroid disease Mother   . Diabetes Father   . Congestive Heart Failure Father   . Alcoholism Father  SOCIAL HISTORY:  Social History   Social History  . Marital status: Single    Spouse name: N/A  . Number of children: 2  . Years of education: 2 years college   Occupational History  . ED - Admissions    Social History Main Topics  . Smoking status: Former Research scientist (life sciences)  . Smokeless tobacco: Never Used  . Alcohol use No  . Drug use: No  . Sexual activity: Not on file   Other Topics Concern  . Not on file   Social History Narrative   Lives at home with her fiance and children.   Right-handed.   1-2 cup caffeine daily.     PHYSICAL EXAM   Vitals:   10/23/16 1315  BP: 118/77  Pulse: 70    Weight: 193 lb (87.5 kg)  Height: 5' 2.5" (1.588 m)    Not recorded      Body mass index is 34.74 kg/m.  PHYSICAL EXAMNIATION:  Gen: NAD, conversant, well nourised, obese, well groomed                     Cardiovascular: Regular rate rhythm, no peripheral edema, warm, nontender. Eyes: Conjunctivae clear without exudates or hemorrhage Neck: Supple, no carotid bruits. Pulmonary: Clear to auscultation bilaterally   NEUROLOGICAL EXAM:  MENTAL STATUS: Speech:    Speech is normal; fluent and spontaneous with normal comprehension.  Cognition:     Orientation to time, place and person     Normal recent and remote memory     Normal Attention span and concentration     Normal Language, naming, repeating,spontaneous speech     Fund of knowledge   CRANIAL NERVES: CN II: Visual fields are full to confrontation. Fundoscopic exam is normal with sharp discs and no vascular changes. Pupils are round equal and briskly reactive to light. CN III, IV, VI: extraocular movement are normal. No ptosis. CN V: Facial sensation is intact to pinprick in all 3 divisions bilaterally. Corneal responses are intact.  CN VII: Face is symmetric with normal eye closure and smile. CN VIII: Hearing is normal to rubbing fingers CN IX, X: Palate elevates symmetrically. Phonation is normal. CN XI: Head turning and shoulder shrug are intact CN XII: Tongue is midline with normal movements and no atrophy.  MOTOR: There is no pronator drift of out-stretched arms. Muscle bulk and tone are normal. Muscle strength is normal.  REFLEXES: Reflexes are 3 and symmetric at the biceps, triceps, knees, and ankles. Plantar responses are flexor.  SENSORY: Intact to light touch, pinprick, positional sensation and vibratory sensation are intact in fingers and toes.  COORDINATION: Rapid alternating movements and fine finger movements are intact. There is no dysmetria on finger-to-nose and heel-knee-shin.     GAIT/STANCE: Posture is normal. Gait is steady with normal steps, base, arm swing, and turning. Heel and toe walking are normal. Tandem gait is normal.  Romberg is absent.   DIAGNOSTIC DATA (LABS, IMAGING, TESTING) - I reviewed patient records, labs, notes, testing and imaging myself where available.   ASSESSMENT AND PLAN  Taylor Bennett is a 36 y.o. female   Chronic neck, low back pain,  She complains of radiating pain to left arm, hyperreflexia on examination,  Differentiation diagnosis including cervical spondylitic myelopathy versus musculoskeletal pain,  MRI of cervical spine  EMG nerve conduction study  Moderate exercise, NSAIDs as needed, heating pad   Marcial Pacas, M.D. Ph.D.  [facility] [address], [place], [place] [address]  Ph: [phone] Fax: [phone]  CC: Jessy Oto, MD, Lennie Odor, PA-C

## 2016-10-24 ENCOUNTER — Telehealth (INDEPENDENT_AMBULATORY_CARE_PROVIDER_SITE_OTHER): Payer: Self-pay

## 2016-10-24 ENCOUNTER — Telehealth: Payer: Self-pay | Admitting: Neurology

## 2016-10-24 MED FILL — RIZATRIPTAN 10 MG TABLET: 10 | 30 days supply | Qty: 9 | Fill #0

## 2016-10-24 MED FILL — METAXALONE 800 MG TABLET: 800 | 10 days supply | Qty: 30 | Fill #0

## 2016-10-24 NOTE — Telephone Encounter (Signed)
Patient aware that based that Dr. Krista Blue would like to start with the tests ordered.  She is agreeable.

## 2016-10-24 NOTE — Telephone Encounter (Signed)
Patient states she is also having pain in the LBP. She said she was just referred to Dr Rhea Belton office (nuerologist) for the neck. She states she needs to get an MRI of her lumar spine as well not just the neck.   Please call patient to advise  CB [phone]

## 2016-10-24 NOTE — Telephone Encounter (Signed)
Patient called office in reference to Cspine MRI, she is wanting to know if we are able to do MRI of the entire spine.  Please call

## 2016-10-24 NOTE — Telephone Encounter (Signed)
Left message for a return call

## 2016-10-25 ENCOUNTER — Telehealth (INDEPENDENT_AMBULATORY_CARE_PROVIDER_SITE_OTHER): Payer: Self-pay | Admitting: *Deleted

## 2016-10-25 NOTE — Telephone Encounter (Signed)
Patient called to advised she has canceled MRI C-Spine due to expenses of being over $2000.  Please call

## 2016-10-25 NOTE — Telephone Encounter (Signed)
Pt called stating she was referred to [facility] and she cannot afford the MRI, she cannot proceed with further testing. Pt canceled her next appt back with [facility] 3/16

## 2016-10-26 NOTE — Telephone Encounter (Signed)
Patient can't afford MRI Lspine at this time

## 2016-10-27 ENCOUNTER — Ambulatory Visit (HOSPITAL_BASED_OUTPATIENT_CLINIC_OR_DEPARTMENT_OTHER): Payer: 59

## 2016-10-29 DIAGNOSIS — M542 Cervicalgia: Secondary | ICD-10-CM | POA: Diagnosis not present

## 2016-10-29 DIAGNOSIS — M545 Low back pain: Secondary | ICD-10-CM | POA: Diagnosis not present

## 2016-10-29 DIAGNOSIS — M419 Scoliosis, unspecified: Secondary | ICD-10-CM | POA: Diagnosis not present

## 2016-10-29 DIAGNOSIS — R202 Paresthesia of skin: Secondary | ICD-10-CM | POA: Diagnosis not present

## 2016-10-29 DIAGNOSIS — M5030 Other cervical disc degeneration, unspecified cervical region: Secondary | ICD-10-CM | POA: Diagnosis not present

## 2016-10-29 DIAGNOSIS — M5136 Other intervertebral disc degeneration, lumbar region: Secondary | ICD-10-CM | POA: Diagnosis not present

## 2016-10-29 DIAGNOSIS — M47812 Spondylosis without myelopathy or radiculopathy, cervical region: Secondary | ICD-10-CM | POA: Diagnosis not present

## 2016-10-29 DIAGNOSIS — M5412 Radiculopathy, cervical region: Secondary | ICD-10-CM | POA: Diagnosis not present

## 2016-11-14 MED FILL — ARMOUR THYROID 90 MG TABLET: 90 | 30 days supply | Qty: 15 | Fill #2

## 2016-11-14 MED FILL — ARMOUR THYROID 60 MG TABLET: 60 | 30 days supply | Qty: 15 | Fill #2

## 2016-11-23 ENCOUNTER — Ambulatory Visit (INDEPENDENT_AMBULATORY_CARE_PROVIDER_SITE_OTHER): Payer: 59 | Admitting: Specialist

## 2016-11-26 ENCOUNTER — Telehealth: Payer: Self-pay | Admitting: Neurology

## 2016-11-26 NOTE — Telephone Encounter (Signed)
Patient called to advised she has canceled MRI C-Spine due to expenses of being over $2000.  She will keep her pending appt for her NCV/EMG this Friday.

## 2016-11-26 NOTE — Telephone Encounter (Signed)
Patient called office in reference to NCV/EMG scheduled for Friday.  Patient questioned if she was suppose to have MRI prior to the NCV/EMG.  Also patient has a few questions about the procedure.  Please call

## 2016-11-30 ENCOUNTER — Ambulatory Visit (INDEPENDENT_AMBULATORY_CARE_PROVIDER_SITE_OTHER): Payer: Self-pay | Admitting: Neurology

## 2016-11-30 ENCOUNTER — Ambulatory Visit (INDEPENDENT_AMBULATORY_CARE_PROVIDER_SITE_OTHER): Payer: 59 | Admitting: Neurology

## 2016-11-30 DIAGNOSIS — R202 Paresthesia of skin: Secondary | ICD-10-CM

## 2016-11-30 DIAGNOSIS — M5412 Radiculopathy, cervical region: Secondary | ICD-10-CM

## 2016-11-30 DIAGNOSIS — M542 Cervicalgia: Secondary | ICD-10-CM

## 2016-11-30 DIAGNOSIS — Z0289 Encounter for other administrative examinations: Secondary | ICD-10-CM

## 2016-11-30 NOTE — Procedures (Signed)
Full Name: Taylor Bennett Gender: Female MRN #: [id number] Date of Birth: 1981/02/24    Visit Date: 11/30/2016 09:35 Age: 36 Years 24 Months Old Examining Physician: Dr Marcial Pacas Referring Physician: Dr Louanne Skye History: 36 years old right-handed female complains of chronic low back pain, neck pain, radiating pain to left upper extremity  Summary of the test:  Nerve conduction study: Bilateral ulnar, median sensory and motor responses were normal.  Electromyography:  Selective needle examinations of left upper extremity and left cervical paraspinal muscles was normal.   Conclusion: This is a normal study, there is no electrodiagnostic evidence of left cervical radiculopathy or left upper extremity neuropathy.    ------------------------------- Marcial Pacas, M.D.  [facility] [place], [place] [address] Tel: [phone] Fax: [phone]        [facility]    Nerve / Sites Rec. Site Peak Lat Ref.  Amp Ref. Segments Distance    ms ms V V  cm  R Median - Orthodromic (Dig II, Mid palm)     Dig II Wrist 2.9 ?3.4 61 ?10 Dig II - Wrist 13  R Ulnar - Orthodromic, (Dig V, Mid palm)     Dig V Wrist 2.9 ?3.1 60 ?5 Dig V - Wrist 11  L Median - Orthodromic (Dig II, Mid palm)     Dig II Wrist 3.2 ?3.4 75 ?10 Dig II - Wrist 13  L Ulnar - Orthodromic, (Dig V, Mid palm)     Dig V Wrist 2.8 ?3.1 56 ?5 Dig V - Wrist 11             [facility]    Nerve / Sites Rec. Site Latency Ref. Amplitude Ref. Rel Amp Segments Distance Velocity Ref. Area    ms ms mV mV %  cm m/s m/s mVms  R Median - APB     Wrist APB 3.2 ?4.4 10.5 ?4.0 100 Wrist - APB 7   36.4     Upper arm APB 6.8  10.6  100 Upper arm - Wrist 23 63 ?49 37.2  R Ulnar - ADM     Wrist ADM 2.4 ?3.3 10.0 ?6.0 100 Wrist - ADM 7   34.5     B.Elbow ADM 5.7  8.2  82.2 B.Elbow - Wrist 19 58 ?49 28.6     A.Elbow ADM 7.3  8.4  103 A.Elbow - B.Elbow 10 62 ?49 28.1         A.Elbow - Wrist      L Median - APB     Wrist  APB 3.1 ?4.4 7.9 ?4.0 100 Wrist - APB 7   36.0     Upper arm APB 6.7  7.6  96.8 Upper arm - Wrist 22 62 ?49 34.9  L Ulnar - ADM     Wrist ADM 2.4 ?3.3 8.5 ?6.0 100 Wrist - ADM 7   23.7     B.Elbow ADM 5.6  8.7  103 B.Elbow - Wrist 20 63 ?49 27.8     A.Elbow ADM 7.1  8.5  97.7 A.Elbow - B.Elbow 10 66 ?49 28.0         A.Elbow - Wrist                 F  Wave    Nerve F Lat Ref.   ms ms  R Median - APB 24.3 ?31.0  R Ulnar - ADM 24.8 ?32.0  L Median - APB 24.3 ?31.0  L Ulnar - ADM 24.4 ?32.0             EMG       EMG Summary Table    Spontaneous MUAP Recruitment  Muscle IA Fib PSW Fasc Other Amp Dur. Poly Pattern  L. Pronator teres Normal None None None _______ Normal Normal Normal Normal  L. Deltoid Normal None None None _______ Normal Normal Normal Normal  L. Biceps brachii Normal None None None _______ Normal Normal Normal Normal  L. Triceps brachii Normal None None None _______ Normal Normal Normal Normal  L. First dorsal interosseous Normal None None None _______ Normal Normal Normal Normal  R. Cervical paraspinals Normal None None None _______ Normal Normal Normal Normal

## 2016-12-11 MED FILL — ARMOUR THYROID 90 MG TABLET: 90 | 30 days supply | Qty: 15 | Fill #3

## 2016-12-11 MED FILL — ARMOUR THYROID 60 MG TABLET: 60 | 30 days supply | Qty: 15 | Fill #3

## 2017-01-23 MED FILL — ARMOUR THYROID 60 MG TABLET: 60 | 30 days supply | Qty: 15 | Fill #4

## 2017-01-23 MED FILL — ARMOUR THYROID 90 MG TABLET: 90 | 30 days supply | Qty: 15 | Fill #4

## 2017-02-08 ENCOUNTER — Ambulatory Visit: Payer: 59 | Admitting: Internal Medicine

## 2017-02-14 ENCOUNTER — Ambulatory Visit: Payer: 59 | Admitting: Internal Medicine

## 2017-02-14 ENCOUNTER — Ambulatory Visit (INDEPENDENT_AMBULATORY_CARE_PROVIDER_SITE_OTHER): Payer: 59 | Admitting: Internal Medicine

## 2017-02-14 ENCOUNTER — Encounter: Payer: Self-pay | Admitting: Internal Medicine

## 2017-02-14 VITALS — BP 118/80 | HR 75 | Wt 192.0 lb

## 2017-02-14 DIAGNOSIS — E559 Vitamin D deficiency, unspecified: Secondary | ICD-10-CM | POA: Diagnosis not present

## 2017-02-14 DIAGNOSIS — E890 Postprocedural hypothyroidism: Secondary | ICD-10-CM

## 2017-02-14 NOTE — Patient Instructions (Signed)
Please continue Armour Thyroid 60 mg alternating with 90 mg every other day.  Take the thyroid hormone every day, with water, at least 30 minutes before breakfast, separated by at least 4 hours from: - acid reflux medications - calcium - iron - multivitamins  Please stop at the lab.  Please come back for a follow-up appointment in 6 months.

## 2017-02-14 NOTE — Progress Notes (Signed)
I Patient ID: Taylor Bennett, female   DOB: 1980-10-27, 36 y.o.   MRN: [id number]    HPI  Taylor Bennett is a 36 y.o.-year-old female, initially referred by her PCP, Redmon, Noelle, PA-C, for management of postsurgical hypothyroidism and vitamin D def.. She previously saw Dr. Dwyane Dee (last OV 1 year ago), but would like to switch to see me.  She has an URI >> improving.  Reviewed and addended hx: Pt. has a h/o Hashimoto's thyroiditis and now has postsurgical hypothyroidismafter her total thyroidectomy for a suspicious nodule on 12/19/2015 (final pathology >> benign). Surgery was performed by Dr. Celine Ahr. She had compression sxs before the surgery >> now resolved.  She was initially on Synthroid >>fatigue >> now is on Armour Thyroid 60 mg alternating with 90 mg every other day (equivalent average: 125 mcg LT4 per day).  He takes Armour: - in am - fasting - at least 30 min from b'fast - no Ca, Fe, PPIs - MVI at night - not on Biotin (stopped)  I reviewed pt's thyroid tests: Lab Results  Component Value Date   TSH 3.42 08/09/2016   TSH 0.94 07/28/2015   TSH 0.962 07/19/2015   TSH 0.66 06/07/2014   TSH 1.11 12/23/2013   TSH 1.813 06/12/2013   FREET4 0.52 (L) 08/09/2016   FREET4 0.60 07/28/2015   FREET4 0.77 06/07/2014   FREET4 0.68 12/23/2013   FREET4 0.94 06/12/2013   01/2016: TSH 2.6  Pt denies: - feeling nodules in neck - hoarseness - dysphagia - choking - SOB with lying down  She has + FH of thyroid disorders in: mother. No FH of thyroid cancer. No h/o radiation tx to head or neck.  No seaweed or kelp. No recent contrast studies. No herbal supplements. No Biotin use. No recent steroids use.   Pt. also has a history of PCOS - was on Metformin for 6-8 mo >> then stopped. She is on [employer] (Dr Landry Mellow), but now off x 2 mo. She also had a severe vertigo episode this summer >> on Meclizine.  She also has DDD in cervical spine. On PT.  She has a vitamin D deficiency:  Lab Results   Component Value Date   VD25OH 24.47 (L) 10/23/2016   VD25OH 12.07 (L) 08/09/2016   We started vitamin D 5000 IU daily, then increased to 10,000 IU in 10/2016.  ROS: Constitutional: no weight gain/no weight loss, no fatigue, no subjective hyperthermia, + subjective hypothermia Eyes: no blurry vision, no xerophthalmia ENT: no sore throat, no nodules palpated in throat, no dysphagia, no odynophagia, no hoarseness Cardiovascular: no CP/no SOB/+ palpitations/no leg swelling Respiratory: no cough/no SOB/+ wheezing Gastrointestinal: no N/no V/no D/no C/no acid reflux Musculoskeletal: + muscle aches/+ joint aches Skin: no rashes, + hair loss Neurological: no tremors/no numbness/no tingling/no dizziness  I reviewed pt's medications, allergies, PMH, social hx, family hx, and changes were documented in the history of present illness. Otherwise, unchanged from my initial visit note.   Past Medical History:  Diagnosis Date  . Back pain   . Cervical pain   . PCOS (polycystic ovarian syndrome)   . Scoliosis   . Thyroid disease    Past Surgical History:  Procedure Laterality Date  . THYROIDECTOMY    . WISDOM TOOTH EXTRACTION     Social History   Social History  . Marital status: Single    Spouse name: N/A  . Number of children: N/A   Occupational History  . Not on file.   Social  History Main Topics  . Smoking status: Never Smoker  . Smokeless tobacco: Never Used  . Alcohol use No  . Drug use: No   Current Outpatient Prescriptions on File Prior to Visit  Medication Sig Dispense Refill  . ARMOUR THYROID 60 MG tablet Take 1 tablet by mouth every other day 45 tablet 3  . ARMOUR THYROID 90 MG tablet Take 1 tablet by mouth every other day 45 tablet 3  . Ascorbic Acid (VITAMIN C PO) Take by mouth daily.    . Biotin w/ Vitamins C & E (HAIR/SKIN/NAILS PO) Take by mouth daily.    . Cholecalciferol (VITAMIN D PO) Take 5,000 Units by mouth daily.    Marland Kitchen etonogestrel-ethinyl estradiol  (NUVARING) 0.12-0.015 MG/24HR vaginal ring Place 1 each vaginally every 28 (twenty-eight) days. Insert vaginally and leave in place for 3 consecutive weeks, then remove for 1 week.    Marland Kitchen ibuprofen (ADVIL,MOTRIN) 200 MG tablet Take 200 mg by mouth every 6 (six) hours as needed. Takes 600mg  - 800mg  daily, as needed.    . meclizine (ANTIVERT) 25 MG tablet Take 1 tablet (25 mg total) by mouth 3 (three) times daily as needed for dizziness. 20 tablet 0  . metaxalone (SKELAXIN) 800 MG tablet Take 800 mg by mouth daily as needed for muscle spasms.    . rizatriptan (MAXALT) 10 MG tablet Take 10 mg by mouth as needed for migraine. May repeat in 2 hours if needed     No current facility-administered medications on file prior to visit.    No Known Allergies Family History  Problem Relation Age of Onset  . Thyroid disease Mother   . Diabetes Father   . Congestive Heart Failure Father   . Alcoholism Father    PE: BP 118/80 (BP Location: Left Arm, Patient Position: Sitting)   Pulse 75   Wt 192 lb (87.1 kg)   LMP 02/13/2017   SpO2 97%   BMI 34.56 kg/m  Wt Readings from Last 3 Encounters:  02/14/17 192 lb (87.1 kg)  10/23/16 193 lb (87.5 kg)  09/21/16 191 lb (86.6 kg)   Constitutional: overweight, in NAD Eyes: PERRLA, EOMI, no exophthalmos ENT: moist mucous membranes, Cervical scar healing, with minimal keloid and erythema, no cervical lymphadenopathy Cardiovascular: RRR, No MRG Respiratory: CTA B Gastrointestinal: abdomen soft, NT, ND, BS+ Musculoskeletal: no deformities, strength intact in all 4 Skin: moist, warm, no rashes Neurological: no tremor with outstretched hands, DTR normal in all 4  ASSESSMENT: 1. Postsurgical Hypothyroidism  2. Vitamin D deficiency  PLAN:  1. Patient with long-standing hypothyroidism, now complete, after her total thyroidectomy. She is on dessicated thyroid extract, Armour Thyroid 60 mg alternating with 90 mg every other day.  - latest thyroid labs reviewed  with pt >> normal  - pt feels good on this dose. - we discussed about taking the thyroid hormone every day, with water, >30 minutes before breakfast, separated by >4 hours from acid reflux medications, calcium, iron, multivitamins. Pt. is taking it correctly - will check thyroid tests today: TSH, fT3, and fT4 - If labs are abnormal, she will need to return for repeat TFTs in 1.5  2. Vitamin D deficiency - vit D was very low >> 12 >> started vitamin D 5000 IU daily >> I advised her to increase to 7000 (she actually started 10000 units) - recheck level today.  Component     Latest Ref Rng & Units 02/14/2017  TSH     0.35 - 4.50 uIU/mL  1.41  T4,Free(Direct)     0.60 - 1.60 ng/dL 0.74  Triiodothyronine,Free,Serum     2.3 - 4.2 pg/mL 3.8  VITD     30.00 - 100.00 ng/mL 49.42  Excellent results!  Philemon Kingdom, MD PhD [facility] Endocrinology

## 2017-02-15 LAB — T3, FREE: T3 FREE: 3.8 pg/mL (ref 2.3–4.2)

## 2017-02-15 LAB — VITAMIN D 25 HYDROXY (VIT D DEFICIENCY, FRACTURES): VITD: 49.42 ng/mL (ref 30.00–100.00)

## 2017-02-15 LAB — TSH: TSH: 1.41 u[IU]/mL (ref 0.35–4.50)

## 2017-02-15 LAB — T4, FREE: Free T4: 0.74 ng/dL (ref 0.60–1.60)

## 2017-02-20 MED FILL — ARMOUR THYROID 60 MG TABLET: 60 | 30 days supply | Qty: 15 | Fill #5

## 2017-02-20 MED FILL — ARMOUR THYROID 90 MG TABLET: 90 | 30 days supply | Qty: 15 | Fill #5

## 2017-02-20 MED FILL — RIZATRIPTAN 10 MG TABLET: 10 | 30 days supply | Qty: 9 | Fill #1

## 2017-03-19 ENCOUNTER — Other Ambulatory Visit: Payer: Self-pay | Admitting: Obstetrics and Gynecology

## 2017-03-19 DIAGNOSIS — N63 Unspecified lump in unspecified breast: Secondary | ICD-10-CM | POA: Diagnosis not present

## 2017-03-19 DIAGNOSIS — Z01411 Encounter for gynecological examination (general) (routine) with abnormal findings: Secondary | ICD-10-CM | POA: Diagnosis not present

## 2017-03-19 DIAGNOSIS — N930 Postcoital and contact bleeding: Secondary | ICD-10-CM | POA: Diagnosis not present

## 2017-03-19 DIAGNOSIS — R350 Frequency of micturition: Secondary | ICD-10-CM | POA: Diagnosis not present

## 2017-03-19 DIAGNOSIS — N644 Mastodynia: Secondary | ICD-10-CM

## 2017-03-25 ENCOUNTER — Ambulatory Visit
Admission: RE | Admit: 2017-03-25 | Discharge: 2017-03-25 | Disposition: A | Discharge status: Home / Self Care | Payer: 59 | Source: Ambulatory Visit | Attending: Obstetrics and Gynecology | Admitting: Obstetrics and Gynecology

## 2017-03-25 ENCOUNTER — Other Ambulatory Visit: Payer: Self-pay | Admitting: Obstetrics and Gynecology

## 2017-03-25 DIAGNOSIS — N6311 Unspecified lump in the right breast, upper outer quadrant: Secondary | ICD-10-CM | POA: Diagnosis not present

## 2017-03-25 DIAGNOSIS — R922 Inconclusive mammogram: Secondary | ICD-10-CM | POA: Diagnosis not present

## 2017-03-25 DIAGNOSIS — N644 Mastodynia: Secondary | ICD-10-CM

## 2017-03-25 DIAGNOSIS — R921 Mammographic calcification found on diagnostic imaging of breast: Secondary | ICD-10-CM

## 2017-03-25 DIAGNOSIS — N6313 Unspecified lump in the right breast, lower outer quadrant: Secondary | ICD-10-CM | POA: Diagnosis not present

## 2017-03-29 ENCOUNTER — Encounter: Payer: Self-pay | Admitting: Internal Medicine

## 2017-03-29 ENCOUNTER — Other Ambulatory Visit: Payer: Self-pay | Admitting: Internal Medicine

## 2017-03-29 DIAGNOSIS — E282 Polycystic ovarian syndrome: Secondary | ICD-10-CM

## 2017-03-29 MED ORDER — METFORMIN HCL ER 500 MG PO TB24: 1000.0000 mg | ORAL_TABLET | Freq: Two times a day (BID) | ORAL | 3 refills | Status: DC

## 2017-03-29 MED FILL — RIZATRIPTAN 10 MG TABLET: 10 | 10 days supply | Qty: 3 | Fill #1

## 2017-03-29 MED FILL — ARMOUR THYROID 60 MG TABLET: 60 | 30 days supply | Qty: 15 | Fill #6

## 2017-03-29 MED FILL — ARMOUR THYROID 90 MG TABLET: 90 | 30 days supply | Qty: 15 | Fill #6

## 2017-04-01 ENCOUNTER — Other Ambulatory Visit: Payer: Self-pay

## 2017-04-01 ENCOUNTER — Other Ambulatory Visit: Payer: Self-pay | Admitting: Internal Medicine

## 2017-04-01 MED ORDER — METFORMIN HCL ER 500 MG PO TB24: 1000.0000 mg | ORAL_TABLET | Freq: Two times a day (BID) | ORAL | 3 refills | Status: DC

## 2017-04-01 MED FILL — METFORMIN HCL ER 500 MG TAB: 500 | 90 days supply | Qty: 360 | Fill #0

## 2017-04-01 NOTE — Telephone Encounter (Signed)
**  Remind patient they can make refill requests via MyChart**  Medication refill request (Name & Dosage):  Metformin ER 500 mg    Preferred pharmacy (Name & Address):   [facility], [place] - [place] [phone] (Phone) [phone] (Fax)    Other comments (if applicable):   Medication was accidentally sent to the wrong pharmacy in e-mail; please change to the pharmacy above.

## 2017-04-02 ENCOUNTER — Ambulatory Visit: Payer: 59 | Admitting: Internal Medicine

## 2017-04-03 DIAGNOSIS — N930 Postcoital and contact bleeding: Secondary | ICD-10-CM | POA: Diagnosis not present

## 2017-04-03 DIAGNOSIS — R42 Dizziness and giddiness: Secondary | ICD-10-CM | POA: Diagnosis not present

## 2017-04-22 ENCOUNTER — Other Ambulatory Visit (HOSPITAL_COMMUNITY)
Admission: RE | Admit: 2017-04-22 | Discharge: 2017-04-22 | Disposition: A | Discharge status: Home / Self Care | Payer: 59 | Source: Ambulatory Visit | Attending: Obstetrics and Gynecology | Admitting: Obstetrics and Gynecology

## 2017-04-22 ENCOUNTER — Other Ambulatory Visit: Payer: Self-pay | Admitting: Obstetrics and Gynecology

## 2017-04-22 DIAGNOSIS — Z124 Encounter for screening for malignant neoplasm of cervix: Secondary | ICD-10-CM | POA: Diagnosis not present

## 2017-04-22 DIAGNOSIS — N923 Ovulation bleeding: Secondary | ICD-10-CM | POA: Insufficient documentation

## 2017-04-22 DIAGNOSIS — Z029 Encounter for administrative examinations, unspecified: Secondary | ICD-10-CM | POA: Insufficient documentation

## 2017-04-22 DIAGNOSIS — Z113 Encounter for screening for infections with a predominantly sexual mode of transmission: Secondary | ICD-10-CM | POA: Insufficient documentation

## 2017-04-23 DIAGNOSIS — Z113 Encounter for screening for infections with a predominantly sexual mode of transmission: Secondary | ICD-10-CM | POA: Diagnosis not present

## 2017-04-23 DIAGNOSIS — N923 Ovulation bleeding: Secondary | ICD-10-CM | POA: Diagnosis not present

## 2017-04-23 DIAGNOSIS — Z124 Encounter for screening for malignant neoplasm of cervix: Secondary | ICD-10-CM | POA: Diagnosis not present

## 2017-04-25 LAB — CYTOLOGY - PAP
Adequacy: ABSENT
Chlamydia: NEGATIVE
DIAGNOSIS: NEGATIVE
HPV (WINDOPATH): DETECTED — AB
HPV 16/18/45 GENOTYPING: NEGATIVE
Neisseria Gonorrhea: NEGATIVE

## 2017-04-29 ENCOUNTER — Telehealth: Payer: 59 | Admitting: Family

## 2017-04-29 DIAGNOSIS — J019 Acute sinusitis, unspecified: Secondary | ICD-10-CM | POA: Diagnosis not present

## 2017-04-29 MED ORDER — FLUTICASONE PROPIONATE 50 MCG/ACT NA SUSP: 2.0000 | Freq: Every day | NASAL | 6 refills | Status: DC

## 2017-04-29 NOTE — Progress Notes (Signed)
Thank you for the details you put in the comment boxes. Those details really help [place] take better care of you. This is more consistent with a viral infection at this point.  We are sorry that you are not feeling well.  Here is how we plan to help!  Based on what you have shared with me it looks like you have sinusitis.  Sinusitis is inflammation and infection in the sinus cavities of the head.  Based on your presentation I believe you most likely have Acute Viral Sinusitis.This is an infection most likely caused by a virus. There is not specific treatment for viral sinusitis other than to help you with the symptoms until the infection runs its course.  You may use an oral decongestant such as Mucinex D or if you have glaucoma or high blood pressure use plain Mucinex. Saline nasal spray help and can safely be used as often as needed for congestion, I have prescribed: Fluticasone nasal spray two sprays in each nostril once a day  Some authorities believe that zinc sprays or the use of Echinacea may shorten the course of your symptoms.  Sinus infections are not as easily transmitted as other respiratory infection, however we still recommend that you avoid close contact with loved ones, especially the very young and elderly.  Remember to wash your hands thoroughly throughout the day as this is the number one way to prevent the spread of infection!  Home Care:  Only take medications as instructed by your medical team.  Complete the entire course of an antibiotic.  Do not take these medications with alcohol.  A steam or ultrasonic humidifier can help congestion.  You can place a towel over your head and breathe in the steam from hot water coming from a faucet.  Avoid close contacts especially the very young and the elderly.  Cover your mouth when you cough or sneeze.  Always remember to wash your hands.  Get Help Right Away If:  You develop worsening fever or sinus pain.  You develop a severe  head ache or visual changes.  Your symptoms persist after you have completed your treatment plan.  Make sure you  Understand these instructions.  Will watch your condition.  Will get help right away if you are not doing well or get worse.  Your e-visit answers were reviewed by a board certified advanced clinical practitioner to complete your personal care plan.  Depending on the condition, your plan could have included both over the counter or prescription medications.  If there is a problem please reply  once you have received a response from your provider.  Your safety is important to [place].  If you have drug allergies check your prescription carefully.    You can use MyChart to ask questions about today's visit, request a non-urgent call back, or ask for a work or school excuse for 24 hours related to this e-Visit. If it has been greater than 24 hours you will need to follow up with your provider, or enter a new e-Visit to address those concerns.  You will get an e-mail in the next two days asking about your experience.  I hope that your e-visit has been valuable and will speed your recovery. Thank you for using e-visits.

## 2017-04-30 MED FILL — ARMOUR THYROID 90 MG TABLET: 90 | 30 days supply | Qty: 15 | Fill #7

## 2017-04-30 MED FILL — RIZATRIPTAN 10 MG TABLET: 10 | 30 days supply | Qty: 6 | Fill #0

## 2017-04-30 MED FILL — ARMOUR THYROID 60 MG TABLET: 60 | 30 days supply | Qty: 15 | Fill #7

## 2017-05-23 MED FILL — ARMOUR THYROID 90 MG TABLET: 90 | 30 days supply | Qty: 15 | Fill #8

## 2017-05-23 MED FILL — ARMOUR THYROID 60 MG TABLET: 60 | 30 days supply | Qty: 15 | Fill #8

## 2017-07-02 MED FILL — ARMOUR THYROID 60 MG TABLET: 60 | 30 days supply | Qty: 15 | Fill #9

## 2017-07-02 MED FILL — METFORMIN HCL ER 500 MG TAB: 500 | 90 days supply | Qty: 360 | Fill #1

## 2017-07-02 MED FILL — ARMOUR THYROID 90 MG TABLET: 90 | 30 days supply | Qty: 15 | Fill #9

## 2017-07-22 ENCOUNTER — Telehealth: Payer: 59 | Admitting: Family

## 2017-07-22 DIAGNOSIS — B9689 Other specified bacterial agents as the cause of diseases classified elsewhere: Secondary | ICD-10-CM

## 2017-07-22 DIAGNOSIS — J028 Acute pharyngitis due to other specified organisms: Secondary | ICD-10-CM

## 2017-07-22 MED ORDER — BENZONATATE 100 MG PO CAPS: 100.0000 mg | ORAL_CAPSULE | Freq: Three times a day (TID) | ORAL | 0 refills | Status: DC | PRN

## 2017-07-22 MED ORDER — PREDNISONE 5 MG PO TABS: 5.0000 mg | ORAL_TABLET | ORAL | 0 refills | Status: DC

## 2017-07-22 MED ORDER — AZITHROMYCIN 250 MG PO TABS: ORAL_TABLET | ORAL | 0 refills | Status: DC

## 2017-07-22 MED FILL — BENZONATATE 100 MG CAPSULE: 100 | 5 days supply | Qty: 30 | Fill #0

## 2017-07-22 MED FILL — predniSONE 5 MG TABS: 5 | 6 days supply | Qty: 21 | Fill #0

## 2017-07-22 MED FILL — AZITHROMYCIN 250 MG TABLET: 250 | 5 days supply | Qty: 6 | Fill #0

## 2017-07-22 NOTE — Progress Notes (Signed)

## 2017-07-24 ENCOUNTER — Telehealth: Payer: Self-pay | Admitting: Allergy & Immunology

## 2017-07-24 MED ORDER — FLUCONAZOLE 150 MG PO TABS: 150.0000 mg | ORAL_TABLET | Freq: Every day | ORAL | 2 refills | Status: DC

## 2017-07-24 MED FILL — AMOX-CLAV 875-125 MG TABLET: 875-125 | 10 days supply | Qty: 20 | Fill #0

## 2017-07-24 MED FILL — FLUCONAZOLE 150 MG TABLET: 150 | 2 days supply | Qty: 2 | Fill #0

## 2017-07-24 NOTE — Telephone Encounter (Signed)
Delanie called and reported that she is having symptoms of sinusitis. I called in Augmentin earlier and now she is requesting Diflucan since she tends to get yeast infections with antibiotics. This was sent in just now. Patient updated with the results.  Salvatore Marvel, MD [facility]

## 2017-07-26 DIAGNOSIS — H5213 Myopia, bilateral: Secondary | ICD-10-CM | POA: Diagnosis not present

## 2017-07-30 DIAGNOSIS — R197 Diarrhea, unspecified: Secondary | ICD-10-CM | POA: Diagnosis not present

## 2017-07-31 DIAGNOSIS — R197 Diarrhea, unspecified: Secondary | ICD-10-CM | POA: Diagnosis not present

## 2017-08-05 MED FILL — ARMOUR THYROID 60 MG TABLET: 60 | 60 days supply | Qty: 30 | Fill #10

## 2017-08-05 MED FILL — ARMOUR THYROID 90 MG TABLET: 90 | 60 days supply | Qty: 30 | Fill #10

## 2017-08-07 ENCOUNTER — Encounter: Payer: Self-pay | Admitting: Physician Assistant

## 2017-08-15 ENCOUNTER — Ambulatory Visit: Payer: 59 | Admitting: Physician Assistant

## 2017-08-15 ENCOUNTER — Encounter: Payer: Self-pay | Admitting: Physician Assistant

## 2017-08-15 ENCOUNTER — Other Ambulatory Visit: Payer: 59

## 2017-08-15 VITALS — BP 110/60 | HR 72 | Ht 62.5 in | Wt 187.6 lb

## 2017-08-15 DIAGNOSIS — R634 Abnormal weight loss: Secondary | ICD-10-CM | POA: Diagnosis not present

## 2017-08-15 DIAGNOSIS — R197 Diarrhea, unspecified: Secondary | ICD-10-CM | POA: Diagnosis not present

## 2017-08-15 DIAGNOSIS — K219 Gastro-esophageal reflux disease without esophagitis: Secondary | ICD-10-CM | POA: Diagnosis not present

## 2017-08-15 DIAGNOSIS — R194 Change in bowel habit: Secondary | ICD-10-CM | POA: Diagnosis not present

## 2017-08-15 DIAGNOSIS — R1013 Epigastric pain: Secondary | ICD-10-CM | POA: Diagnosis not present

## 2017-08-15 MED ORDER — OMEPRAZOLE 40 MG PO CPDR: 40.0000 mg | DELAYED_RELEASE_CAPSULE | Freq: Every day | ORAL | 3 refills | Status: DC

## 2017-08-15 NOTE — Progress Notes (Signed)
Chief Complaint: Change in bowel habits, loose stools  HPI:    Taylor Bennett is a 36 year old female with a past medical history as listed below, who was referred to me by Lennie Odor, PA-C for a complaint of change in bowel habits and loose stools.   Per review of chart patient was recently on Azithromycin 250 mg twice daily for 5 days for a productive cough, this was started 07/22/17.    Patient saw her primary care physician 07/31/17 and at that time had stool studies including C. difficile toxin and stool culture which were negative/normal.  Per referring physician patient describes 3 weeks of multiple bowel movements a day that were very loose as well as cramping off and on.  She had labs including a CBC and CMP which were normal.    Today, the patient presents to clinic and explains that since the beginning of November, even prior to her recent antibiotic usage for a productive cough, she was experiencing a change in her bowel habits.  Patient tells me she has had a decrease in form and increase in frequency.  Patient tells me that she ranges between a 5 and 6 on the [facility] stool scale and occasionally has a very loose watery stool.  Prior to all this the patient was "lucky to have a bowel movement a couple times a week".  Patient also describes the color of the stool varies.  Sometimes this is very dark in coloration and other times green. Patient does describe that these loose stools can sometimes wake her from sleep.      Along with this the patient has been experiencing a lot of "stomach rumbling and noises", as well as an increase in gas and burping.  Associated symptoms include an epigastric pain which radiates through to her back as well as some lower back pain, patient tells me this pain is constant throughout the day and rated as a 3/10, sometimes this will increase to an 8-9/10 right before a bowel movement and will be slightly better again afterwards.     She also describes a weight  loss of around 7-8 pounds over the past month without really trying.  Patient does tell me though that she does not eat "quite as much" as she used to, due to a feeling of early satiety.    Patient also describes increasing heartburn and reflux symptoms lately.  She does use occasional NSAIDs and does tell me she was using a large amount of Motrin when experiencing a cough a couple of weeks ago, but since then has only been using Tylenol for chronic back pain.    Patient describes a family history of cholecystectomy in her mother.    Patient denies fever, chills, nausea or vomiting.  Past Medical History:  Diagnosis Date  . Back pain   . Cervical pain   . PCOS (polycystic ovarian syndrome)   . Scoliosis   . Thyroid disease     Past Surgical History:  Procedure Laterality Date  . THYROIDECTOMY    . WISDOM TOOTH EXTRACTION      Current Outpatient Medications  Medication Sig Dispense Refill  . ARMOUR THYROID 60 MG tablet Take 1 tablet by mouth every other day 45 tablet 3  . ARMOUR THYROID 90 MG tablet Take 1 tablet by mouth every other day 45 tablet 3  . Ascorbic Acid (VITAMIN C PO) Take by mouth daily.    Marland Kitchen azithromycin (ZITHROMAX) 250 MG tablet Take 2 tabs now then  1 daily times 4 days 6 tablet 0  . benzonatate (TESSALON PERLES) 100 MG capsule Take 1-2 capsules (100-200 mg total) every 8 (eight) hours as needed by mouth for cough. 30 capsule 0  . Cholecalciferol (VITAMIN D PO) Take 5,000 Units by mouth daily.    Marland Kitchen etonogestrel-ethinyl estradiol (NUVARING) 0.12-0.015 MG/24HR vaginal ring Place 1 each vaginally every 28 (twenty-eight) days. Insert vaginally and leave in place for 3 consecutive weeks, then remove for 1 week.    . fluconazole (DIFLUCAN) 150 MG tablet Take 1 tablet (150 mg total) daily by mouth. 2 tablet 2  . fluticasone (FLONASE) 50 MCG/ACT nasal spray Place 2 sprays into both nostrils daily. 16 g 6  . ibuprofen (ADVIL,MOTRIN) 200 MG tablet Take 200 mg by mouth every 6  (six) hours as needed. Takes 600mg  - 800mg  daily, as needed.    . meclizine (ANTIVERT) 25 MG tablet Take 1 tablet (25 mg total) by mouth 3 (three) times daily as needed for dizziness. 20 tablet 0  . metaxalone (SKELAXIN) 800 MG tablet Take 800 mg by mouth daily as needed for muscle spasms.    . metFORMIN (GLUCOPHAGE-XR) 500 MG 24 hr tablet Take 2 tablets (1,000 mg total) by mouth 2 (two) times daily with a meal. 360 tablet 3  . predniSONE (DELTASONE) 5 MG tablet Take 1 tablet (5 mg total) as directed by mouth. sterapred generic taper 21 tablet 0  . rizatriptan (MAXALT) 10 MG tablet Take 10 mg by mouth as needed for migraine. May repeat in 2 hours if needed     No current facility-administered medications for this visit.     Allergies as of 08/15/2017  . (No Known Allergies)    Family History  Problem Relation Age of Onset  . Thyroid disease Mother   . Diabetes Father   . Congestive Heart Failure Father   . Alcoholism Father   . Breast cancer Maternal Aunt     Social History   Socioeconomic History  . Marital status: Single    Spouse name: Not on file  . Number of children: 2  . Years of education: 2 years college  . Highest education level: Not on file  Social Needs  . Financial resource strain: Not on file  . Food insecurity - worry: Not on file  . Food insecurity - inability: Not on file  . Transportation needs - medical: Not on file  . Transportation needs - non-medical: Not on file  Occupational History  . Occupation: ED - Admissions  Tobacco Use  . Smoking status: Former Research scientist (life sciences)  . Smokeless tobacco: Never Used  Substance and Sexual Activity  . Alcohol use: No  . Drug use: No  . Sexual activity: Not on file  Other Topics Concern  . Not on file  Social History Narrative   Lives at home with her fiance and children.   Right-handed.   1-2 cup caffeine daily.    Review of Systems:    Constitutional: No fever or chills Skin: No rash  Cardiovascular: No chest  pain Respiratory: No SOB Gastrointestinal: See HPI and otherwise negative Genitourinary: No dysuria Neurological: Positive for dizziness Musculoskeletal: No new muscle or joint pain Hematologic: No bleeding or bruising Psychiatric: No history of depression or anxiety    Physical Exam:  Vital signs: BP 110/60   Pulse 72   Ht 5' 2.5" (1.588 m)   Wt 187 lb 9.6 oz (85.1 kg)   SpO2 98%   BMI 33.77 kg/m  Constitutional:   Pleasant overweight female appears to be in NAD, Well developed, Well nourished, alert and cooperative Head:  Normocephalic and atraumatic. Eyes:   PEERL, EOMI. No icterus. Conjunctiva pink. Ears:  Normal auditory acuity. Neck:  Supple Throat: Oral cavity and pharynx without inflammation, swelling or lesion.  Respiratory: Respirations even and unlabored. Lungs clear to auscultation bilaterally.   No wheezes, crackles, or rhonchi.  Cardiovascular: Normal S1, S2. No MRG. Regular rate and rhythm. No peripheral edema, cyanosis or pallor.  Gastrointestinal:  Soft, nondistended, Moderate Epigastric ttp. No rebound or guarding. Normal bowel sounds. No appreciable masses or hepatomegaly. Rectal:  Not performed.  Msk:  Symmetrical without gross deformities. Without edema, no deformity or joint abnormality.  Neurologic:  Alert and  oriented x4;  grossly normal neurologically.  Skin:   Dry and intact without significant lesions or rashes. Psychiatric: Demonstrates good judgement and reason without abnormal affect or behaviors.  No recent labs or imaging.  Assessment: 1. Change in bowel habits: Patient describes looser stools and increased frequency of at least 4/day, sometimes waking her from sleep, stool culture and C. difficile negative by PCP, CBC and CMP within normal limits; consider gallbladder etiology versus H. pylori versus gastritis versus infectious cause versus IBS 2. Epigastric Pain: With above, also with episodes of heartburn and reflux, question relation to  Motrin usage during recent cough w/ gastritis vs PUD vs h.pylori 3.  Weight loss: Patient describes 7-8 pounds over the past month, likely due to her early satiety 4. Diarrhea: See above  Plan: 1. Ordered stool studies to include GI Pathogen panel, O&P, fecal Lactoferrin and h.pylori antigen 2.  Started patient on Omeprazole 40 mg once daily, 30-60 min before breakfast.  #30 with 1 refill.  Recommend she start this after providing stool samples above. 3.  Discussed with patient that if medicine above is not helping and stool studies are normal, may recommend a right upper quadrant ultrasound and/or EGD for further evaluation. 4.  Patient to follow with me in 2-3 weeks or sooner if necessary.  She was assigned to Dr. Havery Moros today.  Taylor Newer, PA-C [facility] 08/15/2017, 10:10 AM  Cc: Lennie Odor, PA-C

## 2017-08-15 NOTE — Patient Instructions (Signed)
Your physician has requested that you go to the basement for the following lab work before leaving today:  After you have returned stool studies start Omeprazole 40 mg daily 30-60 minutes before breakfast and dinner.

## 2017-08-16 ENCOUNTER — Telehealth: Payer: Self-pay | Admitting: Allergy & Immunology

## 2017-08-16 ENCOUNTER — Ambulatory Visit: Payer: 59 | Admitting: Internal Medicine

## 2017-08-16 DIAGNOSIS — J029 Acute pharyngitis, unspecified: Secondary | ICD-10-CM

## 2017-08-16 NOTE — Telephone Encounter (Signed)
Taylor Bennett is complaining of sore throat for 2-3 days. She does have white exudates in her mouth. To determine whether we need to send in antibiotics, we ordered a Rapid Strep.  Salvatore Marvel, MD [facility]

## 2017-08-16 NOTE — Progress Notes (Signed)
Agree with assessment and plan.  Agree with GI pathogen panel and fecal lactoferrin to ensure normal. We can screen for H pylori to assess her epigastric pain and await empiric course of PPI. If stool studies negative she can use immodium as needed and see if that helps.

## 2017-08-19 LAB — CULTURE, GROUP A STREP: Strep A Culture: NEGATIVE

## 2017-08-19 LAB — RAPID STREP SCREEN (MED CTR MEBANE ONLY): STREP GP A AG, IA W/REFLEX: NEGATIVE

## 2017-08-21 ENCOUNTER — Other Ambulatory Visit: Payer: 59

## 2017-08-30 ENCOUNTER — Ambulatory Visit: Payer: 59 | Admitting: Physician Assistant

## 2017-09-13 MED FILL — METAXALONE 800 MG TABLET: 800 | 10 days supply | Qty: 30 | Fill #0

## 2017-09-17 MED FILL — RIZATRIPTAN 10 MG TABLET: 10 | 30 days supply | Qty: 6 | Fill #1

## 2017-09-20 ENCOUNTER — Telehealth: Payer: Self-pay | Admitting: Neurology

## 2017-09-20 DIAGNOSIS — M542 Cervicalgia: Secondary | ICD-10-CM

## 2017-09-20 NOTE — Telephone Encounter (Signed)
Pt called she is experiencing numbness and tingling in the 4,5 digit radiating up the elbow, shoulder to the neck and is having some neck stiffness for the past 1-1/2 week. She has noticed swelling in the hands and arm. She complains of decreased grip, dropping objects. Pt is wearing a brace. An appt was scheduled 11/28/17. Pt was advised a note would be sent to provider to determine if it is appropriate for her to be see at the clinic 1st. She is wanting to know since appt is so far out should she go to Urgent Care. Please call to advise work number prior to 4pm then it will be cell # [phone]

## 2017-09-20 NOTE — Telephone Encounter (Signed)
I have seen patient in Feb 2018, ordered MRI cervical then, she did not have MRI cervical due to financial concerns,  Her current symptoms most suggestive cervical radiculopathy, I have ordered MRI of cervical again, will call her report, move her appointment after MRI of cervical spine

## 2017-09-23 ENCOUNTER — Telehealth: Payer: Self-pay | Admitting: Neurology

## 2017-09-23 NOTE — Telephone Encounter (Signed)
Pt called back, she can be reached at work [phone]

## 2017-09-23 NOTE — Telephone Encounter (Signed)
Pt returned Emily's call. Her office closes 12:30-1:30 she can be reached on her cell during that time, otherwise pls call work [phone]

## 2017-09-23 NOTE — Telephone Encounter (Signed)
I spoke to the patient and she is aware of her appt is tomorrow. She will arrive at 4:00 pm for the 4:30 pm exam time.

## 2017-09-23 NOTE — Telephone Encounter (Signed)
Patient is scheduled for 10/01/17 to have the MRI Cervical spine on the [facility] mobile unit.. She stated that she would like a call back to discuss the numbness that has with her arm. Please advise.

## 2017-09-23 NOTE — Telephone Encounter (Signed)
Patient has MRI scheduled for 10-01-17 but would like to reschedule for tomorrow 09-24-17 at 4pm if it is still available Please call and advise.

## 2017-09-23 NOTE — Telephone Encounter (Signed)
Pt feels she may have an issue with her left ulnar nerve.  Reports the following symptoms worsening over the last week: loss of sensation in her ring/little finger, tingling, numbness and difficulty picking up objects.  Dr. Krista Blue has offered to work her into the schedule this week for NCV/EMG.  The patient is also scheduled for a cervical MRI on 09/24/17.  States she had to pay $1500.00 for her last NCV/EMG.  She would like to wait for the MRI results and decide on the next step at that time.

## 2017-09-23 NOTE — Telephone Encounter (Signed)
I rescheduled her appt. For tomorrow 09/24/17 . I did call the patient and left her a voicemail stating that she is schedule tomorrow if she had any questions she could call me back.

## 2017-09-24 ENCOUNTER — Other Ambulatory Visit: Payer: 59

## 2017-09-24 ENCOUNTER — Ambulatory Visit: Payer: 59

## 2017-09-24 DIAGNOSIS — M542 Cervicalgia: Secondary | ICD-10-CM

## 2017-09-25 ENCOUNTER — Telehealth: Payer: Self-pay | Admitting: Neurology

## 2017-09-25 NOTE — Telephone Encounter (Signed)
I called patient cervical spine showed no significant abnormality, inflammatory markers, keep follow up in march 2019.

## 2017-09-25 NOTE — Progress Notes (Unsigned)
I have called patient, MRI of cervical spine showed no significant abnormality, she complains of diffuse joint pain, laboratory evaluation for inflammatory markers, keep follow-up appointment in March

## 2017-09-26 ENCOUNTER — Telehealth: Payer: Self-pay | Admitting: Neurology

## 2017-09-26 ENCOUNTER — Other Ambulatory Visit: Payer: Self-pay | Admitting: Neurology

## 2017-09-26 DIAGNOSIS — M542 Cervicalgia: Secondary | ICD-10-CM | POA: Diagnosis not present

## 2017-09-26 NOTE — Telephone Encounter (Signed)
Faxed over signed orders to her [facility].  She will have the labs drawn there.

## 2017-09-26 NOTE — Telephone Encounter (Signed)
Pt is wondering if she can have her lab work done through her office which also uses [facility]. Pt said a good fax number would be [phone]. If any questing please contact.

## 2017-09-27 ENCOUNTER — Encounter: Payer: Self-pay | Admitting: Internal Medicine

## 2017-09-27 ENCOUNTER — Ambulatory Visit
Admission: RE | Admit: 2017-09-27 | Discharge: 2017-09-27 | Disposition: A | Discharge status: Home / Self Care | Payer: 59 | Source: Ambulatory Visit | Attending: Obstetrics and Gynecology | Admitting: Obstetrics and Gynecology

## 2017-09-27 ENCOUNTER — Other Ambulatory Visit: Payer: Self-pay | Admitting: Obstetrics and Gynecology

## 2017-09-27 ENCOUNTER — Ambulatory Visit (INDEPENDENT_AMBULATORY_CARE_PROVIDER_SITE_OTHER): Payer: 59 | Admitting: Internal Medicine

## 2017-09-27 VITALS — BP 112/60 | HR 76 | Ht 62.5 in | Wt 187.0 lb

## 2017-09-27 DIAGNOSIS — E282 Polycystic ovarian syndrome: Secondary | ICD-10-CM

## 2017-09-27 DIAGNOSIS — E559 Vitamin D deficiency, unspecified: Secondary | ICD-10-CM | POA: Diagnosis not present

## 2017-09-27 DIAGNOSIS — E890 Postprocedural hypothyroidism: Secondary | ICD-10-CM

## 2017-09-27 DIAGNOSIS — R921 Mammographic calcification found on diagnostic imaging of breast: Secondary | ICD-10-CM

## 2017-09-27 IMAGING — MG 2D DIGITAL DIAGNOSTIC UNILATERAL RIGHT MAMMOGRAM WITH CAD AND AD
8 series · 8 of 16 positions shown · non-contrast
Comparison: [DATE]

CLINICAL DATA: Six month follow-up of probably benign
calcifications in the right breast. The patient has no new areas of
concern.

EXAM:
2D DIGITAL DIAGNOSTIC UNILATERAL RIGHT MAMMOGRAM WITH CAD AND
ADJUNCT TOMO

[R ML]
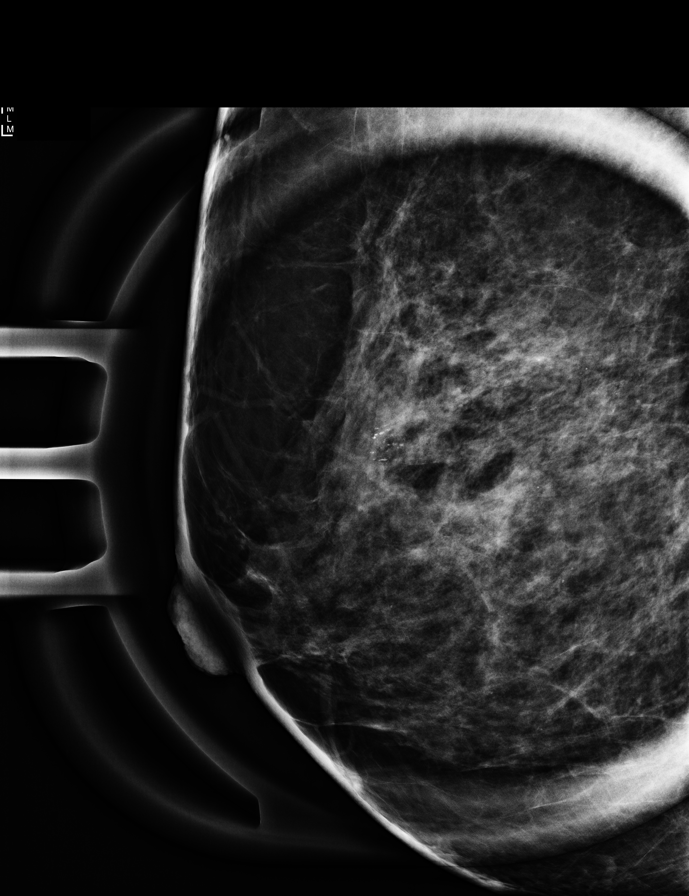

[R CC (1 of 2)]
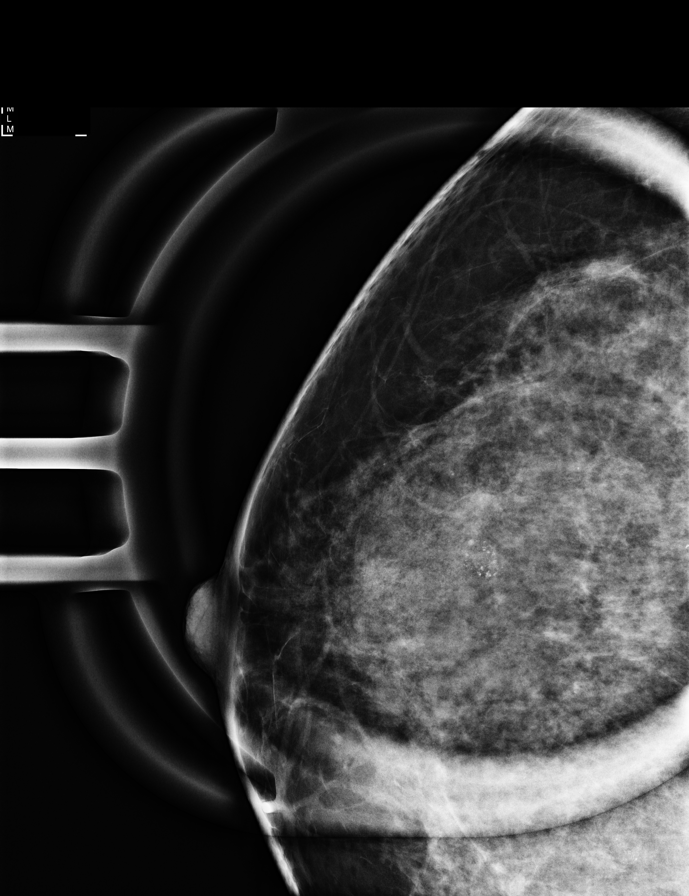

[R MLO]
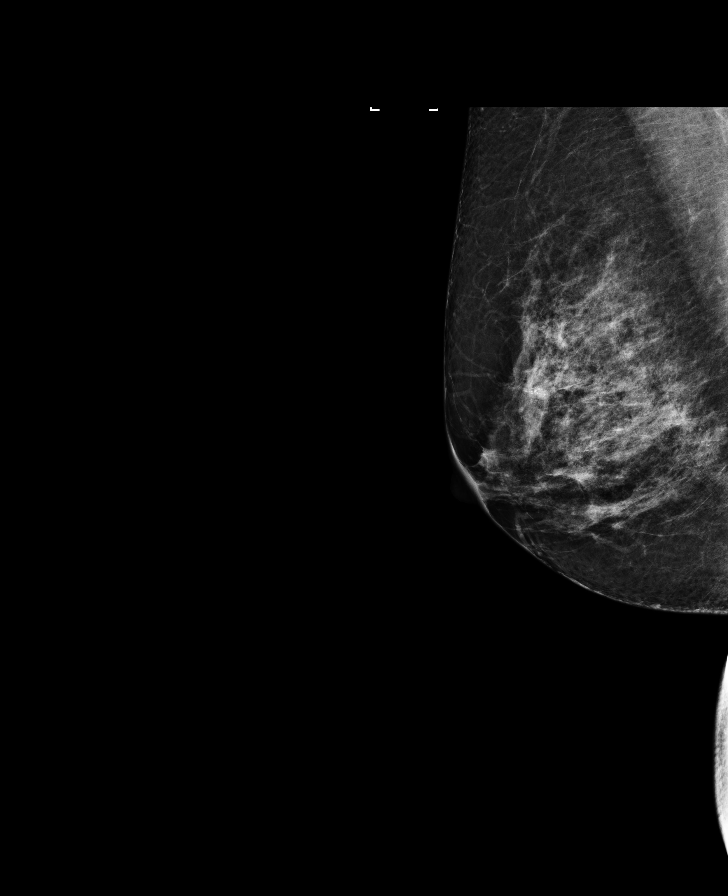

[R CC (2 of 2)]
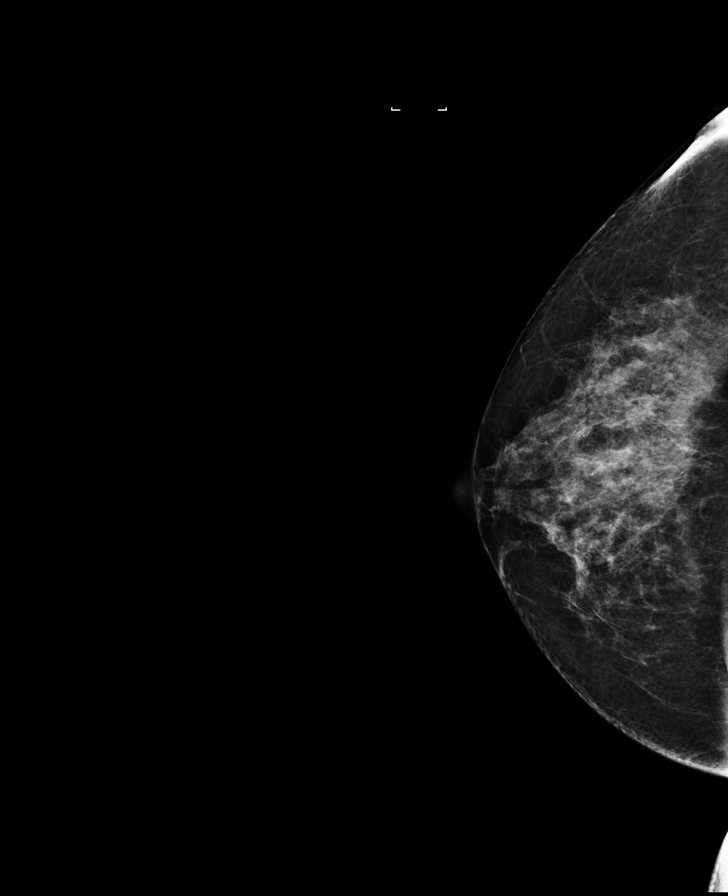

[R CC synth-2D]
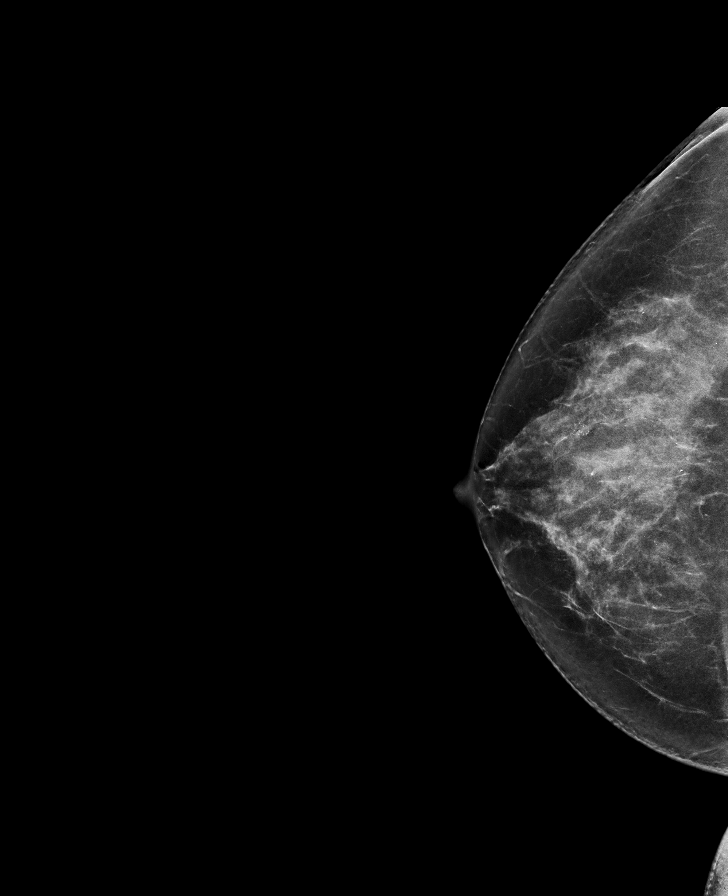

[R MLO synth-2D]
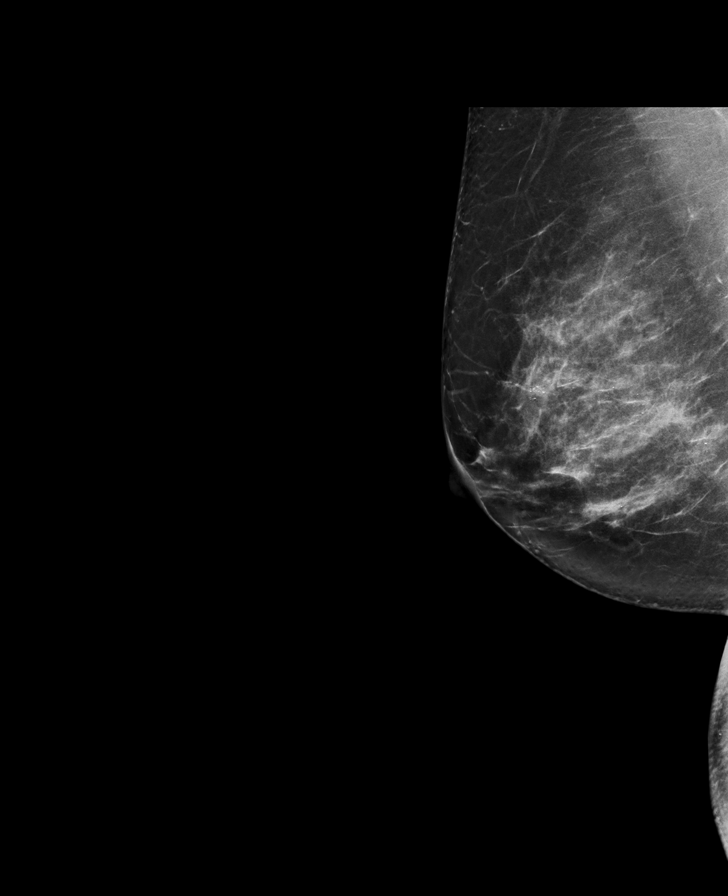

[R CC tomo · tomo slice 41/82.0]
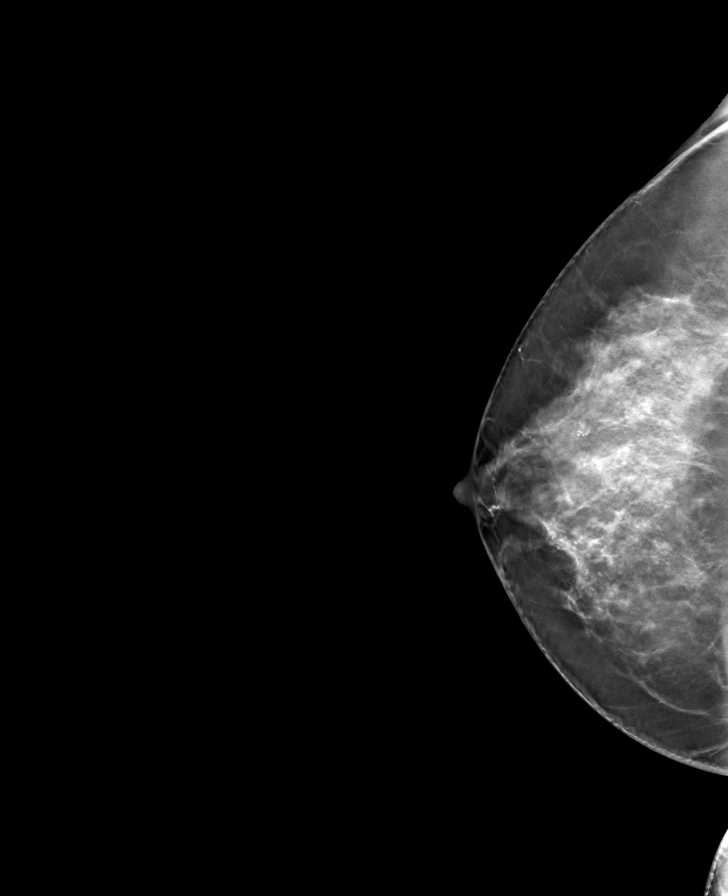

[R MLO tomo · tomo slice 44/87.0]
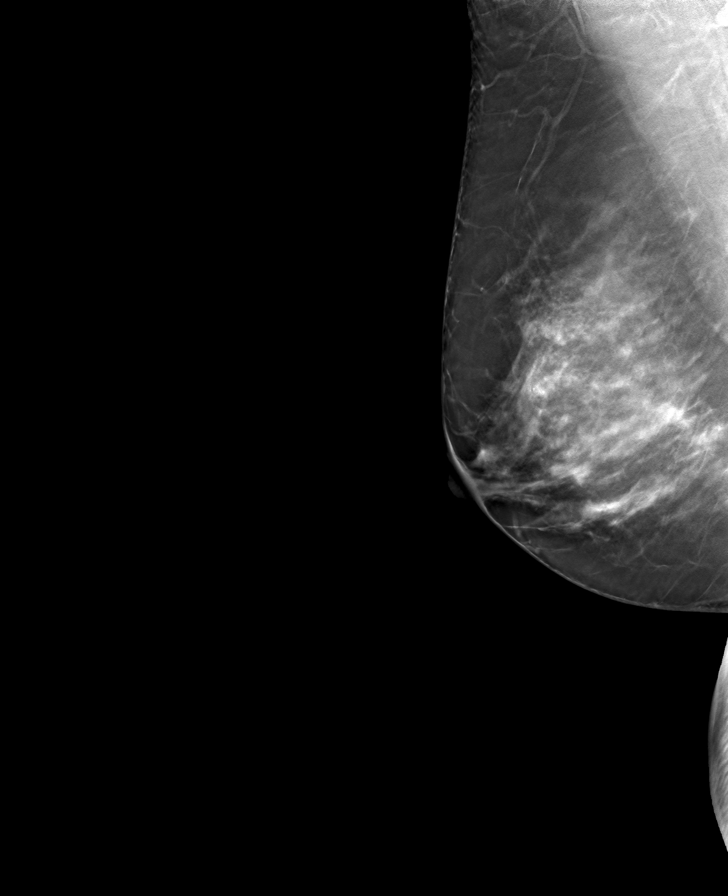

[8 of 16 positions shown; findings below may reference images not displayed]

ACR Breast Density Category c: The breast tissue is heterogeneously
dense, which may obscure small masses.
FINDINGS: No mass or architectural distortion is identified in the right
breast to suggest malignancy. Magnification views of the upper outer
right breast show a stable 5 mm group of calcifications which are
amorphous and smudgy in the CC projection, and layer in the 90
degree lateral view, consistent with benign milk of calcium. No
suspicious microcalcifications identified.

Mammographic images were processed with CAD.
IMPRESSION: Stay stable probably benign calcifications in the right breast with
imaging features suggestive of benign milk of calcium.

RECOMMENDATION:
Bilateral diagnostic mammogram is recommended in [DATE] to
complete a 1 year follow-up.

I have discussed the findings and recommendations with the patient.
Results were also provided in writing at the conclusion of the
visit. If applicable, a reminder letter will be sent to the patient
regarding the next appointment.

BI-RADS CATEGORY  3: Probably benign.

## 2017-09-27 MED ORDER — ARMOUR THYROID 60 MG PO TABS: ORAL_TABLET | ORAL | 11 refills | Status: DC

## 2017-09-27 MED ORDER — METFORMIN HCL ER 500 MG PO TB24: 1000.0000 mg | ORAL_TABLET | Freq: Two times a day (BID) | ORAL | 3 refills | Status: DC

## 2017-09-27 MED ORDER — ARMOUR THYROID 90 MG PO TABS: ORAL_TABLET | ORAL | 11 refills | Status: DC

## 2017-09-27 NOTE — Patient Instructions (Addendum)
Please restart Metformin 500 mg daily and increase slowly to 1000 mg 2x a day.  Please restart vitamin D 10,000 units a day.  Continue Armour 60 alternating with 90 mg every other day.  Take the thyroid hormone every day, with water, at least 30 minutes before breakfast, separated by at least 4 hours from: - acid reflux medications - calcium - iron - multivitamins  Please return in 1 year.

## 2017-09-27 NOTE — Progress Notes (Addendum)
I Patient ID: Taylor Bennett, female   DOB: 01-23-1981, 37 y.o.   MRN: [id number]    HPI  Taylor Bennett is a 37 y.o.-year-old female, initially referred by her PCP, Redmon, Noelle, PA-C, returning for follow-up for postsurgical hypothyroidism and vitamin D def.. She previously saw Dr. Dwyane Dee.  Last visit with me 7 months ago.  Taylor Bennett saw neurology yesterday for L arm numbness, tingling, weakness, edema. She has a h/o back pain.  Reviewed and addended history: Pt. has a h/o Hashimoto's thyroiditis and now has postsurgical hypothyroidismafter her total thyroidectomy for a suspicious nodule on 12/19/2015 (final pathology >> benign). Surgery was performed by Dr. Celine Ahr. She had compression sxs before the surgery >> now resolved.  She could not tolerate Synthroid due to significant fatigue.  Pt is on Armour 60 mg alternating with 90 mg every other day (equivalent of 125 mcg levothyroxine per day), taken: - forgot 3-4 doses in last month (!) - in am - fasting, however, if she forgets, she takes them 2 hours after breakfast (!) - at least 30 min from b'fast - no Ca, Fe, PPIs - Takes multivitamins at night - not on Biotin  I reviewed pt's thyroid tests: Lab Results  Component Value Date   TSH 1.41 02/14/2017   TSH 3.42 08/09/2016   TSH 0.94 07/28/2015   TSH 0.962 07/19/2015   TSH 0.66 06/07/2014   TSH 1.11 12/23/2013   TSH 1.813 06/12/2013   FREET4 0.74 02/14/2017   FREET4 0.52 (L) 08/09/2016   FREET4 0.60 07/28/2015   FREET4 0.77 06/07/2014   FREET4 0.68 12/23/2013   FREET4 0.94 06/12/2013   01/2016: TSH 2.6  Pt complains of: - feeling nodules in neck - hoarseness - dysphagia  She has + FH of thyroid disorders in: mother. No FH of thyroid cancer. No h/o radiation tx to head or neck.  No seaweed or kelp. No recent contrast studies. No herbal supplements. No Biotin use. No recent steroids use.   Pt. also has a history of PCOS: - restarted metformin - but stopped 2/2 diarrhea in 07/2017  >> will restart - she was on NuvaRing (per Dr. Landry Mellow), but now off for 9 months - started Ovasitol since last visit >> tried it for 2 mo >> no change >> but developped diarrhea afterwards >> stopped  She has a vitamin D deficiency:  Last level from 7 months ago was normal: Lab Results  Component Value Date   VD25OH 49.42 02/14/2017   VD25OH 24.47 (L) 10/23/2016   VD25OH 12.07 (L) 08/09/2016   We started vitamin D 5000 IU daily, then increased to 10,000 units in 10/2016. Missed doses since last visit. Vit D checked by Dr. Krista Blue yesterday.  She also had a severe vertigo episode in summer 2018>> on Meclizine.  She also has DDD in cervical spine.  ROS: Constitutional: no weight gain/+ weight loss, + fatigue, no subjective hyperthermia, no subjective hypothermia Eyes: no blurry vision, no xerophthalmia ENT: no sore throat, + see HPI Cardiovascular: no CP/no SOB/no palpitations/+ leg swelling Respiratory: no cough/no SOB/no wheezing Gastrointestinal: no N/no V/no D/no C/no acid reflux Musculoskeletal: + Muscle aches/+ joint aches Skin: no rashes, + hair loss Neurological: no tremors/+ numbness/+ tingling/no dizziness  I reviewed pt's medications, allergies, PMH, social hx, family hx, and changes were documented in the history of present illness. Otherwise, unchanged from my initial visit note.   Past Medical History:  Diagnosis Date  . Back pain   . Cervical pain   .  PCOS (polycystic ovarian syndrome)   . Scoliosis   . Thyroid disease    Past Surgical History:  Procedure Laterality Date  . THYROIDECTOMY    . WISDOM TOOTH EXTRACTION     Social History   Social History  . Marital status: Single    Spouse name: N/A  . Number of children: N/A   Occupational History  . Not on file.   Social History Main Topics  . Smoking status: Never Smoker  . Smokeless tobacco: Never Used  . Alcohol use No  . Drug use: No   Current Outpatient Medications on File Prior to Visit   Medication Sig Dispense Refill  . ARMOUR THYROID 60 MG tablet Take 1 tablet by mouth every other day 45 tablet 3  . ARMOUR THYROID 90 MG tablet Take 1 tablet by mouth every other day 45 tablet 3  . Ascorbic Acid (VITAMIN C PO) Take by mouth daily.    Marland Kitchen azithromycin (ZITHROMAX) 250 MG tablet Take 2 tabs now then 1 daily times 4 days 6 tablet 0  . Cholecalciferol (VITAMIN D PO) Take 5,000 Units by mouth daily.    Marland Kitchen etonogestrel-ethinyl estradiol (NUVARING) 0.12-0.015 MG/24HR vaginal ring Place 1 each vaginally every 28 (twenty-eight) days. Insert vaginally and leave in place for 3 consecutive weeks, then remove for 1 week.    . fluticasone (FLONASE) 50 MCG/ACT nasal spray Place 2 sprays into both nostrils daily. 16 g 6  . ibuprofen (ADVIL,MOTRIN) 200 MG tablet Take 200 mg by mouth every 6 (six) hours as needed. Takes 600mg  - 800mg  daily, as needed.    . meclizine (ANTIVERT) 25 MG tablet Take 1 tablet (25 mg total) by mouth 3 (three) times daily as needed for dizziness. 20 tablet 0  . metaxalone (SKELAXIN) 800 MG tablet Take 800 mg by mouth daily as needed for muscle spasms.    . metFORMIN (GLUCOPHAGE-XR) 500 MG 24 hr tablet Take 2 tablets (1,000 mg total) by mouth 2 (two) times daily with a meal. 360 tablet 3  . omeprazole (PRILOSEC) 40 MG capsule Take 1 capsule (40 mg total) by mouth daily before breakfast. 90 capsule 3  . predniSONE (DELTASONE) 5 MG tablet Take 1 tablet (5 mg total) as directed by mouth. sterapred generic taper 21 tablet 0  . rizatriptan (MAXALT) 10 MG tablet Take 10 mg by mouth as needed for migraine. May repeat in 2 hours if needed     No current facility-administered medications on file prior to visit.    No Known Allergies Family History  Problem Relation Age of Onset  . Thyroid disease Mother   . Diabetes Father   . Congestive Heart Failure Father   . Alcoholism Father   . Breast cancer Maternal Aunt    PE: BP 112/60   Pulse 76   Ht 5' 2.5" (1.588 m)   Wt 187 lb  (84.8 kg)   LMP 09/06/2017   SpO2 98%   BMI 33.66 kg/m  Wt Readings from Last 3 Encounters:  09/27/17 187 lb (84.8 kg)  08/15/17 187 lb 9.6 oz (85.1 kg)  02/14/17 192 lb (87.1 kg)   Constitutional: overweight, in NAD Eyes: PERRLA, EOMI, no exophthalmos ENT: moist mucous membranes, no neck masses palpable, thyroidectomy scar healing, no cervical lymphadenopathy Cardiovascular: RRR, No MRG Respiratory: CTA B Gastrointestinal: abdomen soft, NT, ND, BS+ Musculoskeletal: no deformities, strength intact in all 4 Skin: moist, warm, no rashes Neurological: no tremor with outstretched hands, DTR normal in all 4  ASSESSMENT:  1. Postsurgical Hypothyroidism  2. Vitamin D deficiency  3. PCOS  PLAN:  1. Patient with long-standing hypothyroidism, now complete after her total thyroidectomy.  She is on desiccated thyroid extract, Armour Thyroid. - latest thyroid labs reviewed with pt >> normal 02/2017 - she continues on Armour 60 alternating with 90 mg every other day - pt feels good on this dose.  She does complain of some problems swallowing and we discussed that this could be secondary to scar tissue after her surgery. - we discussed about taking the thyroid hormone every day, with water, >30 minutes before breakfast, separated by >4 hours from acid reflux medications, calcium, iron, multivitamins. Pt. is forgetting doses and occasionally takes them after meals.  We discussed about taking them every single day and always on an empty stomach. - Per her request, will check thyroid tests today (I suggested to check them in 1.5 months after starting to take Armour correctly): TSH, free T3 and fT4 - If labs are abnormal, she will need to return for repeat TFTs in 1.5 months - OTW, RTC in 1 year  2. Vitamin D deficiency - She had a very low vitamin D level, of 12, after which we started 5000 units vitamin D daily.  At last visit, she was on 10,000 units daily and a vitamin D level was normal, in  02/2017.  Therefore, we continued the same dose.  However, she tells me that she missed many doses. - She had a vitamin D level checked by her neurologist yesterday  3. PCOS - since last visit, she started Ovasitol and tolerated it well for 2 months, but then developed diarrhea, and stopped.  She is planning to restart the Ovasitol - She would also want to restart metformin ER >> refilled  Component     Latest Ref Rng & Units 10/10/2017  T4,Free(Direct)     0.82 - 1.77 ng/dL 0.89  TSH     0.450 - 4.500 uIU/mL 2.770  Triiodothyronine,Free,Serum     2.0 - 4.4 pg/mL 3.5  Normal TFTs.  Philemon Kingdom, MD PhD [facility]

## 2017-09-28 LAB — ANA W/REFLEX: Anti Nuclear Antibody(ANA): NEGATIVE

## 2017-09-28 LAB — VITAMIN B12: VITAMIN B 12: 550 pg/mL (ref 232–1245)

## 2017-09-28 LAB — RHEUMATOID ARTHRITIS PROFILE
Cyclic Citrullin Peptide Ab: 10 units (ref 0–19)
Rhuematoid fact SerPl-aCnc: 11.9 IU/mL (ref 0.0–13.9)

## 2017-09-28 LAB — C-REACTIVE PROTEIN: CRP: 12.2 mg/L — AB (ref 0.0–4.9)

## 2017-09-28 LAB — VITAMIN D 25 HYDROXY (VIT D DEFICIENCY, FRACTURES): Vit D, 25-Hydroxy: 50.7 ng/mL (ref 30.0–100.0)

## 2017-09-28 LAB — SEDIMENTATION RATE: Sed Rate: 58 mm/hr — ABNORMAL HIGH (ref 0–32)

## 2017-09-30 ENCOUNTER — Telehealth: Payer: Self-pay | Admitting: Neurology

## 2017-09-30 ENCOUNTER — Encounter: Payer: Self-pay | Admitting: Neurology

## 2017-09-30 NOTE — Telephone Encounter (Signed)
I have called patient, laboratory evaluation showed elevated C-reactive protein 12.2, ESR 58, rest of the laboratory evaluation were within normal limits, this include normal negative vitamin B12, ANA, vitamin D, rheumatoid factor, RPR  Elevated ESR C-reactive protein can be associated with polymyalgia rheumatica, patient usually presented with generalized muscle achy pain, fatigue, subjective weakness,  Current symptoms of left hand fourth and fifth finger paresthesia, and swelling and not typical for polymyalgia rheumatica,  I have discussed with patient, she wants to hold of repeat laboratory evaluation, and the potential empirically treatment with prednisone at this time,    Please also cancel her follow-up appointment in March 2019

## 2017-09-30 NOTE — Telephone Encounter (Signed)
FYI Pt has called asking that RN Sharyn Lull be made aware that between 12:30-1:30 she is at lunch and until 5:00 she is avail at work to be called

## 2017-10-01 ENCOUNTER — Other Ambulatory Visit: Payer: 59

## 2017-10-03 ENCOUNTER — Telehealth: Payer: Self-pay | Admitting: Internal Medicine

## 2017-10-03 NOTE — Telephone Encounter (Signed)
Patient needs lab order faxed to fx# [phone] ATTN: Brittani at [facility]

## 2017-10-07 MED FILL — ARMOUR THYROID 60 MG TABLET: 60 | 30 days supply | Qty: 15 | Fill #0

## 2017-10-07 MED FILL — ARMOUR THYROID 90 MG TABLET: 90 | 30 days supply | Qty: 15 | Fill #0

## 2017-10-07 MED FILL — FLUCONAZOLE 150 MG TABLET: 150 | 2 days supply | Qty: 2 | Fill #1

## 2017-10-07 NOTE — Telephone Encounter (Signed)
Will be faxed over today for the pt

## 2017-10-10 ENCOUNTER — Other Ambulatory Visit: Payer: Self-pay | Admitting: Internal Medicine

## 2017-10-10 DIAGNOSIS — E890 Postprocedural hypothyroidism: Secondary | ICD-10-CM | POA: Diagnosis not present

## 2017-10-11 LAB — T4, FREE: Free T4: 0.89 ng/dL (ref 0.82–1.77)

## 2017-10-11 LAB — T3, FREE: T3, Free: 3.5 pg/mL (ref 2.0–4.4)

## 2017-10-11 LAB — TSH: TSH: 2.77 u[IU]/mL (ref 0.450–4.500)

## 2017-11-11 MED FILL — ARMOUR THYROID 90 MG TABLET: 90 | 30 days supply | Qty: 15 | Fill #1

## 2017-11-11 MED FILL — ARMOUR THYROID 60 MG TABLET: 60 | 30 days supply | Qty: 15 | Fill #1

## 2017-11-28 ENCOUNTER — Ambulatory Visit: Payer: 59 | Admitting: Neurology

## 2017-12-04 MED FILL — METAXALONE 800 MG TABLET: 800 | 10 days supply | Qty: 30 | Fill #0

## 2017-12-05 ENCOUNTER — Ambulatory Visit (INDEPENDENT_AMBULATORY_CARE_PROVIDER_SITE_OTHER): Payer: 59 | Admitting: Surgery

## 2017-12-17 MED FILL — ARMOUR THYROID 60 MG TABLET: 60 | 30 days supply | Qty: 15 | Fill #2

## 2017-12-17 MED FILL — ARMOUR THYROID 90 MG TABLET: 90 | 30 days supply | Qty: 15 | Fill #2

## 2018-01-20 MED FILL — ARMOUR THYROID 60 MG TABLET: 60 | 30 days supply | Qty: 15 | Fill #3

## 2018-01-20 MED FILL — ARMOUR THYROID 90 MG TABLET: 90 | 30 days supply | Qty: 15 | Fill #3

## 2018-02-24 MED FILL — ARMOUR THYROID 60 MG TABLET: 60 | 30 days supply | Qty: 15 | Fill #4 | Status: TO

## 2018-02-24 MED FILL — ARMOUR THYROID 90 MG TABLET: 90 | 30 days supply | Qty: 15 | Fill #4 | Status: TO

## 2018-03-10 DIAGNOSIS — M5030 Other cervical disc degeneration, unspecified cervical region: Secondary | ICD-10-CM | POA: Diagnosis not present

## 2018-03-10 DIAGNOSIS — M549 Dorsalgia, unspecified: Secondary | ICD-10-CM | POA: Diagnosis not present

## 2018-03-10 DIAGNOSIS — M419 Scoliosis, unspecified: Secondary | ICD-10-CM | POA: Diagnosis not present

## 2018-03-10 DIAGNOSIS — M47812 Spondylosis without myelopathy or radiculopathy, cervical region: Secondary | ICD-10-CM | POA: Diagnosis not present

## 2018-03-10 DIAGNOSIS — R42 Dizziness and giddiness: Secondary | ICD-10-CM | POA: Diagnosis not present

## 2018-03-10 DIAGNOSIS — M5136 Other intervertebral disc degeneration, lumbar region: Secondary | ICD-10-CM | POA: Diagnosis not present

## 2018-03-10 DIAGNOSIS — R51 Headache: Secondary | ICD-10-CM | POA: Diagnosis not present

## 2018-04-01 ENCOUNTER — Other Ambulatory Visit: Payer: Self-pay | Admitting: Obstetrics and Gynecology

## 2018-04-01 ENCOUNTER — Other Ambulatory Visit (HOSPITAL_COMMUNITY)
Admission: RE | Admit: 2018-04-01 | Discharge: 2018-04-01 | Disposition: A | Discharge status: Home / Self Care | Payer: 59 | Source: Ambulatory Visit | Attending: Obstetrics and Gynecology | Admitting: Obstetrics and Gynecology

## 2018-04-01 DIAGNOSIS — R102 Pelvic and perineal pain: Secondary | ICD-10-CM | POA: Diagnosis not present

## 2018-04-01 DIAGNOSIS — B977 Papillomavirus as the cause of diseases classified elsewhere: Secondary | ICD-10-CM | POA: Diagnosis not present

## 2018-04-01 DIAGNOSIS — Z01411 Encounter for gynecological examination (general) (routine) with abnormal findings: Secondary | ICD-10-CM | POA: Diagnosis not present

## 2018-04-01 DIAGNOSIS — N930 Postcoital and contact bleeding: Secondary | ICD-10-CM | POA: Diagnosis not present

## 2018-04-01 DIAGNOSIS — N923 Ovulation bleeding: Secondary | ICD-10-CM | POA: Diagnosis not present

## 2018-04-02 ENCOUNTER — Encounter: Payer: Self-pay | Admitting: Internal Medicine

## 2018-04-02 MED FILL — ARMOUR THYROID 60 MG TABLET: 60 | 30 days supply | Qty: 15 | Fill #0

## 2018-04-02 MED FILL — ARMOUR THYROID 90 MG TABLET: 90 | 30 days supply | Qty: 15 | Fill #0

## 2018-04-03 ENCOUNTER — Other Ambulatory Visit: Payer: Self-pay | Admitting: Internal Medicine

## 2018-04-03 DIAGNOSIS — E282 Polycystic ovarian syndrome: Secondary | ICD-10-CM

## 2018-04-03 DIAGNOSIS — E890 Postprocedural hypothyroidism: Secondary | ICD-10-CM

## 2018-04-03 LAB — CYTOLOGY - PAP
DIAGNOSIS: NEGATIVE
HPV (WINDOPATH): NOT DETECTED

## 2018-04-25 DIAGNOSIS — R350 Frequency of micturition: Secondary | ICD-10-CM | POA: Diagnosis not present

## 2018-04-25 DIAGNOSIS — R102 Pelvic and perineal pain: Secondary | ICD-10-CM | POA: Diagnosis not present

## 2018-04-25 DIAGNOSIS — N83292 Other ovarian cyst, left side: Secondary | ICD-10-CM | POA: Diagnosis not present

## 2018-04-25 DIAGNOSIS — N923 Ovulation bleeding: Secondary | ICD-10-CM | POA: Diagnosis not present

## 2018-04-29 ENCOUNTER — Telehealth: Payer: Self-pay | Admitting: Internal Medicine

## 2018-04-29 NOTE — Telephone Encounter (Signed)
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

## 2018-04-29 NOTE — Telephone Encounter (Signed)
Faxed

## 2018-05-02 ENCOUNTER — Other Ambulatory Visit: Payer: Self-pay | Admitting: Internal Medicine

## 2018-05-02 DIAGNOSIS — E890 Postprocedural hypothyroidism: Secondary | ICD-10-CM | POA: Diagnosis not present

## 2018-05-02 MED FILL — ARMOUR THYROID 90 MG TABLET: 90 | 30 days supply | Qty: 15 | Fill #1

## 2018-05-02 MED FILL — ARMOUR THYROID 60 MG TABLET: 60 | 30 days supply | Qty: 15 | Fill #1

## 2018-05-03 LAB — T3: T3, Total: 162 ng/dL (ref 71–180)

## 2018-05-03 LAB — T4, FREE: Free T4: 0.8 ng/dL — ABNORMAL LOW (ref 0.82–1.77)

## 2018-05-03 LAB — TSH: TSH: 5.34 u[IU]/mL — ABNORMAL HIGH (ref 0.450–4.500)

## 2018-05-05 ENCOUNTER — Other Ambulatory Visit: Payer: Self-pay | Admitting: Internal Medicine

## 2018-05-05 DIAGNOSIS — E890 Postprocedural hypothyroidism: Secondary | ICD-10-CM

## 2018-05-09 ENCOUNTER — Other Ambulatory Visit: Payer: 59

## 2018-06-03 MED FILL — ARMOUR THYROID 90 MG TABLET: 90 | 30 days supply | Qty: 15 | Fill #2

## 2018-06-03 MED FILL — ARMOUR THYROID 60 MG TABLET: 60 | 30 days supply | Qty: 15 | Fill #2

## 2018-06-06 DIAGNOSIS — J029 Acute pharyngitis, unspecified: Secondary | ICD-10-CM | POA: Diagnosis not present

## 2018-06-06 DIAGNOSIS — Z2089 Contact with and (suspected) exposure to other communicable diseases: Secondary | ICD-10-CM | POA: Diagnosis not present

## 2018-06-20 DIAGNOSIS — R102 Pelvic and perineal pain: Secondary | ICD-10-CM | POA: Diagnosis not present

## 2018-06-20 DIAGNOSIS — N83202 Unspecified ovarian cyst, left side: Secondary | ICD-10-CM | POA: Diagnosis not present

## 2018-07-01 MED FILL — ARMOUR THYROID 60 MG TABLET: 60 | 30 days supply | Qty: 15 | Fill #0

## 2018-07-01 MED FILL — ARMOUR THYROID 90 MG TABLET: 90 | 30 days supply | Qty: 15 | Fill #0

## 2018-07-25 ENCOUNTER — Other Ambulatory Visit (INDEPENDENT_AMBULATORY_CARE_PROVIDER_SITE_OTHER): Payer: 59

## 2018-07-25 ENCOUNTER — Telehealth: Payer: Self-pay

## 2018-07-25 DIAGNOSIS — E559 Vitamin D deficiency, unspecified: Secondary | ICD-10-CM

## 2018-07-25 DIAGNOSIS — M47812 Spondylosis without myelopathy or radiculopathy, cervical region: Secondary | ICD-10-CM | POA: Diagnosis not present

## 2018-07-25 DIAGNOSIS — E890 Postprocedural hypothyroidism: Secondary | ICD-10-CM

## 2018-07-25 DIAGNOSIS — M5030 Other cervical disc degeneration, unspecified cervical region: Secondary | ICD-10-CM | POA: Diagnosis not present

## 2018-07-25 DIAGNOSIS — R42 Dizziness and giddiness: Secondary | ICD-10-CM | POA: Diagnosis not present

## 2018-07-25 DIAGNOSIS — M419 Scoliosis, unspecified: Secondary | ICD-10-CM | POA: Diagnosis not present

## 2018-07-25 DIAGNOSIS — R51 Headache: Secondary | ICD-10-CM | POA: Diagnosis not present

## 2018-07-25 LAB — T4, FREE: FREE T4: 0.69 ng/dL (ref 0.60–1.60)

## 2018-07-25 LAB — TSH: TSH: 2.49 u[IU]/mL (ref 0.35–4.50)

## 2018-07-25 LAB — T3, FREE: T3, Free: 3.8 pg/mL (ref 2.3–4.2)

## 2018-07-25 NOTE — Telephone Encounter (Signed)
-----   Message from Philemon Kingdom, MD sent at 07/25/2018  3:05 PM EST ----- Regarding: RE: lab OK - Melissa, Can you plz order this? Ty, C ----- Message ----- From: Kaylyn Lim I Sent: 07/25/2018   2:51 PM EST To: Philemon Kingdom, MD Subject: lab                                            Patients wants to know if you would order a vit d level.  Has been taken vit d supplement.

## 2018-07-28 LAB — VITAMIN D 25 HYDROXY (VIT D DEFICIENCY, FRACTURES): VITD: 24.33 ng/mL — AB (ref 30.00–100.00)

## 2018-08-01 MED FILL — ARMOUR THYROID 60 MG TABLET: 60 | 30 days supply | Qty: 15 | Fill #1

## 2018-08-01 MED FILL — ARMOUR THYROID 90 MG TABLET: 90 | 30 days supply | Qty: 15 | Fill #1

## 2018-08-29 MED FILL — ARMOUR THYROID 90 MG TABLET: 90 | 30 days supply | Qty: 15 | Fill #2

## 2018-08-29 MED FILL — ARMOUR THYROID 60 MG TABLET: 60 | 30 days supply | Qty: 15 | Fill #2

## 2018-09-26 ENCOUNTER — Encounter: Payer: Self-pay | Admitting: Internal Medicine

## 2018-09-26 ENCOUNTER — Ambulatory Visit (INDEPENDENT_AMBULATORY_CARE_PROVIDER_SITE_OTHER): Payer: 59 | Admitting: Internal Medicine

## 2018-09-26 VITALS — BP 120/76 | HR 78 | Ht 62.5 in | Wt 194.0 lb

## 2018-09-26 DIAGNOSIS — E282 Polycystic ovarian syndrome: Secondary | ICD-10-CM | POA: Diagnosis not present

## 2018-09-26 DIAGNOSIS — E890 Postprocedural hypothyroidism: Secondary | ICD-10-CM | POA: Diagnosis not present

## 2018-09-26 DIAGNOSIS — R5383 Other fatigue: Secondary | ICD-10-CM | POA: Diagnosis not present

## 2018-09-26 DIAGNOSIS — E559 Vitamin D deficiency, unspecified: Secondary | ICD-10-CM

## 2018-09-26 LAB — VITAMIN B12: Vitamin B-12: 310 pg/mL (ref 211–911)

## 2018-09-26 LAB — HEMOGLOBIN A1C: Hgb A1c MFr Bld: 5.5 % (ref 4.6–6.5)

## 2018-09-26 LAB — COMPLETE METABOLIC PANEL WITH GFR
AG Ratio: 1.5 (calc) (ref 1.0–2.5)
ALT: 12 U/L (ref 6–29)
AST: 14 U/L (ref 10–30)
Albumin: 4 g/dL (ref 3.6–5.1)
Alkaline phosphatase (APISO): 60 U/L (ref 33–115)
BUN: 14 mg/dL (ref 7–25)
CHLORIDE: 104 mmol/L (ref 98–110)
CO2: 26 mmol/L (ref 20–32)
Calcium: 9.3 mg/dL (ref 8.6–10.2)
Creat: 0.68 mg/dL (ref 0.50–1.10)
GFR, Est African American: 128 mL/min/{1.73_m2} (ref >=60)
GFR, Est Non African American: 110 mL/min/{1.73_m2} (ref >=60)
GLUCOSE: 80 mg/dL (ref 65–99)
Globulin: 2.7 g/dL (calc) (ref 1.9–3.7)
Potassium: 4.1 mmol/L (ref 3.5–5.3)
Sodium: 138 mmol/L (ref 135–146)
Total Bilirubin: 0.3 mg/dL (ref 0.2–1.2)
Total Protein: 6.7 g/dL (ref 6.1–8.1)

## 2018-09-26 LAB — LIPID PANEL
Cholesterol: 171 mg/dL (ref 0–200)
HDL: 51 mg/dL (ref >39.00)
LDL Cholesterol: 104 mg/dL — ABNORMAL HIGH (ref 0–99)
NonHDL: 119.73
Total CHOL/HDL Ratio: 3
Triglycerides: 81 mg/dL (ref 0.0–149.0)
VLDL: 16.2 mg/dL (ref 0.0–40.0)

## 2018-09-26 LAB — T4, FREE: FREE T4: 0.7 ng/dL (ref 0.60–1.60)

## 2018-09-26 LAB — T3, FREE: T3 FREE: 3.8 pg/mL (ref 2.3–4.2)

## 2018-09-26 LAB — VITAMIN D 25 HYDROXY (VIT D DEFICIENCY, FRACTURES): VITD: 32.22 ng/mL (ref 30.00–100.00)

## 2018-09-26 LAB — TSH: TSH: 2.42 u[IU]/mL (ref 0.35–4.50)

## 2018-09-26 NOTE — Patient Instructions (Signed)
Please stop at the lab.  Please look up Resveratrol- 1500 mg daily.  Continue Armour 90 mg with 60 mg every other day.  Take the thyroid hormone every day, with water, at least 30 minutes before breakfast, separated by at least 4 hours from: - acid reflux medications - calcium - iron - multivitamins  Please come back for a follow-up appointment in 1 year.

## 2018-09-26 NOTE — Progress Notes (Signed)
I Patient ID: Taylor Bennett, female   DOB: Apr 20, 1981, 38 y.o.   MRN: [id number]    HPI  Taylor Bennett is a 38 y.o.-year-oldr-old female, initially referred by her PCP, Redmon, Noelle, PA-C, returning for follow-up for postsurgical hypothyroidism and vitamin D def.. She previously saw Dr. Dwyane Dee.  Last visit with me a year ago.  Lately she has been feeling more fatigued, lethargic, and having dizzy spells.   Reviewed and addended hx: Pt. has a h/o Hashimoto's thyroiditis and now has postsurgical hypothyroidismafter her total thyroidectomy for a suspicious nodule on 12/19/2015 (final pathology >> benign). Surgery was performed by Dr. Celine Ahr. She had compression sxs before the surgery >> now resolved.  She could not tolerate Synthroid due to significant fatigue.Taylor Bennett  Pt is on Armour 60 alternating with 90 mg qod (equivalent of 125 mcg levothyroxine per day): - in am - was taking them occasionally after b'fast at last visit! - fasting - at least 60 min from b'fast - no Ca, Fe, PPIs, MVI - not on Biotin  Reviewed TFTs: Lab Results  Component Value Date   TSH 2.49 07/25/2018   TSH 5.340 (H) 05/02/2018   TSH 2.770 10/10/2017   TSH 1.41 02/14/2017   TSH 3.42 08/09/2016   TSH 0.94 07/28/2015   TSH 0.962 07/19/2015   TSH 0.66 06/07/2014   TSH 1.11 12/23/2013   TSH 1.813 06/12/2013   FREET4 0.69 07/25/2018   FREET4 0.80 (L) 05/02/2018   FREET4 0.89 10/10/2017   FREET4 0.74 02/14/2017   FREET4 0.52 (L) 08/09/2016   FREET4 0.60 07/28/2015   FREET4 0.77 06/07/2014   FREET4 0.68 12/23/2013   FREET4 0.94 06/12/2013   01/2016: TSH 2.6  Pt denies: - feeling nodules in neck - hoarseness - dysphagia - choking - SOB with lying down She feels something stuck in her throat.  She has + FH of thyroid disorders in: mother. No FH of thyroid cancer. No h/o radiation tx to head or neck.  kNo herbal supplements. No Biotin use. No recent steroids use.   PCOS: -She was on metformin- but stopped 2/2  diarrhea in 07/2017 >> restarted 09/2017 but stopped that she did not like how this made her feel. -She was on NuvaRing (per Dr. Landry Mellow) but now off for almost 2 years -Restarted Ovasitol 09/2017 but did not continue it -Menstrual cycles are regular -No hirsutism or acne, but does have hair loss  Vitamin D deficiency:  Latest level was low 2 months ago: Lab Results  Component Value Date   VD25OH 24.33 (L) 07/25/2018   VD25OH 50.7 09/26/2017   VD25OH 49.42 02/14/2017   VD25OH 24.47 (L) 10/23/2016   VD25OH 12.07 (L) 08/09/2016   She continues on 5,000 units vitamin D daily.  Previously on 10,000 units daily.  She also had a severe vertigo episode in summer 2018 >> on Meclizine.  She also has DDD in cervical spine.  She has CP and SOB with ecxertion.  ROS: Constitutional: no weight gain/no weight loss, + fatigue, no subjective hyperthermia, no subjective hypothermia, + nocturia Eyes: no blurry vision, no xerophthalmia ENT: + Sore throat, + see HPI Cardiovascular: + CP/+ SOB/+ palpitations/no leg swelling Respiratory: no cough/+ SOB/no wheezing Gastrointestinal: no N/no V/no D/no C/no acid reflux Musculoskeletal: + Muscle aches/+ joint aches Skin: no rashes, + hair loss Neurological: no tremors/no numbness/no tingling/no dizziness, + headache  I reviewed pt's medications, allergies, PMH, social hx, family hx, and changes were documented in the history of present illness. Otherwise, unchanged  from my initial visit note.  Past Medical History:  Diagnosis Date  . Back pain   . Cervical pain   . PCOS (polycystic ovarian syndrome)   . Scoliosis   . Thyroid disease    Past Surgical History:  Procedure Laterality Date  . THYROIDECTOMY    . WISDOM TOOTH EXTRACTION     Social History   Social History  . Marital status: Single    Spouse name: N/A  . Number of children: N/A   Occupational History  . Not on file.   Social History Main Topics  . Smoking status: Never Smoker   . Smokeless tobacco: Never Used  . Alcohol use No  . Drug use: No   Current Outpatient Medications on File Prior to Visit  Medication Sig Dispense Refill  . ARMOUR THYROID 60 MG tablet Take 1 tablet by mouth every other day 15 tablet 11  . ARMOUR THYROID 90 MG tablet Take 1 tablet by mouth every other day 15 tablet 11  . Ascorbic Acid (VITAMIN C PO) Take by mouth daily.    . Cholecalciferol (VITAMIN D PO) Take 5,000 Units by mouth daily.    Taylor Bennett ibuprofen (ADVIL,MOTRIN) 200 MG tablet Take 200 mg by mouth every 6 (six) hours as needed. Takes 600mg  - 800mg  daily, as needed.    . meclizine (ANTIVERT) 25 MG tablet Take 1 tablet (25 mg total) by mouth 3 (three) times daily as needed for dizziness. 20 tablet 0  . metaxalone (SKELAXIN) 800 MG tablet Take 800 mg by mouth daily as needed for muscle spasms.    . metFORMIN (GLUCOPHAGE-XR) 500 MG 24 hr tablet Take 2 tablets (1,000 mg total) by mouth 2 (two) times daily with a meal. 360 tablet 3  . rizatriptan (MAXALT) 10 MG tablet Take 10 mg by mouth as needed for migraine. May repeat in 2 hours if needed     No current facility-administered medications on file prior to visit.    No Known Allergies Family History  Problem Relation Age of Onset  . Thyroid disease Mother   . Diabetes Father   . Congestive Heart Failure Father   . Alcoholism Father   . Breast cancer Maternal Aunt    PE: BP 120/76   Pulse 78   Ht 5' 2.5" (1.588 m)   Wt 194 lb (88 kg)   LMP 09/02/2018   SpO2 98%   BMI 34.92 kg/m  Wt Readings from Last 3 Encounters:  09/26/18 194 lb (88 kg)  09/27/17 187 lb (84.8 kg)  08/15/17 187 lb 9.6 oz (85.1 kg)   Constitutional: overweight, in NAD Eyes: PERRLA, EOMI, no exophthalmos ENT: moist mucous membranes, no neck masses, thyroidectomy scar healed, no cervical lymphadenopathy Cardiovascular: RRR, No MRG Respiratory: CTA B Gastrointestinal: abdomen soft, NT, ND, BS+ Musculoskeletal: no deformities, strength intact in all  4 Skin: moist, warm, no rashes Neurological: no tremor with outstretched hands, DTR normal in all 4  ASSESSMENT: 1. Postsurgical Hypothyroidism  2. Vitamin D deficiency  3. PCOS  4.  Fatigue  PLAN:  1. Patient with longstanding hypothyroidism, now complete after her total thyroidectomy.  She is on Armour Thyroid alternating 90 with 60 mg every other day - latest thyroid labs reviewed with pt >> normal 07/2018 - pt feels good on this dose, but does have significant fatigue. - we discussed about taking the thyroid hormone every day, with water, >30 minutes before breakfast, separated by >4 hours from acid reflux medications, calcium, iron, multivitamins. Pt.  is taking it correctly now, but was forgetting doses at last visit or occasionally taking them after meals - will check thyroid tests today: TSH, free T3, and fT4 - If labs are abnormal, she will need to return for repeat TFTs in 1.5 months  2. Vitamin D deficiency -She had a very low vitamin D level, of 12, for which we started vitamin D 5000 units daily.  She then increase the dose to 10,000 units daily and a vitamin D level was normal in 02/2017.  Therefore, we continue the same dose.  She was missing many doses as per her report at last visit.  She is not missing doses now -She tells me that she is now taking 10,000 units daily consistently -Her level was normal in 09/2017, however, it was low in 07/2018 -We will recheck another vitamin D level today  3. PCOS - + wt gain 8 lbs -She restarted Ovasitol after last visit >> stopped -She also wanted to restart metformin ER at last visit so I refilled this >> stopped as she did not like the way it made her feel -At this visit, we will check an HbA1c along with CMP and lipids  4.  Fatigue -Recommended to also see PCP for this, but for now, will check CMP, TFTs, lipids, vitamin D and vitamin B12  Needs refills.  Component     Latest Ref Rng & Units 09/26/2018  Glucose     65 -  99 mg/dL 80  BUN     7 - 25 mg/dL 14  Creatinine     0.50 - 1.10 mg/dL 0.68  GFR, Est Non African American     > OR = 60 mL/min/1.15m2 110  GFR, Est African American     > OR = 60 mL/min/1.55m2 128  BUN/Creatinine Ratio     6 - 22 (calc) NOT APPLICABLE  Sodium     979 - 146 mmol/L 138  Potassium     3.5 - 5.3 mmol/L 4.1  Chloride     98 - 110 mmol/L 104  CO2     20 - 32 mmol/L 26  Calcium     8.6 - 10.2 mg/dL 9.3  Total Protein     6.1 - 8.1 g/dL 6.7  Albumin MSPROF     3.6 - 5.1 g/dL 4.0  Globulin     1.9 - 3.7 g/dL (calc) 2.7  AG Ratio     1.0 - 2.5 (calc) 1.5  Total Bilirubin     0.2 - 1.2 mg/dL 0.3  Alkaline phosphatase (APISO)     33 - 115 U/L 60  AST     10 - 30 U/L 14  ALT     6 - 29 U/L 12  Cholesterol     0 - 200 mg/dL 171  Triglycerides     0.0 - 149.0 mg/dL 81.0  HDL Cholesterol     >39.00 mg/dL 51.00  VLDL     0.0 - 40.0 mg/dL 16.2  LDL (calc)     0 - 99 mg/dL 104 (H)  Total CHOL/HDL Ratio      3  NonHDL      119.73  T4,Free(Direct)     0.60 - 1.60 ng/dL 0.70  TSH     0.35 - 4.50 uIU/mL 2.42  Triiodothyronine,Free,Serum     2.3 - 4.2 pg/mL 3.8  VITD     30.00 - 100.00 ng/mL 32.22  Vitamin B12     211 - 911 pg/mL  310  Hemoglobin A1C     4.6 - 6.5 % 5.5   Thyroid tests, vitamin D, HbA1c, CMP, lipids, normal, with the exception of a slightly high LDL. Vitamin B12 is normal but at the lower end of normal.  I would recommend to start 1000 mcg B12 daily.  Philemon Kingdom, MD PhD [facility]

## 2018-09-29 ENCOUNTER — Other Ambulatory Visit: Payer: Self-pay | Admitting: Internal Medicine

## 2018-09-29 ENCOUNTER — Telehealth: Payer: Self-pay | Admitting: Endocrinology

## 2018-09-29 MED ORDER — ARMOUR THYROID 90 MG PO TABS: 90.0000 mg | ORAL_TABLET | ORAL | 3 refills | Status: DC

## 2018-09-29 MED ORDER — ARMOUR THYROID 60 MG PO TABS: 60.0000 mg | ORAL_TABLET | ORAL | 3 refills | Status: DC

## 2018-09-29 MED FILL — ARMOUR THYROID 90 MG TABLET: 90 | 90 days supply | Qty: 45 | Fill #0

## 2018-09-29 MED FILL — ARMOUR THYROID 60 MG TABLET: 60 | 90 days supply | Qty: 45 | Fill #0

## 2018-09-29 NOTE — Telephone Encounter (Signed)
duplicate

## 2018-09-29 NOTE — Addendum Note (Signed)
Addended by: Philemon Kingdom on: 09/29/2018 04:57 PM   Modules accepted: Level of Service

## 2018-10-06 ENCOUNTER — Ambulatory Visit
Admission: RE | Admit: 2018-10-06 | Discharge: 2018-10-06 | Disposition: A | Discharge status: Home / Self Care | Payer: 59 | Source: Ambulatory Visit | Attending: Physician Assistant | Admitting: Physician Assistant

## 2018-10-06 ENCOUNTER — Other Ambulatory Visit: Payer: Self-pay | Admitting: Physician Assistant

## 2018-10-06 DIAGNOSIS — R0789 Other chest pain: Secondary | ICD-10-CM | POA: Diagnosis not present

## 2018-10-06 DIAGNOSIS — R079 Chest pain, unspecified: Secondary | ICD-10-CM

## 2018-10-06 IMAGING — DX DG CHEST 2V
2 series · 2 of 2 positions shown · non-contrast
Comparison: None.

CLINICAL DATA: Left chest pain and radiating left arm pain.

EXAM:
CHEST - 2 VIEW

[dg chest 2 view (1 of 2)]
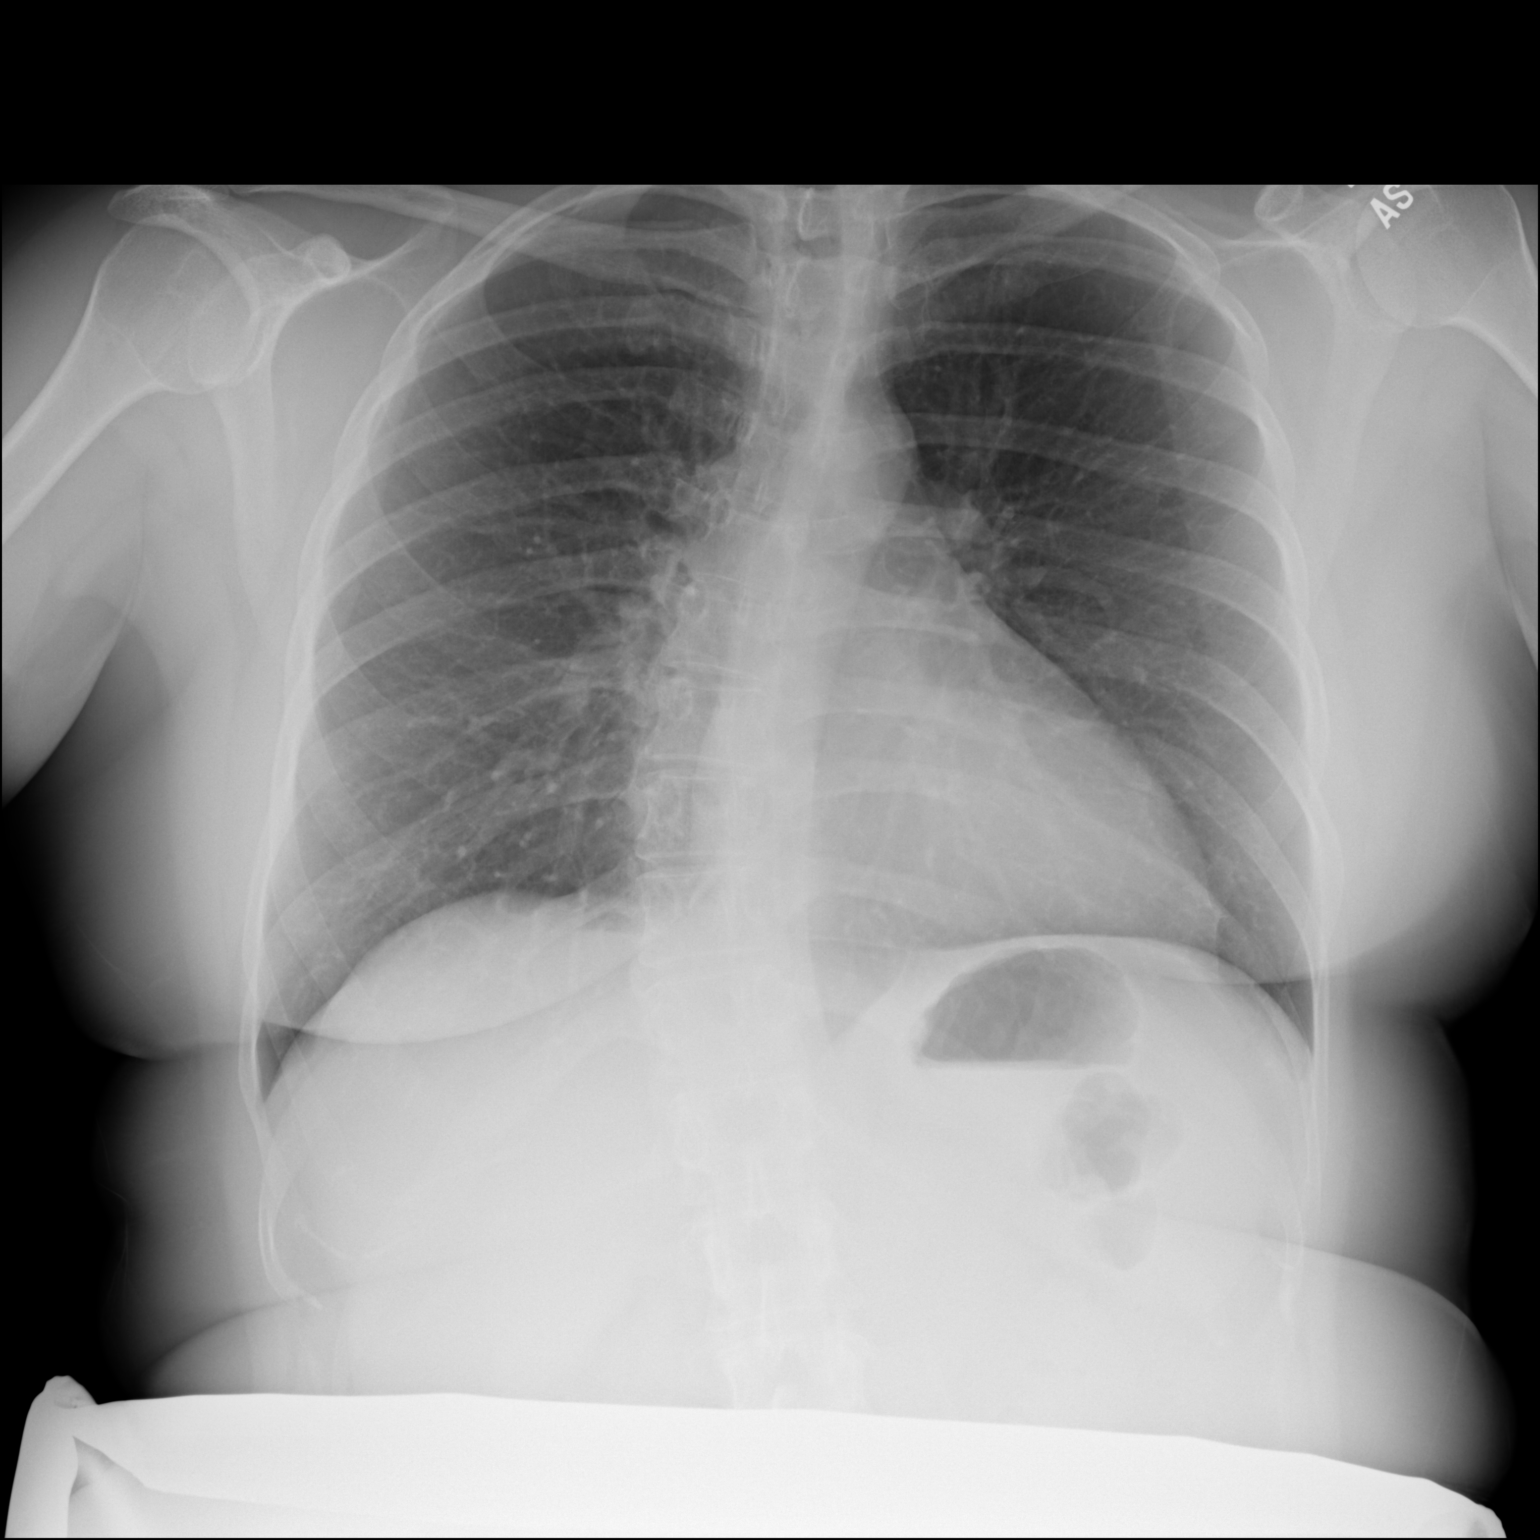

[dg chest 2 view (2 of 2)]
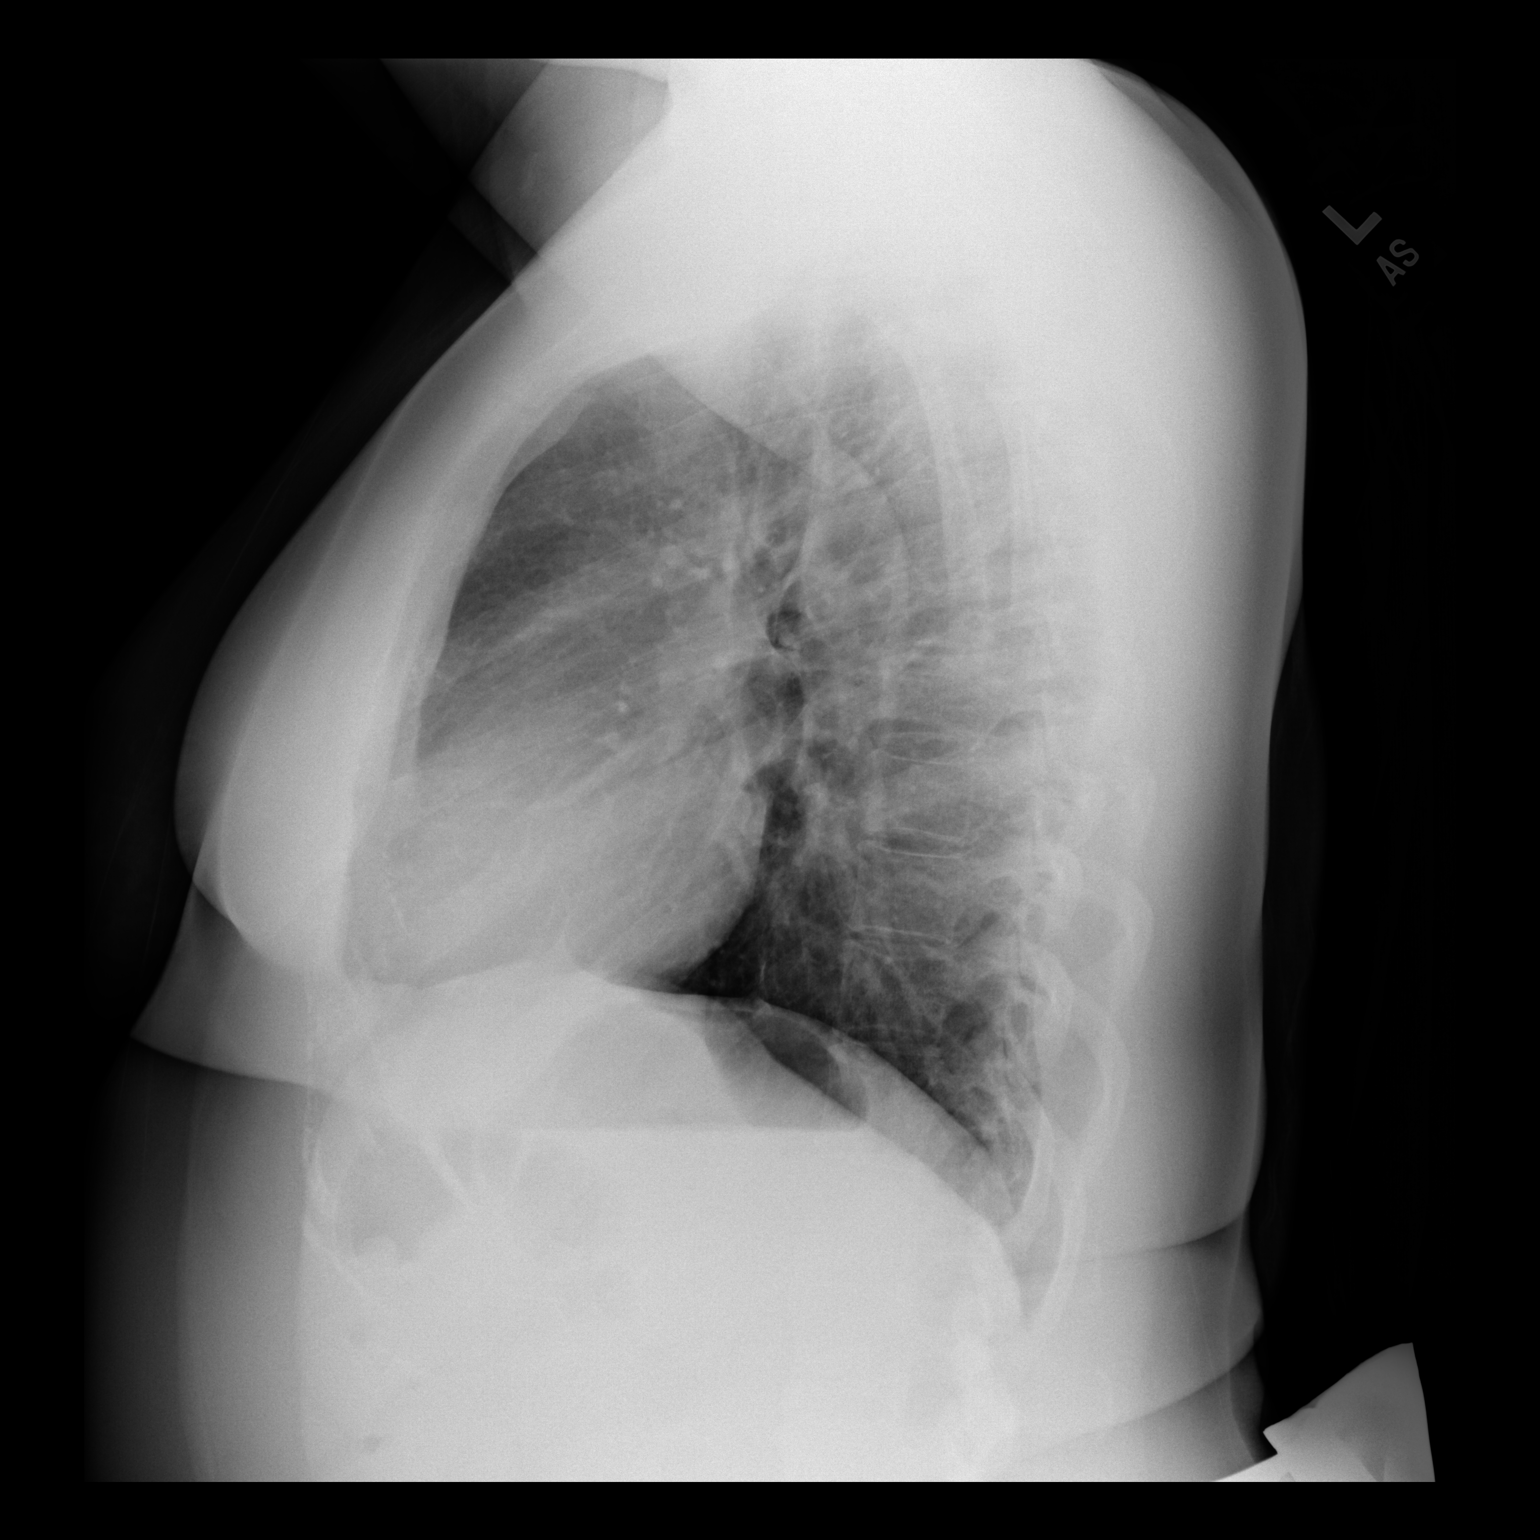

[2 of 2 positions shown; findings below may reference images not displayed]

FINDINGS: The cardiac silhouette, mediastinal and hilar contours are within
normal limits. The lungs are clear. No pleural effusion. No
pulmonary lesions. The bony thorax is intact. S shaped thoracolumbar
scoliosis is noted.
IMPRESSION: No acute cardiopulmonary findings.

## 2018-10-13 MED FILL — RIZATRIPTAN BENZOATE 10 MG: 10 | 30 days supply | Qty: 6 | Fill #0

## 2018-11-07 MED FILL — RIZATRIPTAN BENZOATE 10 MG: 10 | 30 days supply | Qty: 6 | Fill #0 | Status: TO

## 2018-12-23 MED FILL — ARMOUR THYROID 60 MG TABLET: 60 | 90 days supply | Qty: 45 | Fill #0

## 2018-12-23 MED FILL — ARMOUR THYROID 90 MG TABLET: 90 | 90 days supply | Qty: 45 | Fill #0

## 2018-12-23 MED FILL — RIZATRIPTAN BENZOATE 10 MG: 10 | 30 days supply | Qty: 6 | Fill #0

## 2019-02-14 DIAGNOSIS — H5213 Myopia, bilateral: Secondary | ICD-10-CM | POA: Diagnosis not present

## 2019-02-24 MED FILL — RIZATRIPTAN BENZOATE 10 MG: 10 | 30 days supply | Qty: 6 | Fill #0

## 2019-03-05 MED FILL — ARMOUR THYROID 90 MG TABLET: 90 | 90 days supply | Qty: 45 | Fill #0

## 2019-03-05 MED FILL — ARMOUR THYROID 60 MG TABLET: 60 | 90 days supply | Qty: 45 | Fill #0

## 2019-03-27 DIAGNOSIS — R51 Headache: Secondary | ICD-10-CM | POA: Diagnosis not present

## 2019-03-27 DIAGNOSIS — Z20828 Contact with and (suspected) exposure to other viral communicable diseases: Secondary | ICD-10-CM | POA: Diagnosis not present

## 2019-03-27 DIAGNOSIS — R070 Pain in throat: Secondary | ICD-10-CM | POA: Diagnosis not present

## 2019-04-10 DIAGNOSIS — Z0000 Encounter for general adult medical examination without abnormal findings: Secondary | ICD-10-CM | POA: Diagnosis not present

## 2019-05-04 DIAGNOSIS — M5489 Other dorsalgia: Secondary | ICD-10-CM | POA: Diagnosis not present

## 2019-05-04 DIAGNOSIS — Z791 Long term (current) use of non-steroidal anti-inflammatories (NSAID): Secondary | ICD-10-CM | POA: Diagnosis not present

## 2019-05-04 DIAGNOSIS — R52 Pain, unspecified: Secondary | ICD-10-CM | POA: Diagnosis not present

## 2019-05-04 DIAGNOSIS — Z87891 Personal history of nicotine dependence: Secondary | ICD-10-CM | POA: Diagnosis not present

## 2019-05-04 DIAGNOSIS — R109 Unspecified abdominal pain: Secondary | ICD-10-CM | POA: Diagnosis not present

## 2019-05-04 DIAGNOSIS — R1084 Generalized abdominal pain: Secondary | ICD-10-CM | POA: Diagnosis not present

## 2019-05-04 DIAGNOSIS — R112 Nausea with vomiting, unspecified: Secondary | ICD-10-CM | POA: Diagnosis not present

## 2019-05-04 DIAGNOSIS — Z793 Long term (current) use of hormonal contraceptives: Secondary | ICD-10-CM | POA: Diagnosis not present

## 2019-05-04 DIAGNOSIS — Z20828 Contact with and (suspected) exposure to other viral communicable diseases: Secondary | ICD-10-CM | POA: Diagnosis not present

## 2019-05-04 DIAGNOSIS — Z79899 Other long term (current) drug therapy: Secondary | ICD-10-CM | POA: Diagnosis not present

## 2019-05-04 DIAGNOSIS — E063 Autoimmune thyroiditis: Secondary | ICD-10-CM | POA: Diagnosis not present

## 2019-05-04 DIAGNOSIS — R1031 Right lower quadrant pain: Secondary | ICD-10-CM | POA: Diagnosis not present

## 2019-05-04 DIAGNOSIS — R42 Dizziness and giddiness: Secondary | ICD-10-CM | POA: Diagnosis not present

## 2019-05-04 DIAGNOSIS — R51 Headache: Secondary | ICD-10-CM | POA: Diagnosis not present

## 2019-05-05 DIAGNOSIS — Z20828 Contact with and (suspected) exposure to other viral communicable diseases: Secondary | ICD-10-CM | POA: Diagnosis not present

## 2019-05-05 DIAGNOSIS — R1084 Generalized abdominal pain: Secondary | ICD-10-CM | POA: Diagnosis not present

## 2019-05-05 DIAGNOSIS — R112 Nausea with vomiting, unspecified: Secondary | ICD-10-CM | POA: Diagnosis not present

## 2019-05-05 DIAGNOSIS — E063 Autoimmune thyroiditis: Secondary | ICD-10-CM | POA: Diagnosis not present

## 2019-05-05 DIAGNOSIS — Z79899 Other long term (current) drug therapy: Secondary | ICD-10-CM | POA: Diagnosis not present

## 2019-05-05 DIAGNOSIS — R42 Dizziness and giddiness: Secondary | ICD-10-CM | POA: Diagnosis not present

## 2019-05-05 DIAGNOSIS — Z793 Long term (current) use of hormonal contraceptives: Secondary | ICD-10-CM | POA: Diagnosis not present

## 2019-05-05 DIAGNOSIS — R109 Unspecified abdominal pain: Secondary | ICD-10-CM | POA: Diagnosis not present

## 2019-05-05 DIAGNOSIS — Z87891 Personal history of nicotine dependence: Secondary | ICD-10-CM | POA: Diagnosis not present

## 2019-05-05 DIAGNOSIS — Z791 Long term (current) use of non-steroidal anti-inflammatories (NSAID): Secondary | ICD-10-CM | POA: Diagnosis not present

## 2019-05-05 DIAGNOSIS — R1031 Right lower quadrant pain: Secondary | ICD-10-CM | POA: Diagnosis not present

## 2019-05-06 ENCOUNTER — Other Ambulatory Visit: Payer: Self-pay

## 2019-05-06 ENCOUNTER — Encounter: Payer: Self-pay | Admitting: Internal Medicine

## 2019-05-08 ENCOUNTER — Encounter: Payer: Self-pay | Admitting: Internal Medicine

## 2019-05-08 ENCOUNTER — Other Ambulatory Visit: Payer: Self-pay

## 2019-05-08 ENCOUNTER — Ambulatory Visit (INDEPENDENT_AMBULATORY_CARE_PROVIDER_SITE_OTHER): Payer: 59 | Admitting: Internal Medicine

## 2019-05-08 VITALS — BP 114/86 | HR 79 | Ht 63.0 in | Wt 186.0 lb

## 2019-05-08 DIAGNOSIS — E559 Vitamin D deficiency, unspecified: Secondary | ICD-10-CM

## 2019-05-08 DIAGNOSIS — E282 Polycystic ovarian syndrome: Secondary | ICD-10-CM

## 2019-05-08 DIAGNOSIS — E890 Postprocedural hypothyroidism: Secondary | ICD-10-CM

## 2019-05-08 LAB — VITAMIN D 25 HYDROXY (VIT D DEFICIENCY, FRACTURES): VITD: 40.72 ng/mL (ref 30.00–100.00)

## 2019-05-08 LAB — CALCIUM: Calcium: 9.5 mg/dL (ref 8.4–10.5)

## 2019-05-08 LAB — PHOSPHORUS: Phosphorus: 3.2 mg/dL (ref 2.3–4.6)

## 2019-05-08 LAB — MAGNESIUM: Magnesium: 1.7 mg/dL (ref 1.5–2.5)

## 2019-05-08 NOTE — Progress Notes (Addendum)
I Patient ID: Taylor Bennett, female   DOB: [birth year], 38 y.o.   MRN: [id number]    HPI  Taylor Bennett is a 38 y.o.-year-old female, initially referred by her PCP, Redmon, Noelle, PA-C, returning for follow-up for postsurgical hypothyroidism and vitamin D deficiency, low vitamin B12. She previously saw Dr. Dwyane Dee.  Last visit with me 7 months ago.  At last visit, she was describing more fatigue, lethargy, and having dizzy spells.  Her vitamin B12 was slightly low and I advised her to start supplementation.  Several days ago, she sent me the following message: I'm coming in to see you Friday at 11:00 a.m. as a follow up with you from my emergency care visit with [facility] Monday 8/24. I was taken by ambulance there and released 8/25 home with antibiotics and zofran. I am concerned how I am still feeling as I am overall not feeling my normal self. The area where my thyroids were removed has increasingly become more uncomfortable and I have had throbbing sensations there along with pressure and headaches and dizziness.   I linked the my chart accounts and want to know if you can see everything now?  Please let me know if I should come in sooner or what I can do in the meantime to prepare for the visit with you. I really feel something is wrong as if my body is fighting something.  She also attached labs (05/06/2019) to her message and it appears that her calcium was low, at 8.2 (27-10.2).  Glucose was high at 127, TSH was normal at 2.48, lipase normal at 23, and she had a high WBC count (18) and neutrophile count. U/A: UTI. Started on Cefalexin -has been taking this for the last 4 days. UCx pending.  She has a pulsating HA, fatigue, feeling hot (no fever) and overall feeling poorly.  Previous calcium level was normal, however, she did have a slightly low calcium level in 07/2015: Lab Results  Component Value Date   CALCIUM 9.3 09/26/2018   CALCIUM 8.8 (L) 07/19/2015   Reviewed and addended  history: Pt. has a h/o Hashimoto's thyroiditis and now has postsurgical hypothyroidismafter her total thyroidectomy for a suspicious nodule on 12/19/2015 (final pathology >> benign). Surgery was performed by Dr. Celine Ahr. She had compression sxs before the surgery >> now resolved.  She cannot tolerate Synthroid due to significant fatigue.  She is on Armour thyroid 60 alternating with 90 mg every other day (equivalent of 125 mcg levothyroxine per day): - in am - fasting - at least 30 min from b'fast - no Ca, Fe, MVI, PPIs - not on Biotin  Reviewed her TFTs: 05/06/2019: TSH 2.28 Lab Results  Component Value Date   TSH 2.42 09/26/2018   TSH 2.49 07/25/2018   TSH 5.340 (H) 05/02/2018   TSH 2.770 10/10/2017   TSH 1.41 02/14/2017   TSH 3.42 08/09/2016   TSH 0.94 07/28/2015   TSH 0.962 07/19/2015   TSH 0.66 06/07/2014   TSH 1.11 12/23/2013   FREET4 0.70 09/26/2018   FREET4 0.69 07/25/2018   FREET4 0.80 (L) 05/02/2018   FREET4 0.89 10/10/2017   FREET4 0.74 02/14/2017   FREET4 0.52 (L) 08/09/2016   FREET4 0.60 07/28/2015   FREET4 0.77 06/07/2014   FREET4 0.68 12/23/2013   FREET4 0.94 06/12/2013   01/2016: TSH 2.6  Pt denies: - feeling nodules in neck - hoarseness - dysphagia - choking - SOB with lying down  She has + FH of thyroid disorders in: mother.  No FH of thyroid cancer. No h/o radiation tx to head or neck.  No seaweed or kelp. No recent contrast studies. No herbal supplements. No Biotin use. No recent steroids use.   PCOS: -She was on metformin- but stopped 2/2 diarrhea in 07/2017 >> restarted 09/2017 but stopped that she did not like how this made her feel. -She was on NuvaRing (per Dr. Landry Mellow) but now off for almost 2 years -Restarted Ovasitol 09/2017 but did not continue it -Menstrual cycles are regular -No hirsutism or acne but does have hair loss  Vitamin D deficiency:  Latest level was normal - on 5000 units daily. Lab Results  Component Value Date   VD25OH  32.22 09/26/2018   VD25OH 24.33 (L) 07/25/2018   VD25OH 50.7 09/26/2017   VD25OH 49.42 02/14/2017   VD25OH 24.47 (L) 10/23/2016   VD25OH 12.07 (L) 08/09/2016   She is currently on 4,000 units vitamin D daily.  Low vitamin B12:  At last visit, vitamin B12 was on the lower end of normal so we started supplementation with 1000 units vitamin B12 daily: Lab Results  Component Value Date   VITAMINB12 310 09/26/2018   VITAMINB12 550 09/26/2017   She continues on 1000 units vitamin D B12 daily.  She also had a severe vertigo episode in summer 2018 >> on Meclizine.  She also has DDD in cervical spine.  ROS: Constitutional: no weight gain/no weight loss, + fatigue, + subjective hyperthermia, no subjective hypothermia, no dysuria Eyes: no blurry vision, no xerophthalmia ENT: no sore throat, + see HPI Cardiovascular: no CP/no SOB/no palpitations/no leg swelling Respiratory: no cough/no SOB/no wheezing Gastrointestinal: no N/no V/no D/no C/no acid reflux Musculoskeletal: no muscle aches/no joint aches Skin: no rashes, + hair loss Neurological: no tremors/no numbness/no tingling/+ dizziness, + headache  I reviewed pt's medications, allergies, PMH, social hx, family hx, and changes were documented in the history of present illness. Otherwise, unchanged from my initial visit note.  Past Medical History:  Diagnosis Date  . Back pain   . Cervical pain   . PCOS (polycystic ovarian syndrome)   . Scoliosis   . Thyroid disease    Past Surgical History:  Procedure Laterality Date  . THYROIDECTOMY    . WISDOM TOOTH EXTRACTION     Social History   Social History  . Marital status: Single    Spouse name: N/A  . Number of children: N/A   Occupational History  . Not on file.   Social History Main Topics  . Smoking status: Never Smoker  . Smokeless tobacco: Never Used  . Alcohol use No  . Drug use: No   Current Outpatient Medications on File Prior to Visit  Medication Sig Dispense  Refill  . ARMOUR THYROID 60 MG tablet Take 1 tablet (60 mg total) by mouth every other day. 45 tablet 3  . ARMOUR THYROID 90 MG tablet Take 1 tablet (90 mg total) by mouth every other day. 45 tablet 3  . Ascorbic Acid (VITAMIN C PO) Take by mouth daily.    . Cholecalciferol (VITAMIN D PO) Take 5,000 Units by mouth daily.    Marland Kitchen ibuprofen (ADVIL,MOTRIN) 200 MG tablet Take 200 mg by mouth every 6 (six) hours as needed. Takes 600mg  - 800mg  daily, as needed.    . meclizine (ANTIVERT) 25 MG tablet Take 1 tablet (25 mg total) by mouth 3 (three) times daily as needed for dizziness. 20 tablet 0  . metaxalone (SKELAXIN) 800 MG tablet Take 800 mg by  mouth daily as needed for muscle spasms.    . rizatriptan (MAXALT) 10 MG tablet Take 10 mg by mouth as needed for migraine. May repeat in 2 hours if needed     No current facility-administered medications on file prior to visit.    No Known Allergies Family History  Problem Relation Age of Onset  . Thyroid disease Mother   . Diabetes Father   . Congestive Heart Failure Father   . Alcoholism Father   . Breast cancer Maternal Aunt    PE: There were no vitals taken for this visit. Wt Readings from Last 3 Encounters:  09/26/18 194 lb (88 kg)  09/27/17 187 lb (84.8 kg)  08/15/17 187 lb 9.6 oz (85.1 kg)   Constitutional: overweight, in NAD Eyes: PERRLA, EOMI, no exophthalmos ENT: moist mucous membranes, thyroidectomy scar healed, no neck masses palpated, no cervical lymphadenopathy Cardiovascular: RRR, No MRG Respiratory: CTA B Gastrointestinal: abdomen soft, NT, ND, BS+ Musculoskeletal: no deformities, strength intact in all 4 Skin: moist, warm, no rashes Neurological: no tremor with outstretched hands, DTR normal in all 4   ASSESSMENT: 1. Postsurgical Hypothyroidism  2. Vitamin D deficiency  3.  Low vitamin B12  4.  PCOS  5.  Hypocalcemia  PLAN:  1. Patient with longstanding hypothyroidism, now complete after her total thyroidectomy.   She could not tolerate levothyroxine and is now on Armour thyroid. - latest thyroid labs reviewed with pt >> normal 2 days ago - she continues on Armour 90 alternating with 60 mg every other day - pt feels good on this dose. - we discussed about taking the thyroid hormone every day, with water, >30 minutes before breakfast, separated by >4 hours from acid reflux medications, calcium, iron, multivitamins. Pt. is taking it correctly. - will check thyroid tests at next visit  2. Vitamin D deficiency -She has a history of a very low vitamin D level, of 12, for which she started on vitamin D supplements.  She is currently on 4,000 units daily.  -latest level was normal at last visit, but she was on a slightly higher dose than -Continue current dose but we will recheck a level today  3. Low vitamin B12 - discovered at last visit during investigation for fatigue -We started B12 1000 mcg daily -We will recheck her B12 level at next visit  4.  PCOS - off ovasitol and metformin -She does have regular menstrual cycles -At last visit, an HbA1c was normal, at 5.5%.  We will repeat this at next visit. -CMP and lipids were normal with the exception of a slightly high LDL  5.  Hypocalcemia -Possibly after her thyroid surgery, but also possibly due to vitamin D deficiency or excessive hydration (she did receive IV fluids at her last ED visit) - at today's visit, we will check a calcium, vitamin D, magnesium, and PTH level -After the results are back, she may need to start calcium supplements -Calcium target for her is low normal range but acceptable levels are even slightly under the lower limit of normal  She is overall feeling poorly today but I suspect that this is due to her UTI.  She is telling me that there is a suspicion for pyelonephritis, also.  She is finishing up a course of cephalexin and waiting for the results of her urine culture.  Component     Latest Ref Rng & Units 05/08/2019   Calcium     8.4 - 10.5 mg/dL 9.5  VITD  30.00 - 100.00 ng/mL 40.72  Phosphorus     2.3 - 4.6 mg/dL 3.2  Magnesium     1.5 - 2.5 mg/dL 1.7  PTH, Intact     15 - 65 pg/mL 9 (L)   Labs are normal with the exception of a slightly low PTH.  This is most likely postsurgical.  However, the hypoparathyroidism is mild with compensated hypercalcemia.  We will need to keep an eye on her calcium levels, but no intervention is needed for now.  Philemon Kingdom, MD PhD [facility]

## 2019-05-08 NOTE — Patient Instructions (Signed)
Please stop at the lab.  Continue Armour 90 mg with 60 mg every other day.  Take the thyroid hormone every day, with water, at least 30 minutes before breakfast, separated by at least 4 hours from: - acid reflux medications - calcium - iron - multivitamins  Please come back for a follow-up appointment as scheduled.

## 2019-05-09 LAB — PARATHYROID HORMONE, INTACT (NO CA): PTH: 9 pg/mL — ABNORMAL LOW (ref 15–65)

## 2019-05-11 ENCOUNTER — Encounter: Payer: Self-pay | Admitting: Internal Medicine

## 2019-05-11 DIAGNOSIS — D72829 Elevated white blood cell count, unspecified: Secondary | ICD-10-CM | POA: Diagnosis not present

## 2019-05-11 DIAGNOSIS — M549 Dorsalgia, unspecified: Secondary | ICD-10-CM | POA: Diagnosis not present

## 2019-05-11 DIAGNOSIS — R110 Nausea: Secondary | ICD-10-CM | POA: Diagnosis not present

## 2019-05-11 DIAGNOSIS — R109 Unspecified abdominal pain: Secondary | ICD-10-CM | POA: Diagnosis not present

## 2019-05-12 ENCOUNTER — Encounter: Payer: Self-pay | Admitting: Internal Medicine

## 2019-05-14 MED FILL — METAXALONE 800 MG TABLET: 800 | 20 days supply | Qty: 60 | Fill #0

## 2019-06-26 MED FILL — ARMOUR THYROID 60 MG TABLET: 60 | 30 days supply | Qty: 15 | Fill #1

## 2019-06-26 MED FILL — ARMOUR THYROID 90 MG TABLET: 90 | 30 days supply | Qty: 15 | Fill #1

## 2019-07-27 MED FILL — ARMOUR THYROID 90 MG TABLET: 90 | 30 days supply | Qty: 15 | Fill #2

## 2019-07-27 MED FILL — ARMOUR THYROID 60 MG TABLET: 60 | 30 days supply | Qty: 15 | Fill #2

## 2019-08-27 ENCOUNTER — Other Ambulatory Visit: Payer: Self-pay | Admitting: Internal Medicine

## 2019-08-27 MED FILL — RIZATRIPTAN BENZOATE 10 MG: 10 | 30 days supply | Qty: 6 | Fill #1

## 2019-08-27 MED FILL — ARMOUR THYROID 90 MG TABLET: 90 | 30 days supply | Qty: 15 | Fill #3

## 2019-08-28 MED FILL — ARMOUR THYROID 60 MG TABLET: 60 | 30 days supply | Qty: 15 | Fill #0

## 2019-09-16 DIAGNOSIS — L298 Other pruritus: Secondary | ICD-10-CM | POA: Diagnosis not present

## 2019-09-16 DIAGNOSIS — Z20828 Contact with and (suspected) exposure to other viral communicable diseases: Secondary | ICD-10-CM | POA: Diagnosis not present

## 2019-09-16 DIAGNOSIS — Z8261 Family history of arthritis: Secondary | ICD-10-CM | POA: Diagnosis not present

## 2019-09-16 DIAGNOSIS — M2550 Pain in unspecified joint: Secondary | ICD-10-CM | POA: Diagnosis not present

## 2019-09-20 DIAGNOSIS — Z20822 Contact with and (suspected) exposure to covid-19: Secondary | ICD-10-CM | POA: Diagnosis not present

## 2019-09-21 DIAGNOSIS — M2550 Pain in unspecified joint: Secondary | ICD-10-CM | POA: Diagnosis not present

## 2019-09-23 ENCOUNTER — Other Ambulatory Visit: Payer: Self-pay | Admitting: Internal Medicine

## 2019-09-23 MED FILL — ARMOUR THYROID 60 MG TABLET: 60 | 30 days supply | Qty: 15 | Fill #1

## 2019-09-23 MED FILL — ARMOUR THYROID 90 MG TABLET: 90 | 30 days supply | Qty: 15 | Fill #0

## 2019-10-01 ENCOUNTER — Other Ambulatory Visit: Payer: Self-pay

## 2019-10-02 ENCOUNTER — Ambulatory Visit (INDEPENDENT_AMBULATORY_CARE_PROVIDER_SITE_OTHER): Payer: 59 | Admitting: Internal Medicine

## 2019-10-02 ENCOUNTER — Encounter: Payer: Self-pay | Admitting: Internal Medicine

## 2019-10-02 VITALS — BP 120/82 | HR 77 | Ht 63.0 in | Wt 187.0 lb

## 2019-10-02 DIAGNOSIS — E890 Postprocedural hypothyroidism: Secondary | ICD-10-CM

## 2019-10-02 DIAGNOSIS — E538 Deficiency of other specified B group vitamins: Secondary | ICD-10-CM

## 2019-10-02 DIAGNOSIS — E282 Polycystic ovarian syndrome: Secondary | ICD-10-CM | POA: Diagnosis not present

## 2019-10-02 DIAGNOSIS — E559 Vitamin D deficiency, unspecified: Secondary | ICD-10-CM

## 2019-10-02 NOTE — Progress Notes (Signed)
Patient ID: Taylor Bennett, female   DOB: 10/03/1980, 39 y.o.   MRN: [id number]   This visit occurred during the SARS-CoV-2 public health emergency.  Safety protocols were in place, including screening questions prior to the visit, additional usage of staff PPE, and extensive cleaning of exam room while observing appropriate contact time as indicated for disinfecting solutions.   HPI  Taylor Bennett is a 39 y.o.-year-old female, initially referred by her PCP, Redmon, Noelle, PA-C, returning for follow-up for postsurgical hypothyroidism and hypoparathyroidism and vitamin D deficiency, low vitamin B12. She previously saw Dr. Dwyane Dee.  Last visit with me 5 months ago.  At last visit she had headaches, fatigue, feeling hot, and overall feeling poorly.  She did have a UTI then.  The symptoms resolved.  She has generalized joint pains, including in her hands >> she was recently referred to rheumatology.  She has a family history of RA.  Reviewed most recent calcium and PTH levels: 05/29/2019: Calcium 8.2 (8.7-10.2) Lab Results  Component Value Date   PTH 9 (L) 05/08/2019   CALCIUM 9.5 05/08/2019   CALCIUM 9.3 09/26/2018   CALCIUM 8.8 (L) 07/19/2015   Magnesium, vitamin D, and phosphorus levels were normal Component     Latest Ref Rng & Units 05/08/2019  VITD     30.00 - 100.00 ng/mL 40.72  Phosphorus     2.3 - 4.6 mg/dL 3.2  Magnesium     1.5 - 2.5 mg/dL 1.7   Reviewed and addended history: Pt. has a h/o Hashimoto's thyroiditis and now has postsurgical hypothyroidismafter her total thyroidectomy for a suspicious nodule on 12/19/2015 (final pathology >> benign). Surgery was performed by Dr. Celine Ahr. She had compression sxs before the surgery >> now resolved.  She could not tolerate Synthroid due to significant fatigue.  She is on Armour Thyroid 60 alternating with 90 mg every other day (equivalent of 125 mcg levothyroxine per day): - in am - fasting - at least 30 min from b'fast - no Ca, Fe,  PPIs - + MVI with dinner - not on Biotin  Reviewed her TFTs: 05/29/2019: TSH 2.48 05/06/2019: TSH 2.28 Lab Results  Component Value Date   TSH 2.42 09/26/2018   TSH 2.49 07/25/2018   TSH 5.340 (H) 05/02/2018   TSH 2.770 10/10/2017   TSH 1.41 02/14/2017   TSH 3.42 08/09/2016   TSH 0.94 07/28/2015   TSH 0.962 07/19/2015   TSH 0.66 06/07/2014   TSH 1.11 12/23/2013   FREET4 0.70 09/26/2018   FREET4 0.69 07/25/2018   FREET4 0.80 (L) 05/02/2018   FREET4 0.89 10/10/2017   FREET4 0.74 02/14/2017   FREET4 0.52 (L) 08/09/2016   FREET4 0.60 07/28/2015   FREET4 0.77 06/07/2014   FREET4 0.68 12/23/2013   FREET4 0.94 06/12/2013   01/2016: TSH 2.6  Pt denies: - feeling nodules in neck - hoarseness - choking - SOB with lying down + mild dysphagia occasionally after Sx. Also, occasionally hoarseness, cough.  She has + FH of thyroid disorders in: mother. No FH of thyroid cancer. No h/o radiation tx to head or neck.  No herbal supplements. No recent steroids use.   PCOS: -She was on metformin- but stopped 2/2 diarrhea in 07/2017 >> restarted 09/2017 but stopped as she did not like how it made her feel. -She was on NuvaRing (per Dr. Landry Mellow) but now off for more than 2 years. -Restarted Ovasitol 09/2017 but did not continue it -Menstrual cycles are regular -No hirsutism or acne, but does have hair  loss  Vitamin D deficiency:  Latest vitamin D levels were reviewed: Lab Results  Component Value Date   VD25OH 40.72 05/08/2019   VD25OH 32.22 09/26/2018   VD25OH 24.33 (L) 07/25/2018   VD25OH 50.7 09/26/2017   VD25OH 49.42 02/14/2017   VD25OH 24.47 (L) 10/23/2016   VD25OH 12.07 (L) 08/09/2016   She continues on 4000 units vitamin D3 daily.  Low vitamin B12:  In 09/2018 we started supplementation with 1000 mcg of B12 daily. Lab Results  Component Value Date   VITAMINB12 310 09/26/2018   VITAMINB12 550 09/26/2017   She continues on B complex with Biotin 30 mcg - last dose last  night.  She also had a severe vertigo episode in summer 2018 >> on Meclizine.  She also has DDD in cervical spine.  ROS: Constitutional: no weight gain/no weight loss, no fatigue, no subjective hyperthermia, no subjective hypothermia Eyes: no blurry vision, no xerophthalmia ENT: no sore throat, + see HPI Cardiovascular: no CP/no SOB/no palpitations/no leg swelling Respiratory: no cough/no SOB/no wheezing Gastrointestinal: no N/no V/no D/no C/no acid reflux Musculoskeletal: no muscle aches/+ joint aches Skin: no rashes, no hair loss Neurological: no tremors/no numbness/no tingling/no dizziness  I reviewed pt's medications, allergies, PMH, social hx, family hx, and changes were documented in the history of present illness. Otherwise, unchanged from my initial visit note.  Past Medical History:  Diagnosis Date  . Back pain   . Cervical pain   . PCOS (polycystic ovarian syndrome)   . Scoliosis   . Thyroid disease    Past Surgical History:  Procedure Laterality Date  . THYROIDECTOMY    . WISDOM TOOTH EXTRACTION     Social History   Social History  . Marital status: Single    Spouse name: N/A  . Number of children: N/A   Occupational History  . Not on file.   Social History Main Topics  . Smoking status: Never Smoker  . Smokeless tobacco: Never Used  . Alcohol use No  . Drug use: No   Current Outpatient Medications on File Prior to Visit  Medication Sig Dispense Refill  . ARMOUR THYROID 60 MG tablet TAKE 1 TABLET BY MOUTH EVERY OTHER DAY. 45 tablet 1  . ARMOUR THYROID 90 MG tablet TAKE 1 TABLET BY MOUTH EVERY OTHER DAY. 15 tablet 1  . Ascorbic Acid (VITAMIN C PO) Take by mouth daily.    . cephALEXin (KEFLEX) 500 MG capsule Take 500 mg by mouth 4 (four) times daily.    . Cholecalciferol (VITAMIN D PO) Take 5,000 Units by mouth daily.    . folic acid (FOLVITE) A999333 MCG tablet Take 400 mcg by mouth daily.    Marland Kitchen ibuprofen (ADVIL,MOTRIN) 200 MG tablet Take 200 mg by mouth  every 6 (six) hours as needed. Takes 600mg  - 800mg  daily, as needed.    . loratadine (CLARITIN) 10 MG tablet Take 10 mg by mouth daily.    . meclizine (ANTIVERT) 25 MG tablet Take 1 tablet (25 mg total) by mouth 3 (three) times daily as needed for dizziness. 20 tablet 0  . metaxalone (SKELAXIN) 800 MG tablet Take 800 mg by mouth daily as needed for muscle spasms.    . Omega-3 Fatty Acids (FISH OIL) 1000 MG CAPS Take by mouth.    . rizatriptan (MAXALT) 10 MG tablet Take 10 mg by mouth as needed for migraine. May repeat in 2 hours if needed    . ZINC CITRATE-PHYTASE PO Take by mouth.  No current facility-administered medications on file prior to visit.   No Known Allergies Family History  Problem Relation Age of Onset  . Thyroid disease Mother   . Diabetes Father   . Congestive Heart Failure Father   . Alcoholism Father   . Breast cancer Maternal Aunt    PE: BP 120/82   Pulse 77   Ht 5\' 3"  (1.6 m)   Wt 187 lb (84.8 kg)   SpO2 99%   BMI 33.13 kg/m  Wt Readings from Last 3 Encounters:  10/02/19 187 lb (84.8 kg)  05/08/19 186 lb (84.4 kg)  09/26/18 194 lb (88 kg)   Constitutional: overweight, in NAD Eyes: PERRLA, EOMI, no exophthalmos ENT: moist mucous membranes, no neck masses palpated, no cervical lymphadenopathy Cardiovascular: RRR, No MRG Respiratory: CTA B Gastrointestinal: abdomen soft, NT, ND, BS+ Musculoskeletal: no deformities, strength intact in all 4 Skin: moist, warm, no rashes Neurological: no tremor with outstretched hands, DTR normal in all 4  ASSESSMENT: 1. Postsurgical Hypothyroidism  2. Vitamin D deficiency  3.  Low vitamin B12  4.  PCOS  5.  Hypocalcemia  PLAN:  1. Patient with longstanding hypothyroidism, now complete abdomen total thyroidectomy.  She could not tolerate levothyroxine and is now on Armour Thyroid. - latest thyroid labs reviewed with pt >> normal in 05/2019  - she continues on 60 alternating with 90 mg every other day - pt  feels good on this dose. - we discussed about taking the thyroid hormone every day, with water, >30 minutes before breakfast, separated by >4 hours from acid reflux medications, calcium, iron, multivitamins. Pt. is taking it correctly. - will check thyroid tests today: TSH, free T3 and fT4 - If labs are abnormal, she will need to return for repeat TFTs in 1.5 months  2. Vitamin D deficiency -She is currently on 4000 units daily -latest level was normal at last check -We will recheck this today  3. Low vitamin B12 -Discovered during investigation for fatigue -I advised her to start B12 1000 micrograms daily, however, at this visit, she is only on B complex with 30 mcg B12 -We will recheck her B12 today  4.  PCOS -Previously on Ovasitol and Metformin, now off -She has regular menstrual cycles  -Latest HbA1c was excellent, at 5.5% a year ago.  We will repeat this today -CMP and lipids were normal with exception of a slightly high LDL.  5.  Hypocalcemia -Possibly developed after the thyroid surgery but also possibly due to vitamin D deficiency or excessive hydration  -calcium level was normal at last visit, but she had a repeat calcium on 05/29/2019 and this was low, at 8.2 (8.7-10.2). -She denies perioral numbness and acral cramping but has generalized aches and pains -We will recheck this today and she may need to start calcium supplements. -We will target the low normal range  Orders Placed This Encounter  Procedures  . TSH  . T4, free  . T3, free  . PTH, intact and calcium  . VITAMIN D 25 Hydroxy (Vit-D Deficiency, Fractures)  . Vitamin B12  . Hemoglobin A1c    Needs refills Armour.  Component     Latest Ref Rng & Units 10/02/2019  Calcium     8.7 - 10.2 mg/dL 8.9  PTH, Intact     pg/mL CANCELED  Hemoglobin A1C     4.8 - 5.6 % 5.2  Est. average glucose Bld gHb Est-mCnc     mg/dL 103  TSH  0.450 - 4.500 uIU/mL 2.320  T4,Free(Direct)     0.82 - 1.77 ng/dL 0.78 (L)   Triiodothyronine,Free,Serum     2.0 - 4.4 pg/mL 3.5  Vitamin D, 25-Hydroxy     30.0 - 100.0 ng/mL 87.3  Vitamin B12     232 - 1,245 pg/mL 863   The PTH was canceled by the lab but the rest of the labs are normal.  Philemon Kingdom, MD PhD [facility]

## 2019-10-02 NOTE — Patient Instructions (Signed)
Please stop at the lab. ° °Continue Armour 90 mg with 60 mg every other day. ° °Take the thyroid hormone every day, with water, at least 30 minutes before breakfast, separated by at least 4 hours from: °- acid reflux medications °- calcium °- iron °- multivitamins ° °Please come back for a follow-up appointment in 6 months. ° °

## 2019-10-06 LAB — SPECIMEN STATUS REPORT

## 2019-10-07 LAB — VITAMIN D 25 HYDROXY (VIT D DEFICIENCY, FRACTURES): Vit D, 25-Hydroxy: 87.3 ng/mL (ref 30.0–100.0)

## 2019-10-07 LAB — PTH, INTACT AND CALCIUM: Calcium: 8.9 mg/dL (ref 8.7–10.2)

## 2019-10-07 LAB — TSH: TSH: 2.32 u[IU]/mL (ref 0.450–4.500)

## 2019-10-07 LAB — T3, FREE: T3, Free: 3.5 pg/mL (ref 2.0–4.4)

## 2019-10-07 LAB — HEMOGLOBIN A1C
Est. average glucose Bld gHb Est-mCnc: 103 mg/dL
Hgb A1c MFr Bld: 5.2 % (ref 4.8–5.6)

## 2019-10-07 LAB — T4, FREE: Free T4: 0.78 ng/dL — ABNORMAL LOW (ref 0.82–1.77)

## 2019-10-07 LAB — VITAMIN B12: Vitamin B-12: 863 pg/mL (ref 232–1245)

## 2019-10-12 MED ORDER — ARMOUR THYROID 90 MG PO TABS: 90.0000 mg | ORAL_TABLET | ORAL | 3 refills | Status: DC

## 2019-10-12 MED ORDER — ARMOUR THYROID 60 MG PO TABS: 60.0000 mg | ORAL_TABLET | ORAL | 3 refills | Status: DC

## 2019-10-19 MED FILL — ARMOUR THYROID 90 MG TABLET: 90 | 90 days supply | Qty: 45 | Fill #0

## 2019-10-19 MED FILL — ARMOUR THYROID 60 MG TABLET: 60 | 90 days supply | Qty: 45 | Fill #0

## 2019-11-02 DIAGNOSIS — N760 Acute vaginitis: Secondary | ICD-10-CM | POA: Diagnosis not present

## 2019-11-02 DIAGNOSIS — R102 Pelvic and perineal pain: Secondary | ICD-10-CM | POA: Diagnosis not present

## 2019-11-02 MED FILL — FLUCONAZOLE 150 MG TABLET: 150 | 4 days supply | Qty: 2 | Fill #0

## 2019-11-23 NOTE — Progress Notes (Signed)
Office Visit Note  Patient: Taylor Bennett             Date of Birth: September 20, 1980           MRN: [id number]             PCP: Lennie Odor, PA Referring: Lennie Odor, PA Visit Date: 11/30/2019 Occupation: _0 @  Subjective:  Pain in multiple joints.   History of Present Illness: Taylor Bennett is a 39 y.o. female seen in consultation per request of her PCP.  According to patient she was diagnosed with a scoliosis in her childhood.  She had a back brace for 2 years as a teenager.  She never underwent surgery.  She states recently she has been experiencing increased neck and lower back pain.  She has been seen by Dr. Louanne Skye who diagnosed her with degenerative disc disease of cervical and lumbar spine.  She has been prescribed Skelaxin and Motrin by her PCP which she takes on as needed basis for lower back discomfort.  She states she experiences left sided sciatica at times.  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  She states she has recently been experiencing pain and discomfort in her right index and middle finger.  She also has a stiffness in her bilateral hands.  There is family history of rheumatoid arthritis in her paternal grandmother and osteoarthritis on her maternal side.  Her father has fibromyalgia syndrome.  She went to her PCP who did lab work and her sedimentation rate was elevated at 32.  She was referred to me for further evaluation.  She also states that she has occasional discomfort in her left knee joint.  She had left ankle joint injury in 2009 after a fall.  She had tibia fracture and she according to her her ankle joint was shattered.  She had surgery in [place].  She has occasional discomfort in her left ankle joint.  Activities of Daily Living:  Patient reports morning stiffness for several hours.   Patient Reports nocturnal pain.  Difficulty dressing/grooming: Denies Difficulty climbing stairs: Reports Difficulty getting out of chair:  Reports Difficulty using hands for taps, buttons, cutlery, and/or writing: Reports  Review of Systems  Constitutional: Positive for fatigue. Negative for night sweats, weight gain and weight loss.  HENT: Positive for mouth dryness and nose dryness. Negative for mouth sores, trouble swallowing and trouble swallowing.   Eyes: Positive for dryness. Negative for pain, redness and visual disturbance.  Respiratory: Negative for cough, shortness of breath, wheezing and difficulty breathing.   Cardiovascular: Negative for chest pain, palpitations, hypertension, irregular heartbeat and swelling in legs/feet.  Gastrointestinal: Negative for blood in stool, constipation and diarrhea.  Endocrine: Negative for increased urination.  Genitourinary: Negative for difficulty urinating, painful urination and vaginal dryness.  Musculoskeletal: Positive for arthralgias, joint pain, joint swelling, myalgias, morning stiffness, muscle tenderness and myalgias. Negative for muscle weakness.  Skin: Positive for color change and hair loss. Negative for rash, redness, skin tightness, ulcers and sensitivity to sunlight.  Allergic/Immunologic: Negative for susceptible to infections.  Neurological: Positive for dizziness and headaches. Negative for numbness, memory loss, night sweats and weakness.  Hematological: Positive for bruising/bleeding tendency. Negative for swollen glands.  Psychiatric/Behavioral: Positive for sleep disturbance. Negative for depressed mood and confusion. The patient is not nervous/anxious.     PMFS History:  Patient Active Problem List   Diagnosis Date Noted  . Paresthesia 11/30/2016  . Neck pain 10/23/2016  . Left cervical  radiculopathy 10/23/2016  . Vitamin D deficiency 08/10/2016  . PCOS (polycystic ovarian syndrome) 06/16/2013  . Migraines 06/16/2013  . Hypothyroidism 06/10/2013    Past Medical History:  Diagnosis Date  . Back pain   . Cervical pain   . PCOS (polycystic ovarian  syndrome)   . Scoliosis   . Thyroid disease     Family History  Problem Relation Age of Onset  . Thyroid disease Mother   . Arthritis Mother   . Diabetes Father   . Congestive Heart Failure Father   . Alcoholism Father   . Neuropathy Father   . Arthritis Father   . Osteoarthritis Maternal Aunt   . Rheum arthritis Paternal Grandmother   . Healthy Daughter   . Polycystic ovary syndrome Daughter    Past Surgical History:  Procedure Laterality Date  . ANKLE SURGERY  2009   ORIF   . THYROIDECTOMY    . WISDOM TOOTH EXTRACTION     Social History   Social History Narrative   Lives at home with her fiance and children.   Right-handed.   1-2 cup caffeine daily.   Immunization History  Administered Date(s) Administered  . Influenza-Unspecified 06/03/2014, 06/20/2015     Objective: Vital Signs: BP 112/77 (BP Location: Right Arm, Patient Position: Sitting, Cuff Size: Normal)   Pulse 71   Resp 14   Ht '5\' 3"'$  (1.6 m)   Wt 188 lb 12.8 oz (85.6 kg)   BMI 33.44 kg/m    Physical Exam Vitals and nursing note reviewed.  Constitutional:      Appearance: She is well-developed.  HENT:     Head: Normocephalic and atraumatic.  Eyes:     Conjunctiva/sclera: Conjunctivae normal.  Cardiovascular:     Rate and Rhythm: Normal rate and regular rhythm.     Heart sounds: Normal heart sounds.  Pulmonary:     Effort: Pulmonary effort is normal.     Breath sounds: Normal breath sounds.  Abdominal:     General: Bowel sounds are normal.     Palpations: Abdomen is soft.  Musculoskeletal:     Cervical back: Normal range of motion.  Lymphadenopathy:     Cervical: No cervical adenopathy.  Skin:    General: Skin is warm and dry.     Capillary Refill: Capillary refill takes less than 2 seconds.  Neurological:     Mental Status: She is alert and oriented to person, place, and time.  Psychiatric:        Behavior: Behavior normal.      Musculoskeletal Exam: C-spine was in good range of  motion.  She has thoracolumbar S-shaped scoliosis.  She has tenderness over SI joints.  Shoulder joints, elbow joints, wrist joints, MCPs with good range of motion.  She has thickening of her right first and second DIP joint and left fourth DIP.  No synovitis was noted.  Hip joints were in good range of motion.  She has tenderness over bilateral trochanteric bursa.  She has discomfort with range of motion of her left knee joint without any warmth swelling or effusion.  She had left ankle surgery in the past which causes discomfort.  She has some tenderness across her MTPs but no synovitis was noted.  CDAI Exam: CDAI Score: -- Patient Global: --; Provider Global: -- Swollen: --; Tender: -- Joint Exam 11/30/2019   No joint exam has been documented for this visit   There is currently no information documented on the homunculus. Go to the Rheumatology activity  and complete the homunculus joint exam.  Investigation: No additional findings.  Imaging: XR Foot 2 Views Left  Result Date: 11/30/2019 Mild PIP and DIP narrowing was noted.  No MTP, intertarsal or tibiotalar joint space narrowing was noted.  A screw was noted in the tibia. Impression: These findings are consistent with mild osteoarthritis and postsurgical changes.  XR Foot 2 Views Right  Result Date: 11/30/2019 Mild PIP and DIP narrowing was noted.  No MTP, intertarsal or tibiotalar joint space narrowing was noted.  No erosive changes were noted. Impression: These findings are consistent with mild early osteoarthritis of the foot.  XR Hand 2 View Left  Result Date: 11/30/2019 [facility] narrowing and spurring was noted.  Mild fourth and fifth digit DIP narrowing was noted.  No MCP narrowing was noted.  No intercarpal radiocarpal joint space narrowing was noted.  No erosive changes were noted. Impression: These findings are consistent with mild early osteoarthritis of the hand.  XR Hand 2 View Right  Result Date: 11/30/2019 Mild DIP  narrowing was noted.  No MCP, intercarpal radiocarpal joint space narrowing was noted.  No erosive changes were noted. Impression: These findings are consistent with mild osteoarthritis of the hand.  XR KNEE 3 VIEW LEFT  Result Date: 11/30/2019 No medial lateral compartment narrowing was noted.  Mild patellofemoral narrowing was noted.  No chondrocalcinosis was noted. Impression: These findings are consistent with mild chondromalacia patella of the knee joint.  XR Pelvis 1-2 Views  Result Date: 11/30/2019 No SI joint to sclerosis or narrowing was noted.  No hip joint narrowing was noted. Impression: Unremarkable x-ray of the SI joints and hip joints.   Recent Labs: Lab Results  Component Value Date   WBC 9.4 07/19/2015   HGB 13.4 07/19/2015   PLT 303 07/19/2015   NA 138 09/26/2018   K 4.1 09/26/2018   CL 104 09/26/2018   CO2 26 09/26/2018   GLUCOSE 80 09/26/2018   BUN 14 09/26/2018   CREATININE 0.68 09/26/2018   BILITOT 0.3 09/26/2018   ALKPHOS 58 07/19/2015   AST 14 09/26/2018   ALT 12 09/26/2018   PROT 6.7 09/26/2018   ALBUMIN 3.6 07/19/2015   CALCIUM 8.9 10/02/2019   GFRAA 128 09/26/2018    Speciality Comments: No specialty comments available.  Procedures:  No procedures performed Allergies: Patient has no known allergies.   Assessment / Plan:     Visit Diagnoses: Polyarthralgia - 09/21/19: ANA-, ESR 32, RF<10, CBC WNL, uric acid 5.3.  Patient had no synovitis on my examination today.  She has some clinical and radiographic findings consistent with osteoarthritis.  I believe she has a component of myofascial pain.  There is history of fibromyalgia in her father.  She has positive tender points and hyperalgesia.  I offered referral to integrative therapies.  I also emphasized swimming on a regular basis.  Good sleep hygiene was discussed.  Pain in both hands -she has been experiencing pain and discomfort in her bilateral hands.  Mostly the discomfort has been in her DIP  joints.  Plan: XR Hand 2 View Right, XR Hand 2 View Left, she has mild DIP narrowing but no MCP or intercarpal narrowing was noted.  These findings are consistent with osteoarthritis.  There is family history of rheumatoid arthritis in paternal grandmother.  I will obtain following labs today.  Her sed rate was mildly elevated.  Cyclic citrul peptide antibody, IgG, 14-3-3 eta Protein, Sedimentation rate  Chronic pain of left knee -she complains of  left knee joint discomfort.  The x-ray findings showed mild chondromalacia patella.  Plan: XR KNEE 3 VIEW LEFT  Pain in both feet -she has postsurgical change in her ankle with the screw.  Her clinical findings and radiographic findings are consistent with mild osteoarthritis.  Plan: XR Foot 2 Views Right, XR Foot 2 Views Left  Chronic SI joint pain -she complains of SI joint discomfort.  SI joints were normal and hip joints were normal.  Plan: XR Pelvis 1-2 Views, HLA-B27 antigen  Myofascial pain-she has positive tender points, trapezius spasm, trochanteric bursitis and hyperalgesia.  I discussed referral to integrative therapies.  Patient declined physical therapy at this time.  Trochanteric bursitis, bilateral-she would benefit from IT band exercises.  Handout was given.  She will also benefit from physical therapy.  DDD (degenerative disc disease), cervical-followed by Dr. Louanne Skye.  Thoracic scoliosis-she has significant thoracolumbar scoliosis.  She would benefit from serving.  Left cervical radiculopathy  DDD (degenerative disc disease), lumbar-followed by Dr. Louanne Skye.  Family history of rheumatoid arthritis-paternal grandmother.  Postoperative hypothyroidism  Vitamin D deficiency  PCOS (polycystic ovarian syndrome)  Hx of migraines  Orders: Orders Placed This Encounter  Procedures  . XR Pelvis 1-2 Views  . XR Hand 2 View Right  . XR Hand 2 View Left  . XR KNEE 3 VIEW LEFT  . XR Foot 2 Views Right  . XR Foot 2 Views Left  . Cyclic  citrul peptide antibody, IgG  . 14-3-3 eta Protein  . Sedimentation rate  . HLA-B27 antigen   No orders of the defined types were placed in this encounter.   Face-to-face time spent with patient was 50 minutes. Greater than 50% of time was spent in counseling and coordination of care.  Follow-Up Instructions: Return for Polyarthralgia, DDD, scoliosis.   Bo Merino, MD  Note - This record has been created using Editor, commissioning.  Chart creation errors have been sought, but may not always  have been located. Such creation errors do not reflect on  the standard of medical care.

## 2019-11-30 ENCOUNTER — Ambulatory Visit: Payer: Self-pay

## 2019-11-30 ENCOUNTER — Ambulatory Visit: Payer: 59 | Admitting: Rheumatology

## 2019-11-30 ENCOUNTER — Encounter: Payer: Self-pay | Admitting: Rheumatology

## 2019-11-30 ENCOUNTER — Other Ambulatory Visit: Payer: Self-pay

## 2019-11-30 VITALS — BP 112/77 | HR 71 | Resp 14 | Ht 63.0 in | Wt 188.8 lb

## 2019-11-30 DIAGNOSIS — G8929 Other chronic pain: Secondary | ICD-10-CM | POA: Diagnosis not present

## 2019-11-30 DIAGNOSIS — M7062 Trochanteric bursitis, left hip: Secondary | ICD-10-CM

## 2019-11-30 DIAGNOSIS — M2550 Pain in unspecified joint: Secondary | ICD-10-CM

## 2019-11-30 DIAGNOSIS — E559 Vitamin D deficiency, unspecified: Secondary | ICD-10-CM

## 2019-11-30 DIAGNOSIS — M79642 Pain in left hand: Secondary | ICD-10-CM | POA: Diagnosis not present

## 2019-11-30 DIAGNOSIS — Z8669 Personal history of other diseases of the nervous system and sense organs: Secondary | ICD-10-CM

## 2019-11-30 DIAGNOSIS — M5412 Radiculopathy, cervical region: Secondary | ICD-10-CM | POA: Diagnosis not present

## 2019-11-30 DIAGNOSIS — M79672 Pain in left foot: Secondary | ICD-10-CM

## 2019-11-30 DIAGNOSIS — M7918 Myalgia, other site: Secondary | ICD-10-CM

## 2019-11-30 DIAGNOSIS — M79671 Pain in right foot: Secondary | ICD-10-CM

## 2019-11-30 DIAGNOSIS — Z8261 Family history of arthritis: Secondary | ICD-10-CM

## 2019-11-30 DIAGNOSIS — M4124 Other idiopathic scoliosis, thoracic region: Secondary | ICD-10-CM

## 2019-11-30 DIAGNOSIS — M5030 Other cervical disc degeneration, unspecified cervical region: Secondary | ICD-10-CM | POA: Diagnosis not present

## 2019-11-30 DIAGNOSIS — E282 Polycystic ovarian syndrome: Secondary | ICD-10-CM

## 2019-11-30 DIAGNOSIS — M25562 Pain in left knee: Secondary | ICD-10-CM | POA: Diagnosis not present

## 2019-11-30 DIAGNOSIS — E890 Postprocedural hypothyroidism: Secondary | ICD-10-CM

## 2019-11-30 DIAGNOSIS — M79641 Pain in right hand: Secondary | ICD-10-CM

## 2019-11-30 DIAGNOSIS — M5136 Other intervertebral disc degeneration, lumbar region: Secondary | ICD-10-CM

## 2019-11-30 DIAGNOSIS — M533 Sacrococcygeal disorders, not elsewhere classified: Secondary | ICD-10-CM

## 2019-11-30 NOTE — Patient Instructions (Signed)
Iliotibial Band Syndrome Rehab Ask your health care provider which exercises are safe for you. Do exercises exactly as told by your health care provider and adjust them as directed. It is normal to feel mild stretching, pulling, tightness, or discomfort as you do these exercises. Stop right away if you feel sudden pain or your pain gets significantly worse. Do not begin these exercises until told by your health care provider. Stretching and range-of-motion exercises These exercises warm up your muscles and joints and improve the movement and flexibility of your hip and pelvis. Quadriceps stretch, prone  1. Lie on your abdomen on a firm surface, such as a bed or padded floor (prone position). 2. Bend your left / right knee and reach back to hold your ankle or pant leg. If you cannot reach your ankle or pant leg, loop a belt around your foot and grab the belt instead. 3. Gently pull your heel toward your buttocks. Your knee should not slide out to the side. You should feel a stretch in the front of your thigh and knee (quadriceps). 4. Hold this position for __________ seconds. Repeat __________ times. Complete this exercise __________ times a day. Iliotibial band stretch An iliotibial band is a strong band of muscle tissue that runs from the outer side of your hip to the outer side of your thigh and knee. 1. Lie on your side with your left / right leg in the top position. 2. Bend both of your knees and grab your left / right ankle. Stretch out your bottom arm to help you balance. 3. Slowly bring your top knee back so your thigh goes behind your trunk. 4. Slowly lower your top leg toward the floor until you feel a gentle stretch on the outside of your left / right hip and thigh. If you do not feel a stretch and your knee will not fall farther, place the heel of your other foot on top of your knee and pull your knee down toward the floor with your foot. 5. Hold this position for __________  seconds. Repeat __________ times. Complete this exercise __________ times a day. Strengthening exercises These exercises build strength and endurance in your hip and pelvis. Endurance is the ability to use your muscles for a long time, even after they get tired. Straight leg raises, side-lying This exercise strengthens the muscles that rotate the leg at the hip and move it away from your body (hip abductors). 1. Lie on your side with your left / right leg in the top position. Lie so your head, shoulder, hip, and knee line up. You may bend your bottom knee to help you balance. 2. Roll your hips slightly forward so your hips are stacked directly over each other and your left / right knee is facing forward. 3. Tense the muscles in your outer thigh and lift your top leg 4-6 inches (10-15 cm). 4. Hold this position for __________ seconds. 5. Slowly return to the starting position. Let your muscles relax completely before doing another repetition. Repeat __________ times. Complete this exercise __________ times a day. Leg raises, prone This exercise strengthens the muscles that move the hips (hip extensors). 1. Lie on your abdomen on your bed or a firm surface. You can put a pillow under your hips if that is more comfortable for your lower back. 2. Bend your left / right knee so your foot is straight up in the air. 3. Squeeze your buttocks muscles and lift your left / right thigh   off the bed. Do not let your back arch. °4. Tense your thigh muscle as hard as you can without increasing any knee pain. °5. Hold this position for __________ seconds. °6. Slowly lower your leg to the starting position and allow it to relax completely. °Repeat __________ times. Complete this exercise __________ times a day. °Hip hike °1. Stand sideways on a bottom step. Stand on your left / right leg with your other foot unsupported next to the step. You can hold on to the railing or wall for balance if needed. °2. Keep your knees  straight and your torso square. Then lift your left / right hip up toward the ceiling. °3. Slowly let your left / right hip lower toward the floor, past the starting position. Your foot should get closer to the floor. Do not lean or bend your knees. °Repeat __________ times. Complete this exercise __________ times a day. °This information is not intended to replace advice given to you by your health care provider. Make sure you discuss any questions you have with your health care provider. °Document Revised: 12/18/2018 Document Reviewed: 06/18/2018 °[employer] Patient Education © 2020 [employer] °Hand Exercises °Hand exercises can be helpful for almost anyone. These exercises can strengthen the hands, improve flexibility and movement, and increase blood flow to the hands. These results can make work and daily tasks easier. Hand exercises can be especially helpful for people who have joint pain from arthritis or have nerve damage from overuse (carpal tunnel syndrome). These exercises can also help people who have injured a hand. °Exercises °Most of these hand exercises are gentle stretching and motion exercises. It is usually safe to do them often throughout the day. Warming up your hands before exercise may help to reduce stiffness. You can do this with gentle massage or by placing your hands in warm water for 10-15 minutes. °It is normal to feel some stretching, pulling, tightness, or mild discomfort as you begin new exercises. This will gradually improve. Stop an exercise right away if you feel sudden, severe pain or your pain gets worse. Ask your health care provider which exercises are best for you. °Knuckle bend or "claw" fist °1. Stand or sit with your arm, hand, and all five fingers pointed straight up. Make sure to keep your wrist straight during the exercise. °2. Gently bend your fingers down toward your palm until the tips of your fingers are touching the top of your palm. Keep your big knuckle straight  and just bend the small knuckles in your fingers. °3. Hold this position for __________ seconds. °4. Straighten (extend) your fingers back to the starting position. °Repeat this exercise 5-10 times with each hand. °Full finger fist °1. Stand or sit with your arm, hand, and all five fingers pointed straight up. Make sure to keep your wrist straight during the exercise. °2. Gently bend your fingers into your palm until the tips of your fingers are touching the middle of your palm. °3. Hold this position for __________ seconds. °4. Extend your fingers back to the starting position, stretching every joint fully. °Repeat this exercise 5-10 times with each hand. °Straight fist °1. Stand or sit with your arm, hand, and all five fingers pointed straight up. Make sure to keep your wrist straight during the exercise. °2. Gently bend your fingers at the big knuckle, where your fingers meet your hand, and the middle knuckle. Keep the knuckle at the tips of your fingers straight and try to touch the bottom of   your palm. °3. Hold this position for __________ seconds. °4. Extend your fingers back to the starting position, stretching every joint fully. °Repeat this exercise 5-10 times with each hand. °Tabletop °1. Stand or sit with your arm, hand, and all five fingers pointed straight up. Make sure to keep your wrist straight during the exercise. °2. Gently bend your fingers at the big knuckle, where your fingers meet your hand, as far down as you can while keeping the small knuckles in your fingers straight. Think of forming a tabletop with your fingers. °3. Hold this position for __________ seconds. °4. Extend your fingers back to the starting position, stretching every joint fully. °Repeat this exercise 5-10 times with each hand. °Finger spread °1. Place your hand flat on a table with your palm facing down. Make sure your wrist stays straight as you do this exercise. °2. Spread your fingers and thumb apart from each other as far  as you can until you feel a gentle stretch. Hold this position for __________ seconds. °3. Bring your fingers and thumb tight together again. Hold this position for __________ seconds. °Repeat this exercise 5-10 times with each hand. °Making circles °1. Stand or sit with your arm, hand, and all five fingers pointed straight up. Make sure to keep your wrist straight during the exercise. °2. Make a circle by touching the tip of your thumb to the tip of your index finger. °3. Hold for __________ seconds. Then open your hand wide. °4. Repeat this motion with your thumb and each finger on your hand. °Repeat this exercise 5-10 times with each hand. °Thumb motion °1. Sit with your forearm resting on a table and your wrist straight. Your thumb should be facing up toward the ceiling. Keep your fingers relaxed as you move your thumb. °2. Lift your thumb up as high as you can toward the ceiling. Hold for __________ seconds. °3. Bend your thumb across your palm as far as you can, reaching the tip of your thumb for the small finger (pinkie) side of your palm. Hold for __________ seconds. °Repeat this exercise 5-10 times with each hand. °Grip strengthening ° °1. Hold a stress ball or other soft ball in the middle of your hand. °2. Slowly increase the pressure, squeezing the ball as much as you can without causing pain. Think of bringing the tips of your fingers into the middle of your palm. All of your finger joints should bend when doing this exercise. °3. Hold your squeeze for __________ seconds, then relax. °Repeat this exercise 5-10 times with each hand. °Contact a health care provider if: °· Your hand pain or discomfort gets much worse when you do an exercise. °· Your hand pain or discomfort does not improve within 2 hours after you exercise. °If you have any of these problems, stop doing these exercises right away. Do not do them again unless your health care provider says that you can. °Get help right away if: °· You  develop sudden, severe hand pain or swelling. If this happens, stop doing these exercises right away. Do not do them again unless your health care provider says that you can. °This information is not intended to replace advice given to you by your health care provider. Make sure you discuss any questions you have with your health care provider. °Document Revised: 12/18/2018 Document Reviewed: 08/28/2018 °[employer] Patient Education © 2020 [employer] ° °

## 2019-12-05 LAB — HLA-B27 ANTIGEN: HLA-B27 Antigen: NEGATIVE

## 2019-12-05 LAB — 14-3-3 ETA PROTEIN: 14-3-3 eta Protein: 3.6 ng/mL — ABNORMAL HIGH (ref <0.2)

## 2019-12-05 LAB — CYCLIC CITRUL PEPTIDE ANTIBODY, IGG: Cyclic Citrullin Peptide Ab: <16 U

## 2019-12-05 LAB — SEDIMENTATION RATE: Sed Rate: 34 mm/h — ABNORMAL HIGH (ref 0–20)

## 2019-12-06 NOTE — Progress Notes (Signed)
Will discuss labs at the fu visit. Please schedule earlier appt if there is a cancellation.

## 2019-12-09 ENCOUNTER — Encounter: Payer: Self-pay | Admitting: Rheumatology

## 2019-12-09 ENCOUNTER — Ambulatory Visit (INDEPENDENT_AMBULATORY_CARE_PROVIDER_SITE_OTHER): Payer: 59 | Admitting: Rheumatology

## 2019-12-09 ENCOUNTER — Other Ambulatory Visit: Payer: Self-pay

## 2019-12-09 VITALS — BP 116/73 | HR 76 | Resp 15 | Ht 63.0 in | Wt 189.4 lb

## 2019-12-09 DIAGNOSIS — M533 Sacrococcygeal disorders, not elsewhere classified: Secondary | ICD-10-CM | POA: Diagnosis not present

## 2019-12-09 DIAGNOSIS — E559 Vitamin D deficiency, unspecified: Secondary | ICD-10-CM

## 2019-12-09 DIAGNOSIS — M19071 Primary osteoarthritis, right ankle and foot: Secondary | ICD-10-CM

## 2019-12-09 DIAGNOSIS — M2550 Pain in unspecified joint: Secondary | ICD-10-CM

## 2019-12-09 DIAGNOSIS — M7918 Myalgia, other site: Secondary | ICD-10-CM

## 2019-12-09 DIAGNOSIS — M7062 Trochanteric bursitis, left hip: Secondary | ICD-10-CM

## 2019-12-09 DIAGNOSIS — E890 Postprocedural hypothyroidism: Secondary | ICD-10-CM

## 2019-12-09 DIAGNOSIS — M2242 Chondromalacia patellae, left knee: Secondary | ICD-10-CM

## 2019-12-09 DIAGNOSIS — M19042 Primary osteoarthritis, left hand: Secondary | ICD-10-CM

## 2019-12-09 DIAGNOSIS — M19041 Primary osteoarthritis, right hand: Secondary | ICD-10-CM | POA: Diagnosis not present

## 2019-12-09 DIAGNOSIS — M4124 Other idiopathic scoliosis, thoracic region: Secondary | ICD-10-CM | POA: Diagnosis not present

## 2019-12-09 DIAGNOSIS — M19072 Primary osteoarthritis, left ankle and foot: Secondary | ICD-10-CM

## 2019-12-09 DIAGNOSIS — M51369 Other intervertebral disc degeneration, lumbar region without mention of lumbar back pain or lower extremity pain: Secondary | ICD-10-CM

## 2019-12-09 DIAGNOSIS — E282 Polycystic ovarian syndrome: Secondary | ICD-10-CM

## 2019-12-09 DIAGNOSIS — Z8261 Family history of arthritis: Secondary | ICD-10-CM

## 2019-12-09 DIAGNOSIS — M5030 Other cervical disc degeneration, unspecified cervical region: Secondary | ICD-10-CM | POA: Diagnosis not present

## 2019-12-09 DIAGNOSIS — G8929 Other chronic pain: Secondary | ICD-10-CM

## 2019-12-09 DIAGNOSIS — Z8669 Personal history of other diseases of the nervous system and sense organs: Secondary | ICD-10-CM

## 2019-12-09 DIAGNOSIS — M5136 Other intervertebral disc degeneration, lumbar region: Secondary | ICD-10-CM | POA: Diagnosis not present

## 2019-12-09 NOTE — Patient Instructions (Signed)
Iliotibial Band Syndrome Rehab Ask your health care provider which exercises are safe for you. Do exercises exactly as told by your health care provider and adjust them as directed. It is normal to feel mild stretching, pulling, tightness, or discomfort as you do these exercises. Stop right away if you feel sudden pain or your pain gets significantly worse. Do not begin these exercises until told by your health care provider. Stretching and range-of-motion exercises These exercises warm up your muscles and joints and improve the movement and flexibility of your hip and pelvis. Quadriceps stretch, prone  1. Lie on your abdomen on a firm surface, such as a bed or padded floor (prone position). 2. Bend your left / right knee and reach back to hold your ankle or pant leg. If you cannot reach your ankle or pant leg, loop a belt around your foot and grab the belt instead. 3. Gently pull your heel toward your buttocks. Your knee should not slide out to the side. You should feel a stretch in the front of your thigh and knee (quadriceps). 4. Hold this position for __________ seconds. Repeat __________ times. Complete this exercise __________ times a day. Iliotibial band stretch An iliotibial band is a strong band of muscle tissue that runs from the outer side of your hip to the outer side of your thigh and knee. 1. Lie on your side with your left / right leg in the top position. 2. Bend both of your knees and grab your left / right ankle. Stretch out your bottom arm to help you balance. 3. Slowly bring your top knee back so your thigh goes behind your trunk. 4. Slowly lower your top leg toward the floor until you feel a gentle stretch on the outside of your left / right hip and thigh. If you do not feel a stretch and your knee will not fall farther, place the heel of your other foot on top of your knee and pull your knee down toward the floor with your foot. 5. Hold this position for __________  seconds. Repeat __________ times. Complete this exercise __________ times a day. Strengthening exercises These exercises build strength and endurance in your hip and pelvis. Endurance is the ability to use your muscles for a long time, even after they get tired. Straight leg raises, side-lying This exercise strengthens the muscles that rotate the leg at the hip and move it away from your body (hip abductors). 1. Lie on your side with your left / right leg in the top position. Lie so your head, shoulder, hip, and knee line up. You may bend your bottom knee to help you balance. 2. Roll your hips slightly forward so your hips are stacked directly over each other and your left / right knee is facing forward. 3. Tense the muscles in your outer thigh and lift your top leg 4-6 inches (10-15 cm). 4. Hold this position for __________ seconds. 5. Slowly return to the starting position. Let your muscles relax completely before doing another repetition. Repeat __________ times. Complete this exercise __________ times a day. Leg raises, prone This exercise strengthens the muscles that move the hips (hip extensors). 1. Lie on your abdomen on your bed or a firm surface. You can put a pillow under your hips if that is more comfortable for your lower back. 2. Bend your left / right knee so your foot is straight up in the air. 3. Squeeze your buttocks muscles and lift your left / right thigh   off the bed. Do not let your back arch. 4. Tense your thigh muscle as hard as you can without increasing any knee pain. 5. Hold this position for __________ seconds. 6. Slowly lower your leg to the starting position and allow it to relax completely. Repeat __________ times. Complete this exercise __________ times a day. Hip hike 1. Stand sideways on a bottom step. Stand on your left / right leg with your other foot unsupported next to the step. You can hold on to the railing or wall for balance if needed. 2. Keep your knees  straight and your torso square. Then lift your left / right hip up toward the ceiling. 3. Slowly let your left / right hip lower toward the floor, past the starting position. Your foot should get closer to the floor. Do not lean or bend your knees. Repeat __________ times. Complete this exercise __________ times a day. This information is not intended to replace advice given to you by your health care provider. Make sure you discuss any questions you have with your health care provider. Document Revised: 12/18/2018 Document Reviewed: 06/18/2018 [employer] Patient Education  2020 [employer]  

## 2019-12-09 NOTE — Progress Notes (Signed)
Office Visit Note  Patient: Taylor Bennett             Date of Birth: 1981-06-02           MRN: [id number]             PCP: Lennie Odor, PA Referring: Lennie Odor, PA Visit Date: 12/09/2019 Occupation: '@GUAROCC'$ @  Subjective:  Pain in multiple joints.   History of Present Illness: Taylor Bennett is a 39 y.o. female with history of pain in multiple joints.  She states she continues to have pain and discomfort in her bilateral hands.  Which she describes over her wrist joints more so on her right hand as she is right-handed.  She also has some discomfort in her DIP joints.  She continues to have pain and discomfort in her bilateral elbows and her bilateral shoulders.  She has pain in her feet which she describes over the lateral aspect of her foot.  She has chronic lower back pain.  Activities of Daily Living:  Patient reports morning stiffness for 30-60 minutes.   Patient Reports nocturnal pain.  Difficulty dressing/grooming: Reports Difficulty climbing stairs: Reports Difficulty getting out of chair: Denies Difficulty using hands for taps, buttons, cutlery, and/or writing: Reports  Review of Systems  Constitutional: Positive for fatigue. Negative for night sweats, weight gain and weight loss.  HENT: Positive for mouth dryness. Negative for mouth sores, trouble swallowing, trouble swallowing and nose dryness.   Eyes: Positive for dryness. Negative for pain, redness, itching and visual disturbance.  Respiratory: Negative for cough, shortness of breath and difficulty breathing.   Cardiovascular: Negative for chest pain, palpitations, hypertension, irregular heartbeat and swelling in legs/feet.  Gastrointestinal: Negative for blood in stool, constipation and diarrhea.  Endocrine: Negative for increased urination.  Genitourinary: Negative for difficulty urinating, painful urination and vaginal dryness.  Musculoskeletal: Positive for arthralgias, joint pain, joint swelling, myalgias,  morning stiffness, muscle tenderness and myalgias. Negative for muscle weakness.  Skin: Positive for hair loss. Negative for color change, rash, redness, skin tightness, ulcers and sensitivity to sunlight.  Allergic/Immunologic: Negative for susceptible to infections.  Neurological: Positive for numbness and headaches. Negative for dizziness, memory loss, night sweats and weakness.  Hematological: Negative for bruising/bleeding tendency and swollen glands.  Psychiatric/Behavioral: Negative for depressed mood, confusion and sleep disturbance. The patient is not nervous/anxious.     PMFS History:  Patient Active Problem List   Diagnosis Date Noted  . Paresthesia 11/30/2016  . Neck pain 10/23/2016  . Left cervical radiculopathy 10/23/2016  . Vitamin D deficiency 08/10/2016  . PCOS (polycystic ovarian syndrome) 06/16/2013  . Migraines 06/16/2013  . Hypothyroidism 06/10/2013    Past Medical History:  Diagnosis Date  . Back pain   . Cervical pain   . PCOS (polycystic ovarian syndrome)   . Scoliosis   . Thyroid disease     Family History  Problem Relation Age of Onset  . Thyroid disease Mother   . Arthritis Mother   . Diabetes Father   . Congestive Heart Failure Father   . Alcoholism Father   . Neuropathy Father   . Arthritis Father   . Osteoarthritis Maternal Aunt   . Rheum arthritis Paternal Grandmother   . Healthy Daughter   . Polycystic ovary syndrome Daughter    Past Surgical History:  Procedure Laterality Date  . ANKLE SURGERY  2009   ORIF   . THYROIDECTOMY    . [place] EXTRACTION     Social History  Social History Narrative   Lives at home with her fiance and children.   Right-handed.   1-2 cup caffeine daily.   Immunization History  Administered Date(s) Administered  . Influenza-Unspecified 06/03/2014, 06/20/2015     Objective: Vital Signs: BP 116/73 (BP Location: Left Arm, Patient Position: Sitting, Cuff Size: Normal)   Pulse 76   Resp 15   Ht  '5\' 3"'$  (1.6 m)   Wt 189 lb 6.4 oz (85.9 kg)   BMI 33.55 kg/m    Physical Exam Vitals and nursing note reviewed.  Constitutional:      Appearance: She is well-developed.  HENT:     Head: Normocephalic and atraumatic.  Eyes:     Conjunctiva/sclera: Conjunctivae normal.  Cardiovascular:     Rate and Rhythm: Normal rate and regular rhythm.     Heart sounds: Normal heart sounds.  Pulmonary:     Effort: Pulmonary effort is normal.     Breath sounds: Normal breath sounds.  Abdominal:     General: Bowel sounds are normal.     Palpations: Abdomen is soft.  Musculoskeletal:     Cervical back: Normal range of motion.  Lymphadenopathy:     Cervical: No cervical adenopathy.  Skin:    General: Skin is warm and dry.     Capillary Refill: Capillary refill takes less than 2 seconds.  Neurological:     Mental Status: She is alert and oriented to person, place, and time.  Psychiatric:        Behavior: Behavior normal.      Musculoskeletal Exam: Patient is painful range of motion of her cervical spine thoracic and lumbar spine.  She also has tenderness over SI joints.  She has painful range of motion of bilateral shoulders and elbow joints.  She has tenderness over bilateral lateral epicondyle area.  Wrist joints with good range of motion.  She has tenderness over right ulnar styloid.  CDAI Exam: CDAI Score: -- Patient Global: --; Provider Global: -- Swollen: --; Tender: -- Joint Exam 12/09/2019   No joint exam has been documented for this visit   There is currently no information documented on the homunculus. Go to the Rheumatology activity and complete the homunculus joint exam.  Investigation: No additional findings.  Imaging: XR Foot 2 Views Left  Result Date: 11/30/2019 Mild PIP and DIP narrowing was noted.  No MTP, intertarsal or tibiotalar joint space narrowing was noted.  A screw was noted in the tibia. Impression: These findings are consistent with mild osteoarthritis and  postsurgical changes.  XR Foot 2 Views Right  Result Date: 11/30/2019 Mild PIP and DIP narrowing was noted.  No MTP, intertarsal or tibiotalar joint space narrowing was noted.  No erosive changes were noted. Impression: These findings are consistent with mild early osteoarthritis of the foot.  XR Hand 2 View Left  Result Date: 11/30/2019 [facility] narrowing and spurring was noted.  Mild fourth and fifth digit DIP narrowing was noted.  No MCP narrowing was noted.  No intercarpal radiocarpal joint space narrowing was noted.  No erosive changes were noted. Impression: These findings are consistent with mild early osteoarthritis of the hand.  XR Hand 2 View Right  Result Date: 11/30/2019 Mild DIP narrowing was noted.  No MCP, intercarpal radiocarpal joint space narrowing was noted.  No erosive changes were noted. Impression: These findings are consistent with mild osteoarthritis of the hand.  XR KNEE 3 VIEW LEFT  Result Date: 11/30/2019 No medial lateral compartment narrowing was noted.  Mild patellofemoral narrowing was noted.  No chondrocalcinosis was noted. Impression: These findings are consistent with mild chondromalacia patella of the knee joint.  XR Pelvis 1-2 Views  Result Date: 11/30/2019 No SI joint to sclerosis or narrowing was noted.  No hip joint narrowing was noted. Impression: Unremarkable x-ray of the SI joints and hip joints.   Recent Labs: Lab Results  Component Value Date   WBC 9.4 07/19/2015   HGB 13.4 07/19/2015   PLT 303 07/19/2015   NA 138 09/26/2018   K 4.1 09/26/2018   CL 104 09/26/2018   CO2 26 09/26/2018   GLUCOSE 80 09/26/2018   BUN 14 09/26/2018   CREATININE 0.68 09/26/2018   BILITOT 0.3 09/26/2018   ALKPHOS 58 07/19/2015   AST 14 09/26/2018   ALT 12 09/26/2018   PROT 6.7 09/26/2018   ALBUMIN 3.6 07/19/2015   CALCIUM 8.9 10/02/2019   GFRAA 128 09/26/2018  November 30, 2019 anti-CCP negative, 14 33 eta 3.6+, ESR 34, HLA-B27 negative  09/21/19: ANA-, ESR 32,  RF<10, CBC WNL, uric acid 5.3.  Speciality Comments: No specialty comments available.  Procedures:  No procedures performed Allergies: Patient has no known allergies.   Assessment / Plan:     Visit Diagnoses: Polyarthralgia -patient continues to have pain and stiffness in her multiple joints.  We had detailed discussion regarding the labs.  She has '14 3 3  '$ positive which can be seen in early rheumatoid arthritis.  She also has mildly elevated sedimentation rate.  She had tenderness on palpation of her right ulnar styloid.  We discussed obtaining ultrasound of her bilateral hands to look for synovitis.  She would like to postpone the ultrasound for 6 months.  She also wants to repeat labs in 6 months.  I have advised her to come in for labs 2 weeks prior to her appointment and ultrasound.  Per her request her list of natural anti-inflammatories was also given.  Plan: Sedimentation rate, Rheumatoid factor, Cyclic citrul peptide antibody, IgG, 14-3-3 eta Protein  Primary osteoarthritis of both hands -clinical and radiographic findings were consistent with early osteoarthritis.  She does have tenderness over right ulnar styloid which raises concern about possible rheumatoid arthritis.  RF negative, anti-CCP negative, 14 33 eta +, ESR mildly elevated  Chondromalacia patellae, left knee-she has chronic knee pain.  Knee joint exercises were discussed.  Primary osteoarthritis of both feet-she complains of morning stiffness in her feet.  No synovitis was noted.  Chronic SI joint pain-x-rays were unremarkable.  DDD (degenerative disc disease), cervical-she has chronic discomfort in her cervical region.  Other idiopathic scoliosis, thoracic region  DDD (degenerative disc disease), lumbar-she has chronic lower back pain.  Trochanteric bursitis, left hip-a handout on IT band stretches was given.  I also offered cortisone injection which she declined.  Myofascial pain-she continues to have generalized  pain discomfort and hyperalgesia.  There is positive family history of fibromyalgia.  Family history of rheumatoid arthritis  Vitamin D deficiency  Postoperative hypothyroidism  PCOS (polycystic ovarian syndrome)  Hx of migraines  Orders: Orders Placed This Encounter  Procedures  . Sedimentation rate  . Rheumatoid factor  . Cyclic citrul peptide antibody, IgG  . 14-3-3 eta Protein   No orders of the defined types were placed in this encounter.     Follow-Up Instructions: Return in about 6 months (around 06/09/2020) for Osteoarthritis, MFPS, +14-3-3 eta.   Bo Merino, MD  Note - This record has been created using Editor, commissioning.  Chart  creation errors have been sought, but may not always  have been located. Such creation errors do not reflect on  the standard of medical care.

## 2019-12-30 ENCOUNTER — Ambulatory Visit: Payer: Self-pay | Admitting: Rheumatology

## 2020-01-04 ENCOUNTER — Ambulatory Visit: Payer: Self-pay | Admitting: Rheumatology

## 2020-03-17 ENCOUNTER — Ambulatory Visit
Admission: RE | Admit: 2020-03-17 | Discharge: 2020-03-17 | Disposition: A | Discharge status: Home / Self Care | Payer: 59 | Source: Ambulatory Visit | Attending: Emergency Medicine | Admitting: Emergency Medicine

## 2020-03-17 ENCOUNTER — Other Ambulatory Visit: Payer: Self-pay

## 2020-03-17 VITALS — BP 137/91 | HR 90 | Temp 98.2°F | Resp 18

## 2020-03-17 DIAGNOSIS — M25552 Pain in left hip: Secondary | ICD-10-CM | POA: Diagnosis not present

## 2020-03-17 LAB — POCT URINALYSIS DIP (MANUAL ENTRY)
Bilirubin, UA: NEGATIVE
Glucose, UA: NEGATIVE mg/dL
Ketones, POC UA: NEGATIVE mg/dL
Leukocytes, UA: NEGATIVE
Nitrite, UA: NEGATIVE
Protein Ur, POC: NEGATIVE mg/dL
Spec Grav, UA: 1.025 (ref 1.010–1.025)
Urobilinogen, UA: 0.2 E.U./dL
pH, UA: 7 (ref 5.0–8.0)

## 2020-03-17 LAB — POCT URINE PREGNANCY: Preg Test, Ur: NEGATIVE

## 2020-03-17 MED ORDER — ONDANSETRON 4 MG PO TBDP: 4.0000 mg | ORAL_TABLET | Freq: Three times a day (TID) | ORAL | 0 refills | Status: DC | PRN

## 2020-03-17 MED ORDER — NAPROXEN 500 MG PO TABS: 500.0000 mg | ORAL_TABLET | Freq: Two times a day (BID) | ORAL | 0 refills | Status: DC

## 2020-03-17 NOTE — ED Triage Notes (Signed)
Pt c/o LLQ pain since Monday that has worsen since last night with nausea. States having some urinary difficulty and frequency.

## 2020-03-17 NOTE — Discharge Instructions (Signed)
Heat therapy (hot compress, warm wash rag, hot showers, etc.) can help relax muscles and soothe muscle aches. Cold therapy (ice packs) can be used to help swelling both after injury and after prolonged use of areas of chronic pain/aches.  For pain: naproxen

## 2020-03-17 NOTE — ED Provider Notes (Signed)
[facility]    CSN: [id number] Arrival date & time: 03/17/20  1751      History   Chief Complaint Chief Complaint  Patient presents with  . Abdominal Pain    HPI Taylor Bennett is a 39 y.o. female with history of hypothyroidism PCOS, chronic low back pain presenting for LLQ pain since Monday.  Patient states has been intermittent, though worse over the last few days.  Patient denies history of abdominal pelvic surgery.  Has had some nausea without vomiting.  Has had urinary discomfort and frequency without hematuria.  Patient denies vaginal discharge.  LMP 02/28/2020: Currently sexually active with 1 female partner.  Denies fever, arthralgias, myalgias, history of diverticulitis, ovarian torsion.  No chest pain, palpitations, difficulty breathing.  Past Medical History:  Diagnosis Date  . Back pain   . Cervical pain   . PCOS (polycystic ovarian syndrome)   . Scoliosis   . Thyroid disease     Patient Active Problem List   Diagnosis Date Noted  . Paresthesia 11/30/2016  . Neck pain 10/23/2016  . Left cervical radiculopathy 10/23/2016  . Vitamin D deficiency 08/10/2016  . PCOS (polycystic ovarian syndrome) 06/16/2013  . Migraines 06/16/2013  . Hypothyroidism 06/10/2013    Past Surgical History:  Procedure Laterality Date  . ANKLE SURGERY  2009   ORIF   . THYROIDECTOMY    . WISDOM TOOTH EXTRACTION      OB History   No obstetric history on file.      Home Medications    Prior to Admission medications   Medication Sig Start Date End Date Taking? Authorizing Provider  ARMOUR THYROID 60 MG tablet Take 1 tablet (60 mg total) by mouth every other day. 10/12/19   Philemon Kingdom, MD  ARMOUR THYROID 90 MG tablet Take 1 tablet (90 mg total) by mouth every other day. 10/12/19   Philemon Kingdom, MD  Ascorbic Acid (VITAMIN C PO) Take by mouth daily.    [provider]  Cholecalciferol (VITAMIN D PO) Take 5,000 Units by mouth 2 (two) times daily.      [provider]  folic acid (FOLVITE) 992 MCG tablet Take 400 mcg by mouth daily.    [provider]  ibuprofen (ADVIL,MOTRIN) 200 MG tablet Take 200 mg by mouth every 6 (six) hours as needed. Takes 600mg  - 800mg  daily, as needed.    [provider]  loratadine (CLARITIN) 10 MG tablet Take 10 mg by mouth daily.    [provider]  meclizine (ANTIVERT) 25 MG tablet Take 1 tablet (25 mg total) by mouth 3 (three) times daily as needed for dizziness. 07/19/15   Harvel Quale, MD  metaxalone (SKELAXIN) 800 MG tablet Take 800 mg by mouth daily as needed for muscle spasms.    [provider]  Multiple Vitamin (MULTIVITAMIN PO) Take by mouth daily.    [provider]  naproxen (NAPROSYN) 500 MG tablet Take 1 tablet (500 mg total) by mouth 2 (two) times daily. 03/17/20   Hall-Potvin, [place], PA-C  Omega-3 Fatty Acids (FISH OIL) 1000 MG CAPS Take by mouth.    [provider]  ondansetron (ZOFRAN ODT) 4 MG disintegrating tablet Take 1 tablet (4 mg total) by mouth every 8 (eight) hours as needed for nausea or vomiting. 03/17/20   Hall-Potvin, [place], PA-C  rizatriptan (MAXALT) 10 MG tablet Take 10 mg by mouth as needed for migraine. May repeat in 2 hours if needed    [provider]  ZINC CITRATE-PHYTASE PO Take by mouth.    [provider]    Family History Family History  Problem Relation Age of Onset  . Thyroid disease Mother   . Arthritis Mother   . Diabetes Father   . Congestive Heart Failure Father   . Alcoholism Father   . Neuropathy Father   . Arthritis Father   . Osteoarthritis Maternal Aunt   . Rheum arthritis Paternal Grandmother   . Healthy Daughter   . Polycystic ovary syndrome Daughter     Social History Social History   Tobacco Use  . Smoking status: Former Smoker    Start date: 10/13/2011  . Smokeless tobacco: Never Used  Vaping Use  . Vaping Use: Never used  Substance Use Topics  . Alcohol  use: Yes    Comment: occ  . Drug use: No     Allergies   Patient has no known allergies.   Review of Systems As per HPI   Physical Exam Triage Vital Signs ED Triage Vitals  Enc Vitals Group     BP      Pulse      Resp      Temp      Temp src      SpO2      Weight      Height      Head Circumference      Peak Flow      Pain Score      Pain Loc      Pain Edu?      Excl. in [place]?    No data found.  Updated Vital Signs BP (!) 137/91 (BP Location: Left Arm)   Pulse 90   Temp 98.2 F (36.8 C) (Oral)   Resp 18   LMP 02/28/2020   SpO2 98%   Visual Acuity Right Eye Distance:   Left Eye Distance:   Bilateral Distance:    Right Eye Near:   Left Eye Near:    Bilateral Near:     Physical Exam Constitutional:      General: She is not in acute distress. HENT:     Head: Normocephalic and atraumatic.  Eyes:     General: No scleral icterus.    Pupils: Pupils are equal, round, and reactive to light.  Cardiovascular:     Rate and Rhythm: Normal rate.  Pulmonary:     Effort: Pulmonary effort is normal.  Abdominal:     General: Abdomen is flat. Bowel sounds are normal. There is no distension.     Palpations: Abdomen is soft. There is no hepatomegaly or splenomegaly.     Tenderness: There is abdominal tenderness in the left lower quadrant. Negative signs include Murphy's sign, Rovsing's sign and McBurney's sign.     Hernia: No hernia is present.  Genitourinary:    Vagina: Normal.     Cervix: No cervical motion tenderness or discharge.     Uterus: Normal.      Adnexa: Right adnexa normal and left adnexa normal.  Musculoskeletal:     Comments: Hip flexor tenderness without crepitus or deformity.  Worse with hip extension.  NVI  Skin:    Coloration: Skin is not jaundiced or pale.  Neurological:     Mental Status: She is alert and oriented to person, place, and time.      UC Treatments / Results  Labs (all labs ordered are listed, but only abnormal results  are displayed) Labs Reviewed  POCT URINALYSIS DIP (MANUAL ENTRY) -  Abnormal; Notable for the following components:      Result Value   Blood, UA trace-intact (*)    All other components within normal limits  POCT URINE PREGNANCY    EKG   Radiology No results found.  Procedures Procedures (including critical care time)  Medications Ordered in UC Medications - No data to display  Initial Impression / Assessment and Plan / UC Course  I have reviewed the triage vital signs and the nursing notes.  Pertinent labs & imaging results that were available during my care of the patient were reviewed by me and considered in my medical decision making (see chart for details).     Patient febrile, nontoxic in office today.  Hemodynamically stable without hematochezia, melena, emesis or chest pain.  Urine dipstick significant for trace-intact blood, otherwise unremarkable.  Urine pregnancy negative.  Abdominal pelvic exam reassuring at this time: More likely MSK with left hip flexor, though discussed differentials including ovarian cyst, ovarian torsion, flatus, diverticulitis/colitis.  Return precautions discussed, patient verbalized understanding and is agreeable to plan. Final Clinical Impressions(s) / UC Diagnoses   Final diagnoses:  Left hip pain     Discharge Instructions     Heat therapy (hot compress, warm wash rag, hot showers, etc.) can help relax muscles and soothe muscle aches. Cold therapy (ice packs) can be used to help swelling both after injury and after prolonged use of areas of chronic pain/aches.  For pain: naproxen    ED Prescriptions    Medication Sig Dispense Auth. Provider   naproxen (NAPROSYN) 500 MG tablet Take 1 tablet (500 mg total) by mouth 2 (two) times daily. 30 tablet Hall-Potvin, [place], PA-C   ondansetron (ZOFRAN ODT) 4 MG disintegrating tablet Take 1 tablet (4 mg total) by mouth every 8 (eight) hours as needed for nausea or vomiting. 21 tablet  Hall-Potvin, [place], PA-C     PDMP not reviewed this encounter.   Neldon Mc [place], [place] 03/17/20 1931

## 2020-03-28 DIAGNOSIS — Z01419 Encounter for gynecological examination (general) (routine) without abnormal findings: Secondary | ICD-10-CM | POA: Diagnosis not present

## 2020-03-28 DIAGNOSIS — N644 Mastodynia: Secondary | ICD-10-CM | POA: Diagnosis not present

## 2020-03-29 ENCOUNTER — Other Ambulatory Visit: Payer: Self-pay | Admitting: Obstetrics and Gynecology

## 2020-03-29 DIAGNOSIS — N63 Unspecified lump in unspecified breast: Secondary | ICD-10-CM

## 2020-04-01 ENCOUNTER — Other Ambulatory Visit: Payer: Self-pay

## 2020-04-01 ENCOUNTER — Encounter: Payer: Self-pay | Admitting: Internal Medicine

## 2020-04-01 ENCOUNTER — Ambulatory Visit: Payer: 59 | Admitting: Internal Medicine

## 2020-04-01 VITALS — BP 118/80 | HR 76 | Ht 63.0 in | Wt 192.0 lb

## 2020-04-01 DIAGNOSIS — E559 Vitamin D deficiency, unspecified: Secondary | ICD-10-CM

## 2020-04-01 DIAGNOSIS — E890 Postprocedural hypothyroidism: Secondary | ICD-10-CM

## 2020-04-01 DIAGNOSIS — E538 Deficiency of other specified B group vitamins: Secondary | ICD-10-CM | POA: Diagnosis not present

## 2020-04-01 DIAGNOSIS — E282 Polycystic ovarian syndrome: Secondary | ICD-10-CM | POA: Diagnosis not present

## 2020-04-01 LAB — VITAMIN B12: Vitamin B-12: 503 pg/mL (ref 211–911)

## 2020-04-01 LAB — T3, FREE: T3, Free: 3.2 pg/mL (ref 2.3–4.2)

## 2020-04-01 LAB — TSH: TSH: 5.59 u[IU]/mL — ABNORMAL HIGH (ref 0.35–4.50)

## 2020-04-01 LAB — T4, FREE: Free T4: 0.6 ng/dL (ref 0.60–1.60)

## 2020-04-01 NOTE — Patient Instructions (Signed)
Please stop at the lab.  Continue Armour 90 mg with 60 mg every other day.  Take the thyroid hormone every day, with water, at least 30 minutes before breakfast, separated by at least 4 hours from: - acid reflux medications - calcium - iron - multivitamins  Please come back for a follow-up appointment in 6 months.

## 2020-04-01 NOTE — Progress Notes (Addendum)
Patient ID: Taylor Bennett, female   DOB: 1981/03/10, 39 y.o.   MRN: [id number]   This visit occurred during the SARS-CoV-2 public health emergency.  Safety protocols were in place, including screening questions prior to the visit, additional usage of staff PPE, and extensive cleaning of exam room while observing appropriate contact time as indicated for disinfecting solutions.   HPI  Taylor Bennett is a 39 y.o.-year-old female, initially referred by her PCP, Redmon, Noelle, PA, returning for follow-up for postsurgical hypothyroidism and hypoparathyroidism and vitamin D deficiency, low vitamin B12. She previously saw Dr. Dwyane Dee.  Last visit with me 6 months ago  Since last OV, she started to see Dr Estanislado Pandy >> was dx'ed with RA >> will likely start MTX during the fall.  Reviewed calcium and PTH levels: Lab Results  Component Value Date   PTH CANCELED 10/02/2019   PTH Comment 10/02/2019   PTH 9 (L) 05/08/2019   CALCIUM 8.9 10/02/2019   CALCIUM 9.5 05/08/2019   CALCIUM 9.3 09/26/2018   CALCIUM 8.8 (L) 07/19/2015  05/29/2019: Calcium 8.2 (8.7-10.2)  Her magnesium and phosphorus levels were normal: Component     Latest Ref Rng & Units 05/08/2019  Phosphorus     2.3 - 4.6 mg/dL 3.2  Magnesium     1.5 - 2.5 mg/dL 1.7   Reviewed and addended history: Pt. has a h/o Hashimoto's thyroiditis and now has postsurgical hypothyroidismafter her total thyroidectomy for a suspicious nodule on 12/19/2015 (final pathology >> benign). Surgery was performed by Dr. Celine Ahr. She had compression sxs before the surgery >> now resolved.  She could not tolerate Synthroid due to significant fatigue.  She is on Armour Thyroid 60 alternating with 90 mg every other day (equivalent of 125 mcg levothyroxine daily): - in am - fasting - at least 30 min from b'fast - no Ca, Fe, PPIs, + multivitamins later in the day - not on Biotin  Reviewed her TFTs: Lab Results  Component Value Date   TSH 2.320 10/02/2019   TSH 2.42  09/26/2018   TSH 2.49 07/25/2018   TSH 5.340 (H) 05/02/2018   TSH 2.770 10/10/2017   TSH 1.41 02/14/2017   TSH 3.42 08/09/2016   TSH 0.94 07/28/2015   TSH 0.962 07/19/2015   TSH 0.66 06/07/2014   FREET4 0.78 (L) 10/02/2019   FREET4 0.70 09/26/2018   FREET4 0.69 07/25/2018   FREET4 0.80 (L) 05/02/2018   FREET4 0.89 10/10/2017   FREET4 0.74 02/14/2017   FREET4 0.52 (L) 08/09/2016   FREET4 0.60 07/28/2015   FREET4 0.77 06/07/2014   FREET4 0.68 12/23/2013   05/29/2019: TSH 2.48 05/06/2019: TSH 2.28 01/2016: TSH 2.6  Pt denies: - feeling nodules in neck - dysphagia - choking - SOB with lying down + Mild dysphagia occasionally after surgery. Also, occasionally hoarseness, cough.  She has + FH of thyroid disorders in: mother. No FH of thyroid cancer. No h/o radiation tx to head or neck.  No herbal supplements. No Biotin use. No recent steroids use.   PCOS: -She was on metformin- but stopped 2/2 diarrhea in 07/2017 >> restarted 09/2017 but stopped that she did not like how it made her feel -She was on NuvaRing (per Dr. Landry Mellow) but now off for almost 3 years -Restarted Ovasitol 09/2017 but did not continue it -Menses are regular -No hirsutism or acne, but does have hair loss  Vitamin D deficiency:  Vitamin D levels reviewed: Lab Results  Component Value Date   VD25OH 87.3 10/02/2019   VD25OH  40.72 05/08/2019   VD25OH 32.22 09/26/2018   VD25OH 24.33 (L) 07/25/2018   VD25OH 50.7 09/26/2017   VD25OH 49.42 02/14/2017   VD25OH 24.47 (L) 10/23/2016   VD25OH 12.07 (L) 08/09/2016   She continues on 5000 units x 2  vitamin D3 daily.  Low vitamin B12:  In 09/2018 we started supplementation with 1000 mcg of B12 daily. However, at last visit, she was only taking a B complex but vitamin B 12 level was normal. Since then >> she changed to a MVI: Lab Results  Component Value Date   VITAMINB12 863 10/02/2019   VITAMINB12 310 09/26/2018   VITAMINB12 550 09/26/2017   She also had a  severe vertigo episode in summer 2018 >> on Meclizine.  She also has DDD in cervical spine. She has a family history of RA.  ROS: Constitutional: no weight gain/no weight loss, no fatigue, no subjective hyperthermia, no subjective hypothermia Eyes: no blurry vision, no xerophthalmia ENT: no sore throat, + see HPI Cardiovascular: no CP/no SOB/no palpitations/no leg swelling Respiratory: no cough/no SOB/no wheezing Gastrointestinal: no N/no V/no D/no C/no acid reflux Musculoskeletal: no muscle aches/+ joint aches Skin: no rashes, no hair loss Neurological: no tremors/no numbness/no tingling/no dizziness  I reviewed pt's medications, allergies, PMH, social hx, family hx, and changes were documented in the history of present illness. Otherwise, unchanged from my initial visit note.  Past Medical History:  Diagnosis Date  . Back pain   . Cervical pain   . PCOS (polycystic ovarian syndrome)   . Scoliosis   . Thyroid disease    Past Surgical History:  Procedure Laterality Date  . ANKLE SURGERY  2009   ORIF   . THYROIDECTOMY    . WISDOM TOOTH EXTRACTION     Social History   Social History  . Marital status: Single    Spouse name: N/A  . Number of children: N/A   Occupational History  . Not on file.   Social History Main Topics  . Smoking status: Never Smoker  . Smokeless tobacco: Never Used  . Alcohol use No  . Drug use: No   Current Outpatient Medications on File Prior to Visit  Medication Sig Dispense Refill  . ARMOUR THYROID 60 MG tablet Take 1 tablet (60 mg total) by mouth every other day. 45 tablet 3  . ARMOUR THYROID 90 MG tablet Take 1 tablet (90 mg total) by mouth every other day. 45 tablet 3  . Ascorbic Acid (VITAMIN C PO) Take by mouth daily.    . Cholecalciferol (VITAMIN D PO) Take 5,000 Units by mouth 2 (two) times daily.     . folic acid (FOLVITE) 161 MCG tablet Take 400 mcg by mouth daily.    Marland Kitchen ibuprofen (ADVIL,MOTRIN) 200 MG tablet Take 200 mg by mouth  every 6 (six) hours as needed. Takes 600mg  - 800mg  daily, as needed.    . loratadine (CLARITIN) 10 MG tablet Take 10 mg by mouth daily.    . meclizine (ANTIVERT) 25 MG tablet Take 1 tablet (25 mg total) by mouth 3 (three) times daily as needed for dizziness. 20 tablet 0  . metaxalone (SKELAXIN) 800 MG tablet Take 800 mg by mouth daily as needed for muscle spasms.    . Multiple Vitamin (MULTIVITAMIN PO) Take by mouth daily.    . naproxen (NAPROSYN) 500 MG tablet Take 1 tablet (500 mg total) by mouth 2 (two) times daily. 30 tablet 0  . Omega-3 Fatty Acids (FISH OIL) 1000 MG CAPS  Take by mouth.    . ondansetron (ZOFRAN ODT) 4 MG disintegrating tablet Take 1 tablet (4 mg total) by mouth every 8 (eight) hours as needed for nausea or vomiting. 21 tablet 0  . rizatriptan (MAXALT) 10 MG tablet Take 10 mg by mouth as needed for migraine. May repeat in 2 hours if needed    . ZINC CITRATE-PHYTASE PO Take by mouth.     No current facility-administered medications on file prior to visit.   No Known Allergies Family History  Problem Relation Age of Onset  . Thyroid disease Mother   . Arthritis Mother   . Diabetes Father   . Congestive Heart Failure Father   . Alcoholism Father   . Neuropathy Father   . Arthritis Father   . Osteoarthritis Maternal Aunt   . Rheum arthritis Paternal Grandmother   . Healthy Daughter   . Polycystic ovary syndrome Daughter    PE: BP 118/80   Pulse 76   Ht 5\' 3"  (1.6 m)   Wt 192 lb (87.1 kg)   SpO2 97%   BMI 34.01 kg/m  Wt Readings from Last 3 Encounters:  04/01/20 192 lb (87.1 kg)  12/09/19 189 lb 6.4 oz (85.9 kg)  11/30/19 188 lb 12.8 oz (85.6 kg)   Constitutional: overweight, in NAD Eyes: PERRLA, EOMI, no exophthalmos ENT: moist mucous membranes, no neck masses palpated, no cervical lymphadenopathy Cardiovascular: RRR, No MRG Respiratory: CTA B Gastrointestinal: abdomen soft, NT, ND, BS+ Musculoskeletal: no deformities, strength intact in all 4 Skin:  moist, warm, no rashes Neurological: no tremor with outstretched hands, DTR normal in all 4  ASSESSMENT: 1. Postsurgical Hypothyroidism  2. Vitamin D deficiency  3.  Low vitamin B12  4.  PCOS  5.  Hypocalcemia  PLAN:  1. Patient with longstanding hypothyroidism, after total thyroidectomy. She could not tolerate levothyroxine and is now on Armour Thyroid. - latest thyroid labs reviewed with pt >> normal: Lab Results  Component Value Date   TSH 2.320 10/02/2019   - she continues on Armour 90 alternating with 60 mg every other day - pt feels good on this dose. - we discussed about taking the thyroid hormone every day, with water, >30 minutes before breakfast, separated by >4 hours from acid reflux medications, calcium, iron, multivitamins. Pt. is taking it correctly. - will check thyroid tests today: TSH, free T3 and fT4 - If labs are abnormal, she will need to return for repeat TFTs in 1.5 months  2. Vitamin D deficiency -Continues on 10,000 units vitamin D daily -level was normal at last visit -We'll recheck this today  3. Low vitamin B12 -Discovered during investigation for fatigue -I advised her to start B12 1000 mcg daily but at last visit she was only on B complex with 30 mcg B12 -I recheck the B12 level at that time and this was normal so we continued the same supplement dose -however, she now tells me that she only takes this from the multivitamin - we'll recheck this today  4.  PCOS -Previously on Ovasitol and Metformin, now off -Irregular menstrual cycles -Latest HbA1c was excellent, 5.2% in 09/2019 -we'll repeat this at next visit -CMP and lipids were normal with exception of a slightly high LDL-we will also repeat these at next visit  5.  Hypocalcemia -Possibly developed after the thyroid surgery but also possibly due to vitamin D deficiency or excessive hydration -Latest calcium level was normal at last visit. -No perioral numbness and a chronic cramping but  occasionally generalized aches and pains -We will recheck a calcium and PTH level today -We will target the low normal range for the calcium  Orders Placed This Encounter  Procedures  . PTH, intact and calcium  . VITAMIN D 25 Hydroxy (Vit-D Deficiency, Fractures)  . TSH  . T4, free  . T3, free  . Vitamin B12   Component     Latest Ref Rng & Units 04/01/2020  Calcium     8.7 - 10.2 mg/dL 9.1  PTH, Intact     15 - 65 pg/mL 12 (L)  PTH Interp      Comment  TSH     0.35 - 4.50 uIU/mL 5.59 (H)  T4,Free(Direct)     0.60 - 1.60 ng/dL 0.60  Triiodothyronine,Free,Serum     2.3 - 4.2 pg/mL 3.2  Vitamin D, 25-Hydroxy     30.0 - 100.0 ng/mL 83.9  Vitamin B12     211 - 911 pg/mL 503  TSH slightly high.  Will double check with her if she did not miss any doses.  If not, I will have her take 90 mg of Armour for out of 7 days and 60 mg 3 out of 7 days and recheck in 1.5 months. Vitamin B12 is normal. Vitamin D is normal. Calcium normal, while PTH is a little low.  Philemon Kingdom, MD PhD [facility]

## 2020-04-02 LAB — PTH, INTACT AND CALCIUM
Calcium: 9.1 mg/dL (ref 8.7–10.2)
PTH: 12 pg/mL — ABNORMAL LOW (ref 15–65)

## 2020-04-02 LAB — VITAMIN D 25 HYDROXY (VIT D DEFICIENCY, FRACTURES): Vit D, 25-Hydroxy: 83.9 ng/mL (ref 30.0–100.0)

## 2020-04-12 ENCOUNTER — Ambulatory Visit
Admission: RE | Admit: 2020-04-12 | Discharge: 2020-04-12 | Disposition: A | Discharge status: Home / Self Care | Payer: 59 | Source: Ambulatory Visit | Attending: Obstetrics and Gynecology | Admitting: Obstetrics and Gynecology

## 2020-04-12 ENCOUNTER — Other Ambulatory Visit: Payer: Self-pay | Admitting: Obstetrics and Gynecology

## 2020-04-12 ENCOUNTER — Other Ambulatory Visit: Payer: Self-pay

## 2020-04-12 ENCOUNTER — Ambulatory Visit (HOSPITAL_COMMUNITY): Payer: 59 | Attending: Obstetrics and Gynecology

## 2020-04-12 DIAGNOSIS — N63 Unspecified lump in unspecified breast: Secondary | ICD-10-CM

## 2020-04-12 DIAGNOSIS — N6489 Other specified disorders of breast: Secondary | ICD-10-CM | POA: Diagnosis not present

## 2020-04-12 DIAGNOSIS — R922 Inconclusive mammogram: Secondary | ICD-10-CM | POA: Diagnosis not present

## 2020-04-12 IMAGING — US US BREAST*L* LIMITED INC AXILLA
1 series · 4 of 4 positions shown · non-contrast
Comparison: Previous exam(s).

CLINICAL DATA: The patient's physician reports a palpable lump at 2
o'clock in the left breast. The patient feels a palpable lump
medially and inferiorly.

EXAM:
DIGITAL DIAGNOSTIC BILATERAL MAMMOGRAM WITH CAD AND TOMO
ULTRASOUND BILATERAL BREAST

[Series 1: us breast*left* limited inc axilla · 0.07mm/px · 4 of 4 slices shown]
[im 1/4]
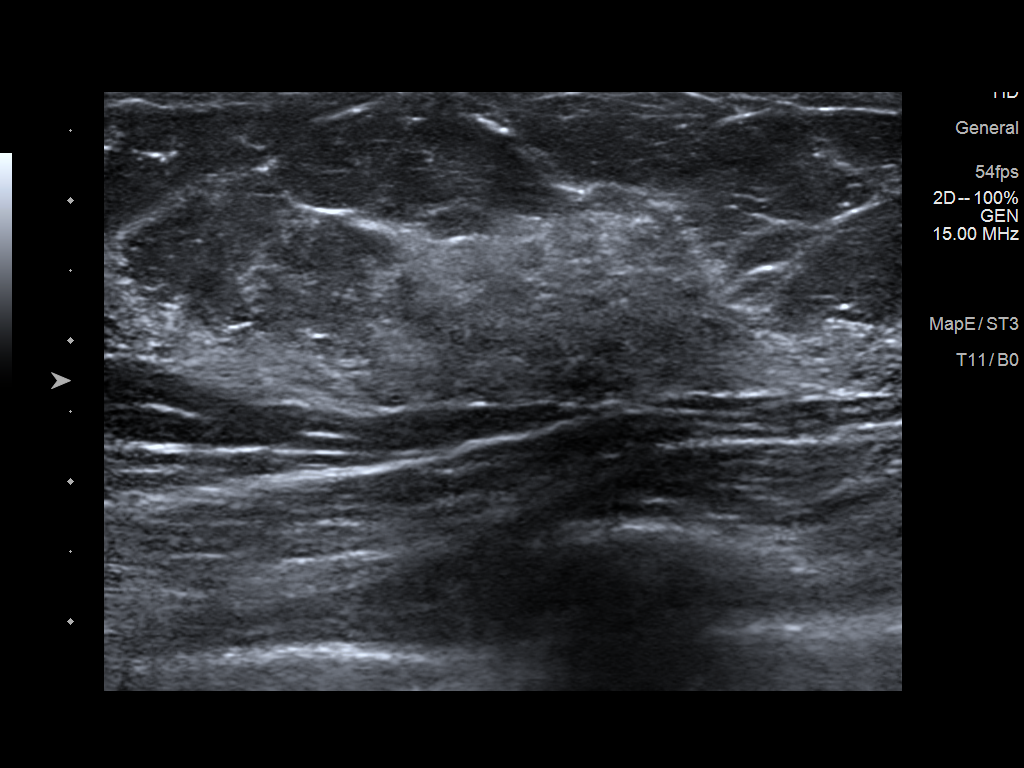
[im 2/4]
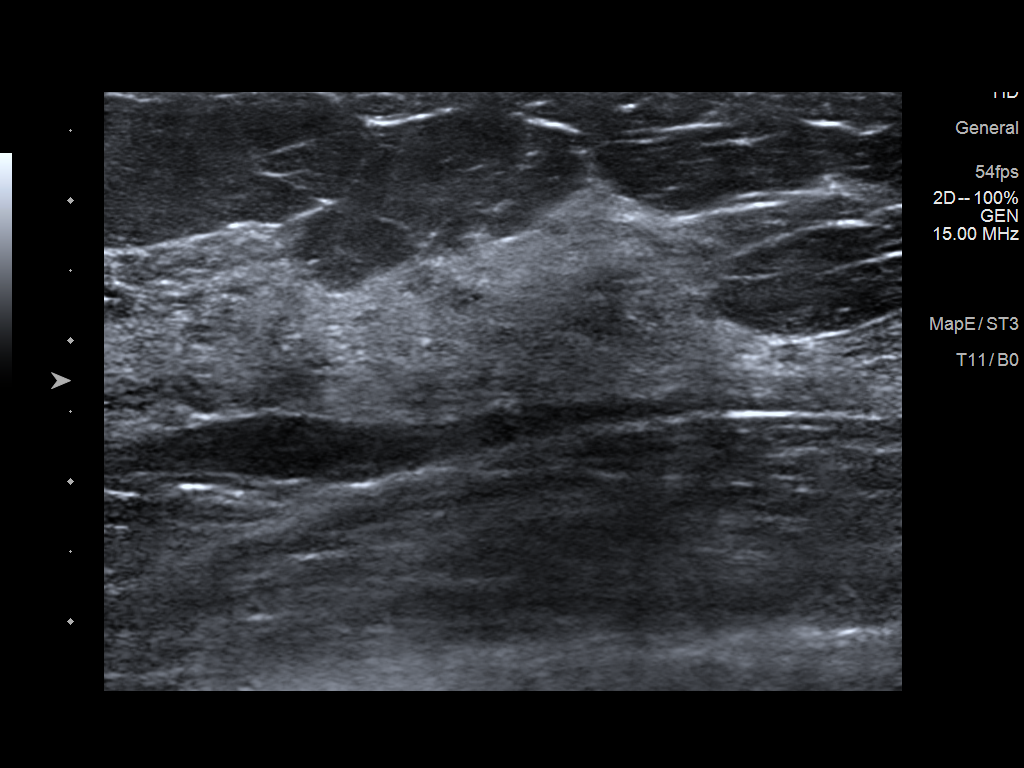
[im 3/4]
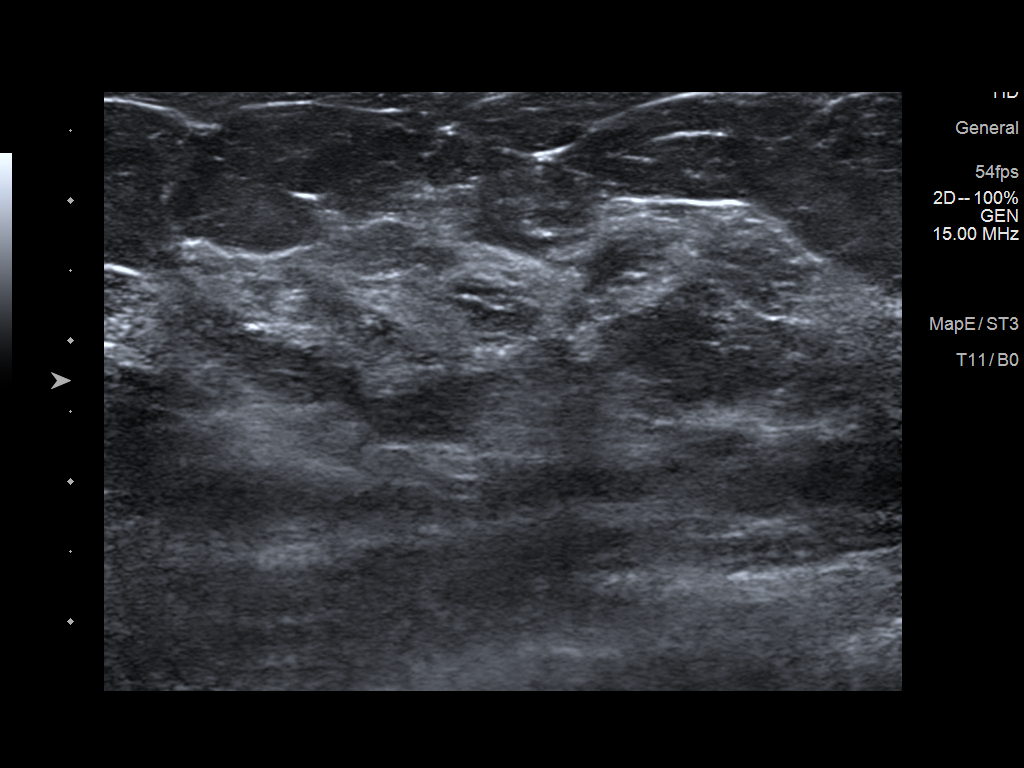
[im 4/4]
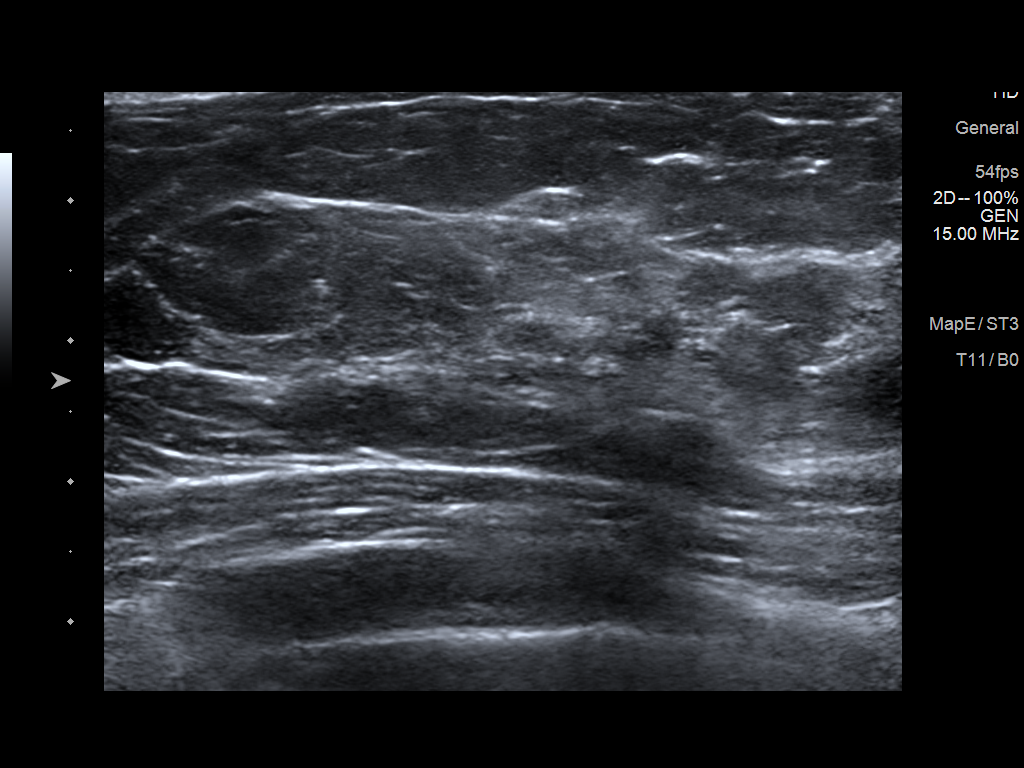

[4 of 4 positions shown; findings below may reference images not displayed]

ACR Breast Density Category c: The breast tissue is heterogeneously
dense, which may obscure small masses.
FINDINGS: Benign calcifications are seen bilaterally. There is a new mass in
the medial right breast. No other suspicious mammographic findings
are identified.

Mammographic images were processed with CAD.

On physical exam, no suspicious lumps are identified.

Targeted ultrasound is performed, showing fibrocystic changes in the
right breast medially correlating with the mammographically
identified mass. No suspicious findings are seen on the left to
correlate with palpable lumps felt by the patient and her physician.
IMPRESSION: No mammographic or sonographic evidence of malignancy. Fibrocystic
changes.

RECOMMENDATION:
Annual screening mammography. Treatment of the patient's symptoms
should be based on clinical and physical exam given lack of imaging
findings.

I have discussed the findings and recommendations with the patient.
If applicable, a reminder letter will be sent to the patient
regarding the next appointment.

BI-RADS CATEGORY  2: Benign.

## 2020-04-12 IMAGING — MG DIGITAL DIAGNOSTIC BILAT W/ TOMO W/ CAD
6 of 10 series · 6 of 30 positions shown · non-contrast
Comparison: Previous exam(s).

CLINICAL DATA: The patient's physician reports a palpable lump at 2
o'clock in the left breast. The patient feels a palpable lump
medially and inferiorly.

EXAM:
DIGITAL DIAGNOSTIC BILATERAL MAMMOGRAM WITH CAD AND TOMO
ULTRASOUND BILATERAL BREAST

[R MLO synth-2D]
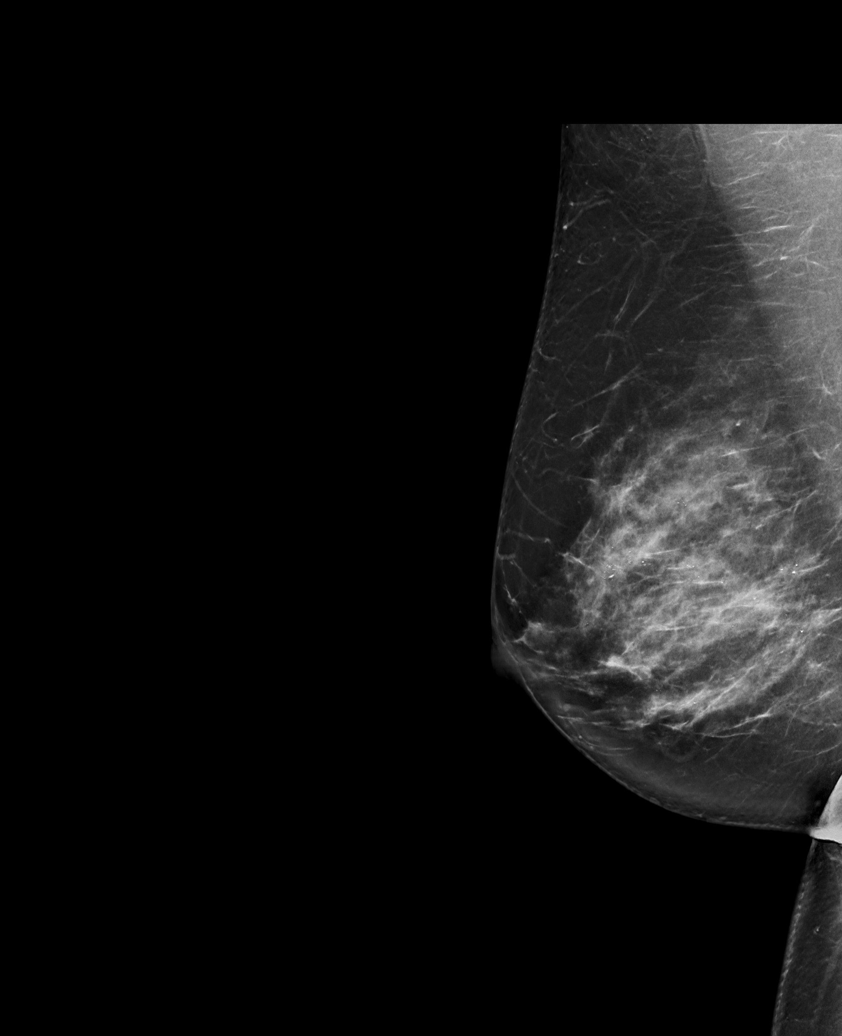

[R CC synth-2D]
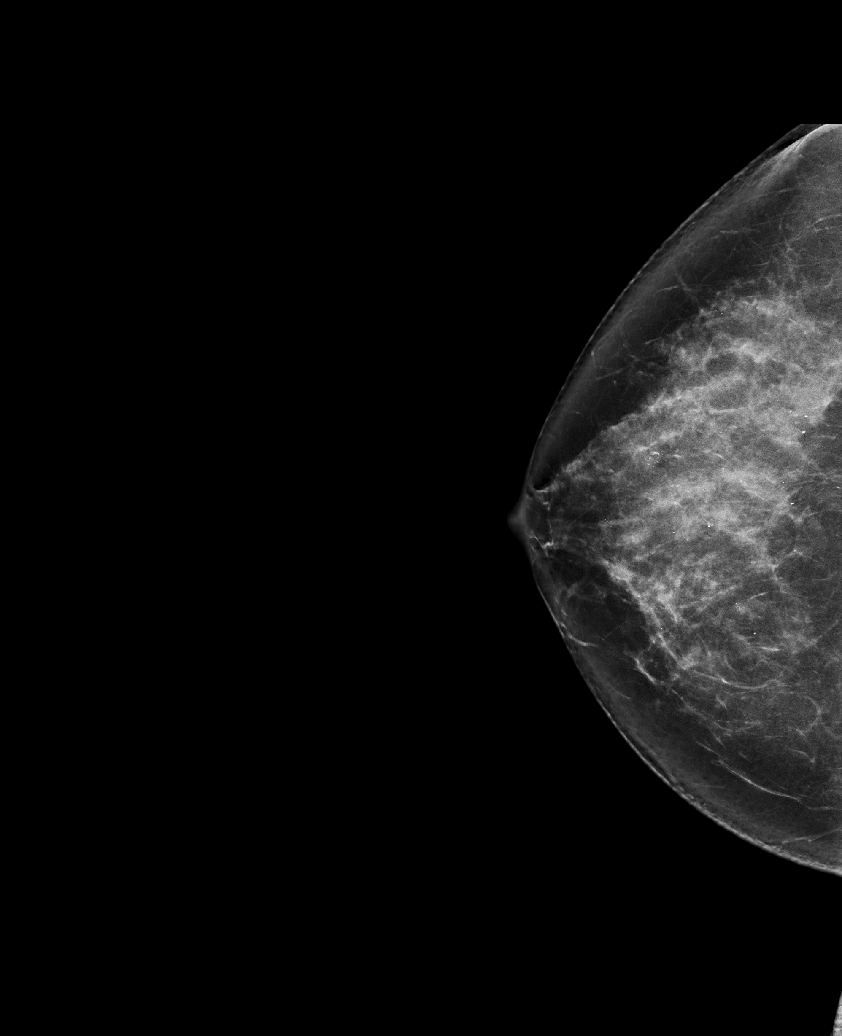

[L TAN synth-2D]
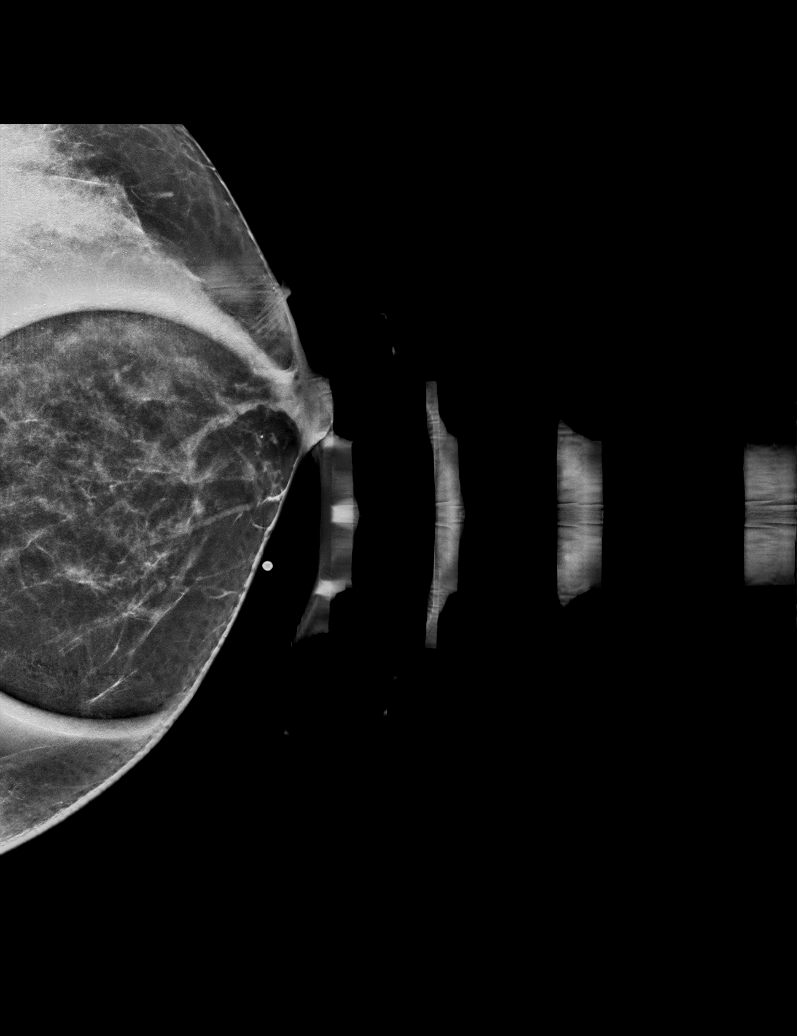

[L MLO synth-2D]
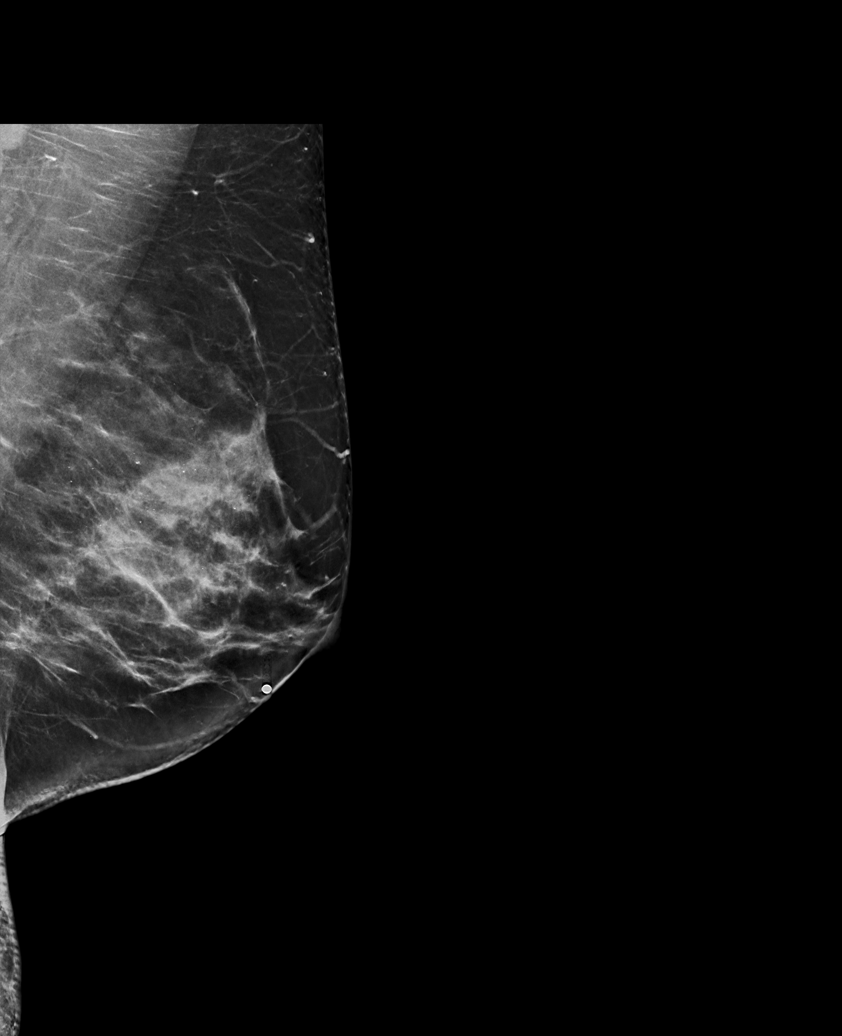

[L CC synth-2D]
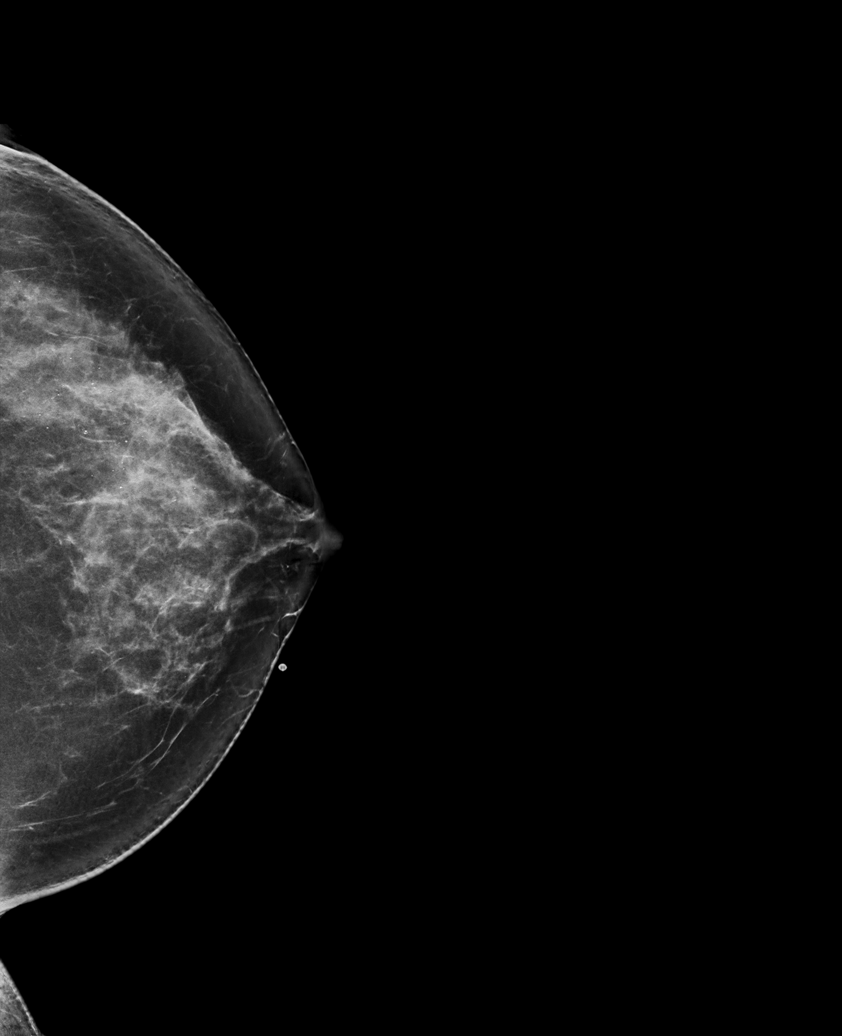

[L TAN tomo · tomo slice 33/65.0]
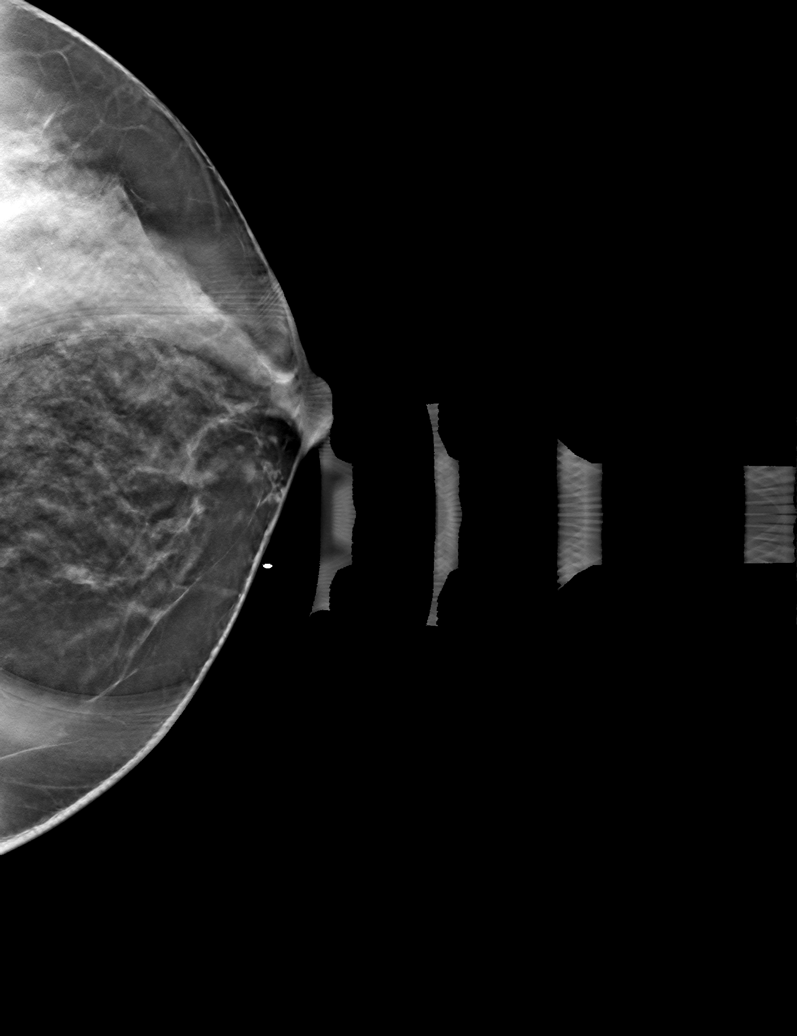

[6 of 30 positions shown; findings below may reference images not displayed]

ACR Breast Density Category c: The breast tissue is heterogeneously
dense, which may obscure small masses.
FINDINGS: Benign calcifications are seen bilaterally. There is a new mass in
the medial right breast. No other suspicious mammographic findings
are identified.

Mammographic images were processed with CAD.

On physical exam, no suspicious lumps are identified.

Targeted ultrasound is performed, showing fibrocystic changes in the
right breast medially correlating with the mammographically
identified mass. No suspicious findings are seen on the left to
correlate with palpable lumps felt by the patient and her physician.
IMPRESSION: No mammographic or sonographic evidence of malignancy. Fibrocystic
changes.

RECOMMENDATION:
Annual screening mammography. Treatment of the patient's symptoms
should be based on clinical and physical exam given lack of imaging
findings.

I have discussed the findings and recommendations with the patient.
If applicable, a reminder letter will be sent to the patient
regarding the next appointment.

BI-RADS CATEGORY  2: Benign.

## 2020-04-12 IMAGING — US US BREAST*R* LIMITED INC AXILLA
1 series · 13 of 15 positions shown · non-contrast
Comparison: Previous exam(s).

CLINICAL DATA: The patient's physician reports a palpable lump at 2
o'clock in the left breast. The patient feels a palpable lump
medially and inferiorly.

EXAM:
DIGITAL DIAGNOSTIC BILATERAL MAMMOGRAM WITH CAD AND TOMO
ULTRASOUND BILATERAL BREAST

[Series 1: us breast*right* limited inc axilla · 0.07mm/px · 13 of 15 slices shown]
[im 1/15]
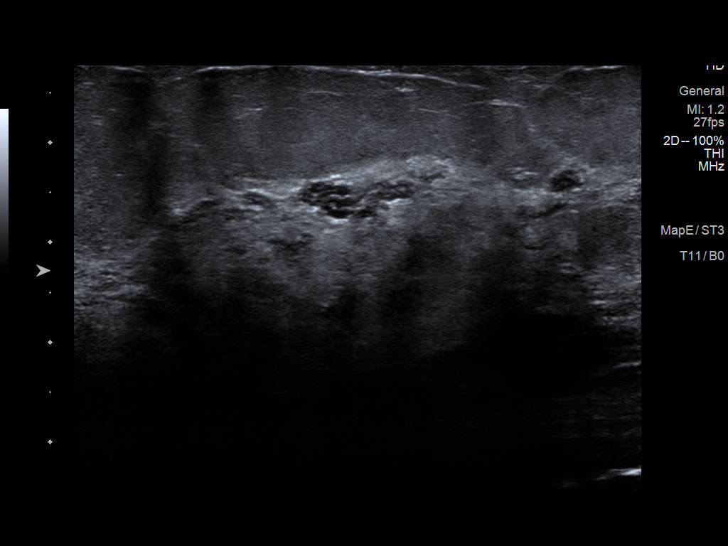
[im 2/15]
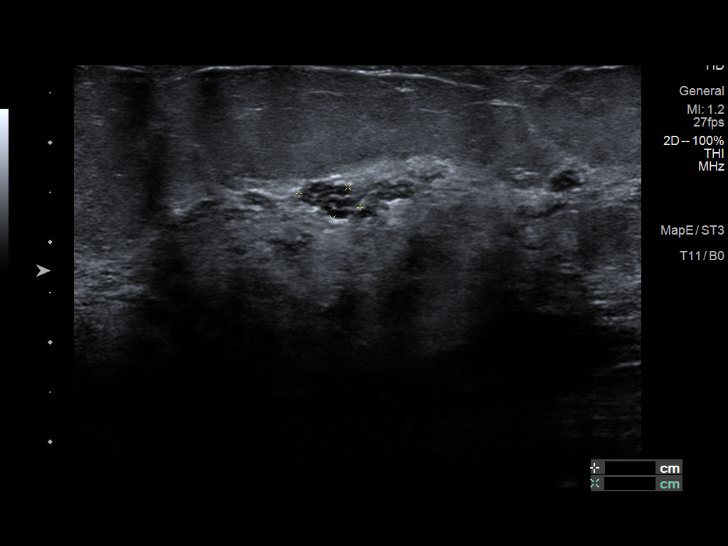
[im 3/15]
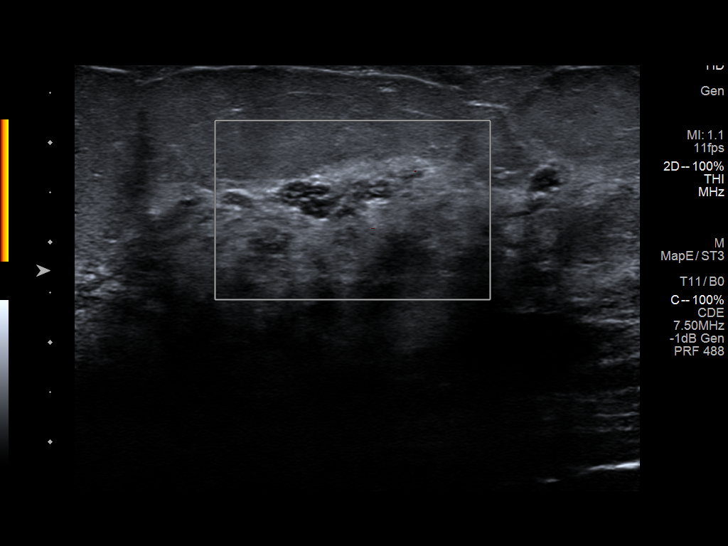
[im 5/15]
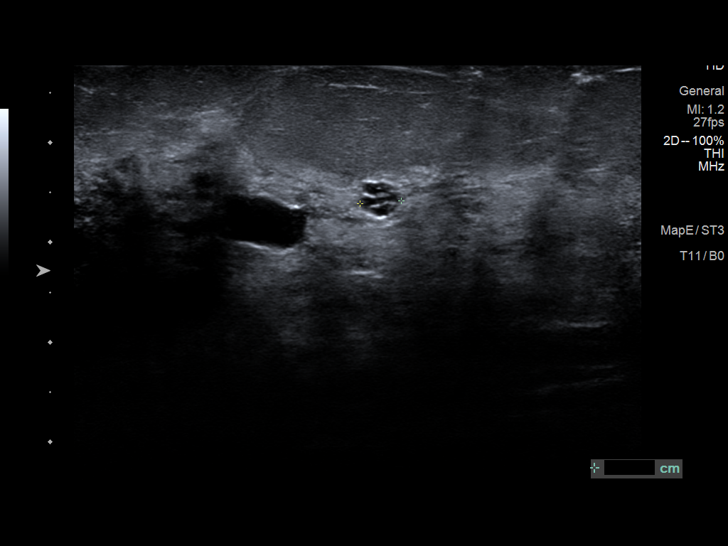
[im 6/15]
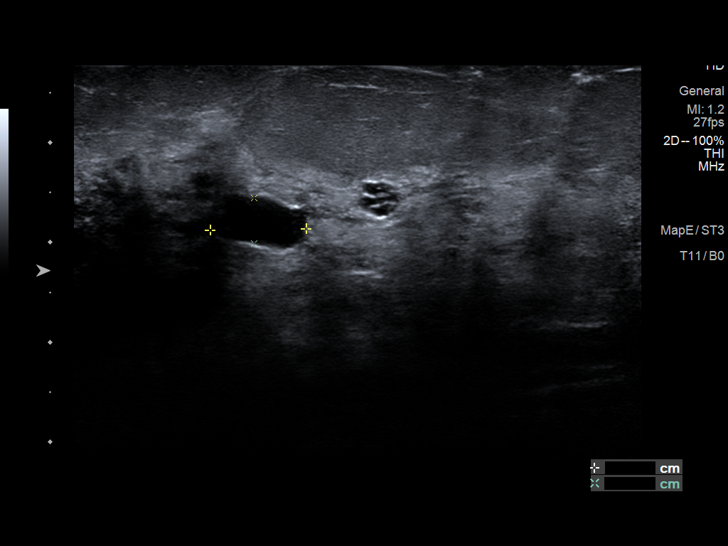
[im 7/15]
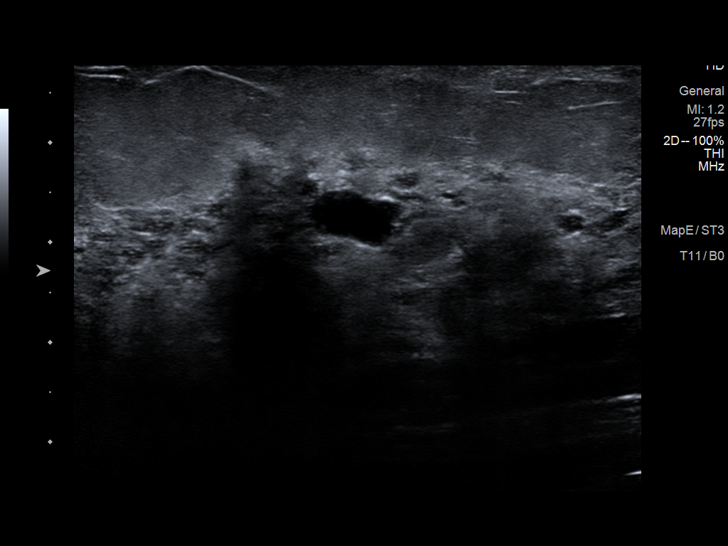
[im 8/15]
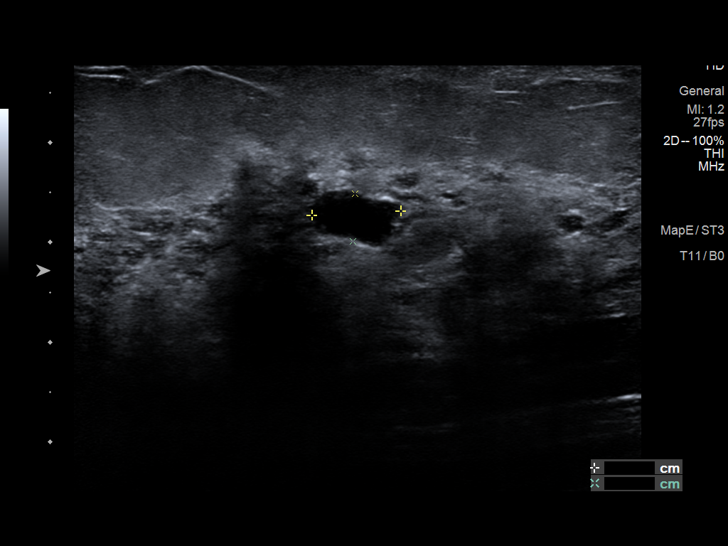
[im 9/15]
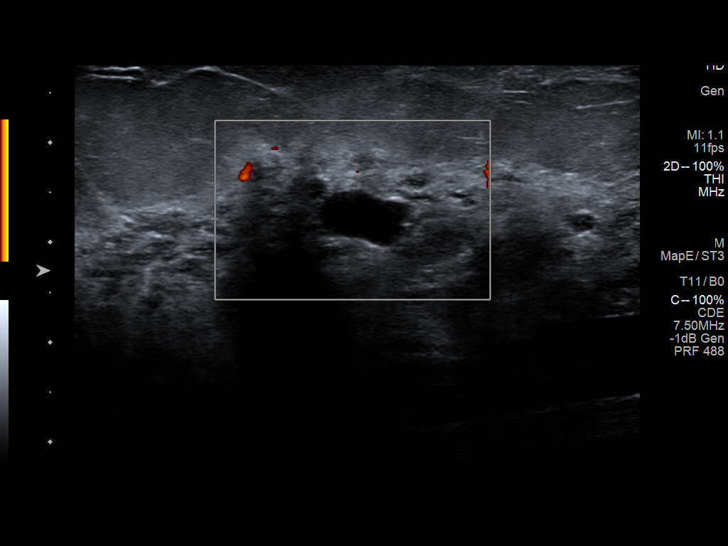
[im 10/15]
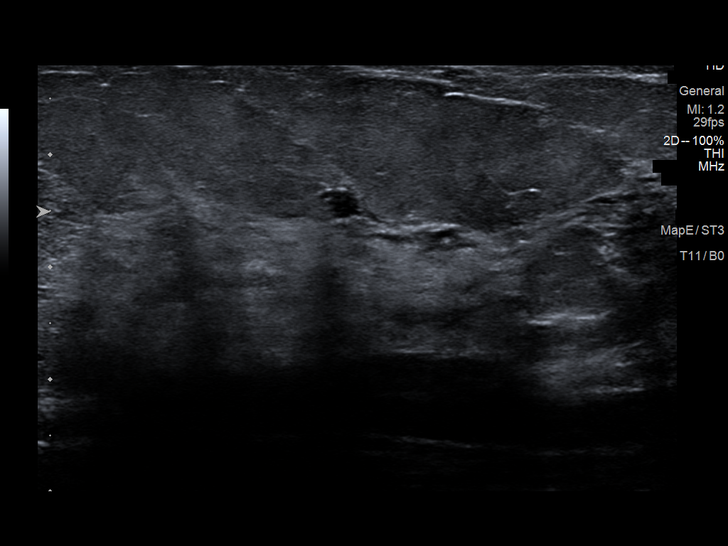
[im 11/15]
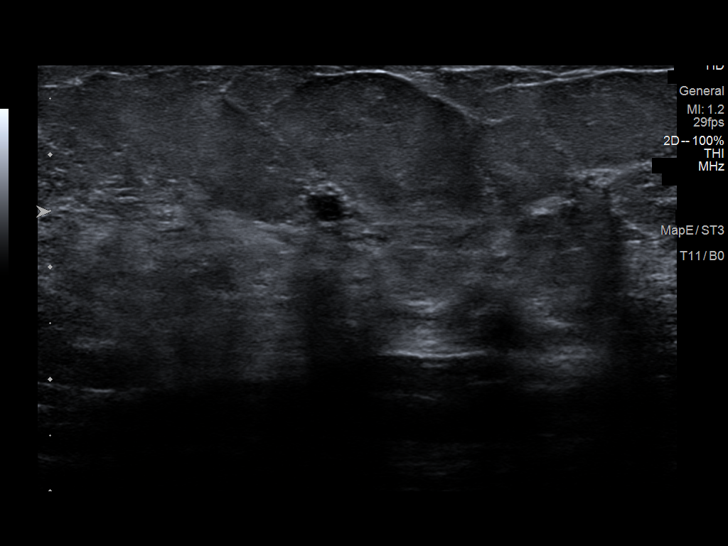
[im 13/15]
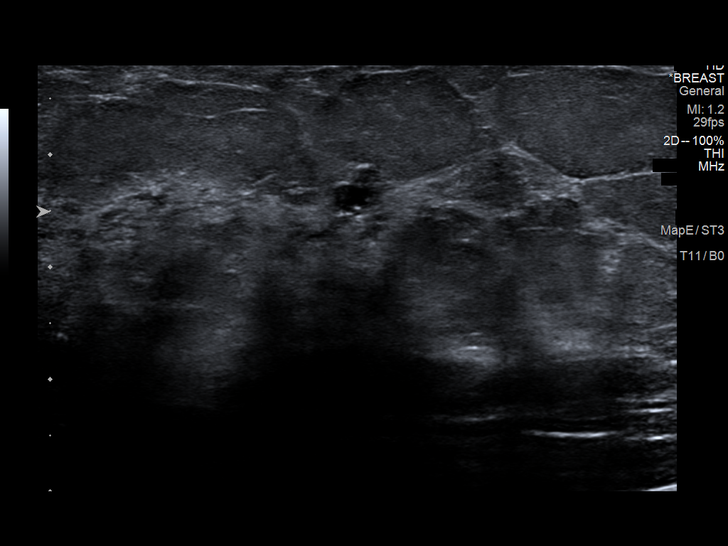
[im 14/15]
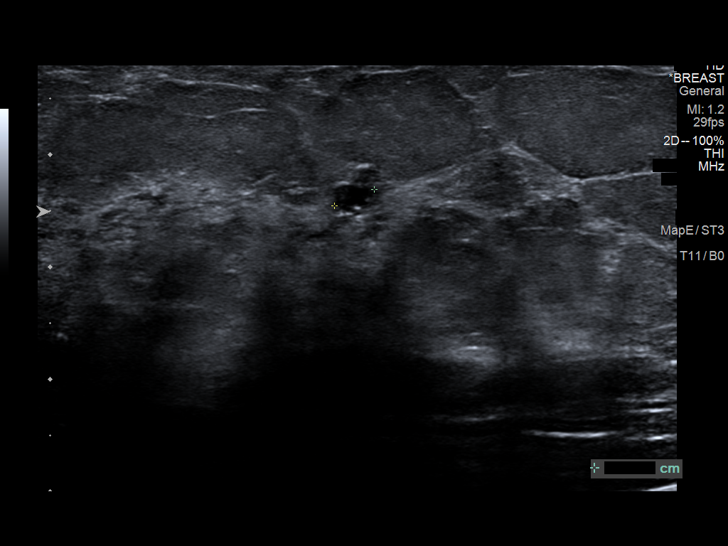
[im 15/15]
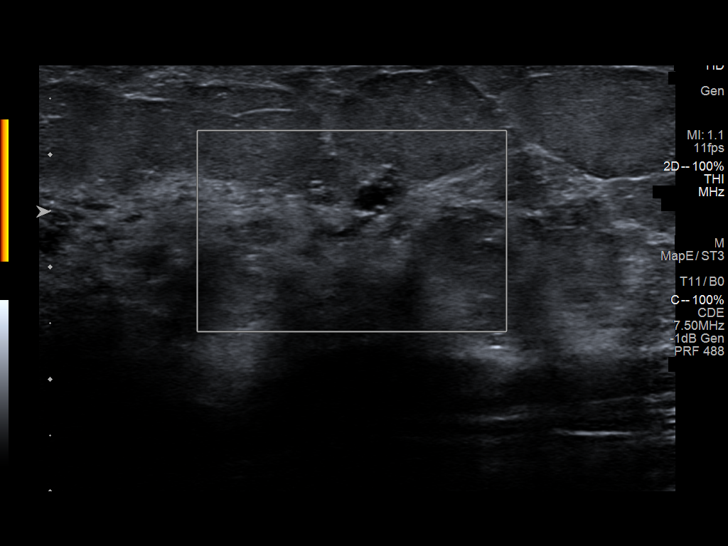

[13 of 15 positions shown; findings below may reference images not displayed]

ACR Breast Density Category c: The breast tissue is heterogeneously
dense, which may obscure small masses.
FINDINGS: Benign calcifications are seen bilaterally. There is a new mass in
the medial right breast. No other suspicious mammographic findings
are identified.

Mammographic images were processed with CAD.

On physical exam, no suspicious lumps are identified.

Targeted ultrasound is performed, showing fibrocystic changes in the
right breast medially correlating with the mammographically
identified mass. No suspicious findings are seen on the left to
correlate with palpable lumps felt by the patient and her physician.
IMPRESSION: No mammographic or sonographic evidence of malignancy. Fibrocystic
changes.

RECOMMENDATION:
Annual screening mammography. Treatment of the patient's symptoms
should be based on clinical and physical exam given lack of imaging
findings.

I have discussed the findings and recommendations with the patient.
If applicable, a reminder letter will be sent to the patient
regarding the next appointment.

BI-RADS CATEGORY  2: Benign.

## 2020-04-14 MED FILL — ARMOUR THYROID 60 MG TABLET: 60 | 90 days supply | Qty: 45 | Fill #2

## 2020-04-14 MED FILL — ARMOUR THYROID 90 MG TABLET: 90 | 90 days supply | Qty: 45 | Fill #2

## 2020-04-21 MED FILL — RIZATRIPTAN BENZOATE 10 MG: 10 | 30 days supply | Qty: 6 | Fill #0

## 2020-04-28 ENCOUNTER — Encounter: Payer: Self-pay | Admitting: Rheumatology

## 2020-04-28 NOTE — Telephone Encounter (Signed)
I returned patient's call and answered all her questions at length.  We will have further discussion when she comes in for follow-up visit.  Please forward the last office visit to her endocrinologist per her request.

## 2020-05-04 DIAGNOSIS — R609 Edema, unspecified: Secondary | ICD-10-CM | POA: Diagnosis not present

## 2020-05-04 DIAGNOSIS — M722 Plantar fascial fibromatosis: Secondary | ICD-10-CM | POA: Diagnosis not present

## 2020-05-10 DIAGNOSIS — Z20828 Contact with and (suspected) exposure to other viral communicable diseases: Secondary | ICD-10-CM | POA: Diagnosis not present

## 2020-05-11 MED FILL — FUROSEMIDE 20 MG TABS: 20 | 20 days supply | Qty: 10 | Fill #0

## 2020-05-26 ENCOUNTER — Other Ambulatory Visit: Payer: Self-pay

## 2020-05-26 DIAGNOSIS — M2550 Pain in unspecified joint: Secondary | ICD-10-CM | POA: Diagnosis not present

## 2020-05-30 NOTE — Progress Notes (Deleted)
Office Visit Note  Patient: Taylor Bennett             Date of Birth: [birth year]           MRN: [id number]             PCP: Lennie Odor, PA Referring: Lennie Odor, PA Visit Date: 06/08/2020 Occupation: @GUAROCC @  Subjective:  No chief complaint on file.   History of Present Illness: Taylor Bennett is a 39 y.o. female ***   Activities of Daily Living:  Patient reports morning stiffness for *** {minute/hour:19697}.   Patient {ACTIONS;DENIES/REPORTS:21021675::"Denies"} nocturnal pain.  Difficulty dressing/grooming: {ACTIONS;DENIES/REPORTS:21021675::"Denies"} Difficulty climbing stairs: {ACTIONS;DENIES/REPORTS:21021675::"Denies"} Difficulty getting out of chair: {ACTIONS;DENIES/REPORTS:21021675::"Denies"} Difficulty using hands for taps, buttons, cutlery, and/or writing: {ACTIONS;DENIES/REPORTS:21021675::"Denies"}  No Rheumatology ROS completed.   PMFS History:  Patient Active Problem List   Diagnosis Date Noted  . Paresthesia 11/30/2016  . Neck pain 10/23/2016  . Left cervical radiculopathy 10/23/2016  . Vitamin D deficiency 08/10/2016  . PCOS (polycystic ovarian syndrome) 06/16/2013  . Migraines 06/16/2013  . Hypothyroidism 06/10/2013    Past Medical History:  Diagnosis Date  . Back pain   . Cervical pain   . PCOS (polycystic ovarian syndrome)   . Scoliosis   . Thyroid disease     Family History  Problem Relation Age of Onset  . Thyroid disease Mother   . Arthritis Mother   . Diabetes Father   . Congestive Heart Failure Father   . Alcoholism Father   . Neuropathy Father   . Arthritis Father   . Osteoarthritis Maternal Aunt   . Rheum arthritis Paternal Grandmother   . Healthy Daughter   . Polycystic ovary syndrome Daughter    Past Surgical History:  Procedure Laterality Date  . ANKLE SURGERY  2009   ORIF   . THYROIDECTOMY    . WISDOM TOOTH EXTRACTION     Social History   Social History Narrative   Lives at home with her fiance and  children.   Right-handed.   1-2 cup caffeine daily.   Immunization History  Administered Date(s) Administered  . Influenza-Unspecified 06/03/2014, 06/20/2015     Objective: Vital Signs: There were no vitals taken for this visit.   Physical Exam   Musculoskeletal Exam: ***  CDAI Exam: CDAI Score: -- Patient Global: --; Provider Global: -- Swollen: --; Tender: -- Joint Exam 06/08/2020   No joint exam has been documented for this visit   There is currently no information documented on the homunculus. Go to the Rheumatology activity and complete the homunculus joint exam.  Investigation: No additional findings.  Imaging: No results found.  Recent Labs: Lab Results  Component Value Date   WBC 9.4 07/19/2015   HGB 13.4 07/19/2015   PLT 303 07/19/2015   NA 138 09/26/2018   K 4.1 09/26/2018   CL 104 09/26/2018   CO2 26 09/26/2018   GLUCOSE 80 09/26/2018   BUN 14 09/26/2018   CREATININE 0.68 09/26/2018   BILITOT 0.3 09/26/2018   ALKPHOS 58 07/19/2015   AST 14 09/26/2018   ALT 12 09/26/2018   PROT 6.7 09/26/2018   ALBUMIN 3.6 07/19/2015   CALCIUM 9.1 04/01/2020   GFRAA 128 09/26/2018    Speciality Comments: No specialty comments available.  Procedures:  No procedures performed Allergies: Patient has no known allergies.   Assessment / Plan:     Visit Diagnoses: No diagnosis found.  Orders: No orders of the defined types were placed in this encounter.  No orders of the defined types were placed in this encounter.   Face-to-face time spent with patient was *** minutes. Greater than 50% of time was spent in counseling and coordination of care.  Follow-Up Instructions: No follow-ups on file.   Trinadee Verhagen C Jayveon Convey, CMA  Note - This record has been created using Dragon software.  Chart creation errors have been sought, but may not always  have been located. Such creation errors do not reflect on  the standard of medical care. 

## 2020-06-04 LAB — SEDIMENTATION RATE: Sed Rate: 38 mm/h — ABNORMAL HIGH (ref 0–20)

## 2020-06-04 LAB — RHEUMATOID FACTOR: Rheumatoid fact SerPl-aCnc: <14 IU/mL (ref <14)

## 2020-06-04 LAB — CYCLIC CITRUL PEPTIDE ANTIBODY, IGG: Cyclic Citrullin Peptide Ab: <16 UNITS

## 2020-06-04 LAB — 14-3-3 ETA PROTEIN: 14-3-3 eta Protein: 7 ng/mL — ABNORMAL HIGH (ref <0.2)

## 2020-06-05 NOTE — Progress Notes (Signed)
I will discuss results at the fu appointment.

## 2020-06-06 ENCOUNTER — Ambulatory Visit: Payer: Self-pay

## 2020-06-06 ENCOUNTER — Other Ambulatory Visit: Payer: Self-pay

## 2020-06-06 ENCOUNTER — Encounter: Payer: Self-pay | Admitting: Rheumatology

## 2020-06-06 ENCOUNTER — Ambulatory Visit (INDEPENDENT_AMBULATORY_CARE_PROVIDER_SITE_OTHER): Payer: 59 | Admitting: Rheumatology

## 2020-06-06 VITALS — BP 125/85 | HR 71 | Resp 14 | Ht 63.0 in | Wt 198.0 lb

## 2020-06-06 DIAGNOSIS — M65841 Other synovitis and tenosynovitis, right hand: Secondary | ICD-10-CM

## 2020-06-06 DIAGNOSIS — M059 Rheumatoid arthritis with rheumatoid factor, unspecified: Secondary | ICD-10-CM | POA: Diagnosis not present

## 2020-06-06 DIAGNOSIS — Z8261 Family history of arthritis: Secondary | ICD-10-CM

## 2020-06-06 DIAGNOSIS — M4124 Other idiopathic scoliosis, thoracic region: Secondary | ICD-10-CM

## 2020-06-06 DIAGNOSIS — M19071 Primary osteoarthritis, right ankle and foot: Secondary | ICD-10-CM

## 2020-06-06 DIAGNOSIS — M51369 Other intervertebral disc degeneration, lumbar region without mention of lumbar back pain or lower extremity pain: Secondary | ICD-10-CM

## 2020-06-06 DIAGNOSIS — M5136 Other intervertebral disc degeneration, lumbar region: Secondary | ICD-10-CM

## 2020-06-06 DIAGNOSIS — Z8669 Personal history of other diseases of the nervous system and sense organs: Secondary | ICD-10-CM

## 2020-06-06 DIAGNOSIS — M19072 Primary osteoarthritis, left ankle and foot: Secondary | ICD-10-CM

## 2020-06-06 DIAGNOSIS — Z79899 Other long term (current) drug therapy: Secondary | ICD-10-CM

## 2020-06-06 DIAGNOSIS — G8929 Other chronic pain: Secondary | ICD-10-CM

## 2020-06-06 DIAGNOSIS — Z7189 Other specified counseling: Secondary | ICD-10-CM

## 2020-06-06 DIAGNOSIS — M5030 Other cervical disc degeneration, unspecified cervical region: Secondary | ICD-10-CM

## 2020-06-06 DIAGNOSIS — E559 Vitamin D deficiency, unspecified: Secondary | ICD-10-CM

## 2020-06-06 DIAGNOSIS — M19042 Primary osteoarthritis, left hand: Secondary | ICD-10-CM

## 2020-06-06 DIAGNOSIS — M65842 Other synovitis and tenosynovitis, left hand: Secondary | ICD-10-CM

## 2020-06-06 DIAGNOSIS — M19041 Primary osteoarthritis, right hand: Secondary | ICD-10-CM

## 2020-06-06 DIAGNOSIS — M2242 Chondromalacia patellae, left knee: Secondary | ICD-10-CM

## 2020-06-06 DIAGNOSIS — E282 Polycystic ovarian syndrome: Secondary | ICD-10-CM

## 2020-06-06 DIAGNOSIS — M7062 Trochanteric bursitis, left hip: Secondary | ICD-10-CM | POA: Diagnosis not present

## 2020-06-06 DIAGNOSIS — M533 Sacrococcygeal disorders, not elsewhere classified: Secondary | ICD-10-CM

## 2020-06-06 DIAGNOSIS — M7918 Myalgia, other site: Secondary | ICD-10-CM

## 2020-06-06 DIAGNOSIS — E890 Postprocedural hypothyroidism: Secondary | ICD-10-CM

## 2020-06-06 NOTE — Telephone Encounter (Signed)
Pending lab results, patient will be starting plaquenil per Dr. Estanislado Pandy. Thanks!

## 2020-06-06 NOTE — Progress Notes (Signed)
 Office Visit Note  Patient: Taylor Bennett             Date of Birth: 05/29/1981           MRN: [id number]             PCP: Redmon, Noelle, PA Referring: Redmon, Noelle, PA Visit Date: 06/06/2020 Occupation: @GUAROCC@  Subjective:  Pain in multiple joints.   History of Present Illness: Taylor Bennett is a 39 y.o. female with history of inflammatory arthritis.  She states she continues to have pain and swelling in her hands intermittently.  She also complains of pain and discomfort in her ankles and her feet.  She states her feet occasionally swell.  She has longstanding problems with lower back pain which is still persist.  She is also has some discomfort in her knee joints.  Activities of Daily Living:  Patient reports morning stiffness for several  hours.   Patient Reports nocturnal pain.  Difficulty dressing/grooming: Reports Difficulty climbing stairs: Reports Difficulty getting out of chair: Reports Difficulty using hands for taps, buttons, cutlery, and/or writing: Reports  Review of Systems  Constitutional: Positive for fatigue.  HENT: Positive for mouth dryness. Negative for mouth sores and nose dryness.   Eyes: Positive for dryness. Negative for itching.  Respiratory: Negative for shortness of breath and difficulty breathing.   Cardiovascular: Negative for chest pain and palpitations.  Gastrointestinal: Negative for blood in stool, constipation and diarrhea.  Endocrine: Negative for increased urination.  Genitourinary: Negative for difficulty urinating.  Musculoskeletal: Positive for arthralgias, joint pain, joint swelling, myalgias, morning stiffness, muscle tenderness and myalgias.  Skin: Positive for color change. Negative for rash and redness.  Allergic/Immunologic: Negative for susceptible to infections.  Neurological: Positive for headaches. Negative for dizziness, numbness, memory loss and weakness.  Hematological: Positive for bruising/bleeding tendency.    Psychiatric/Behavioral: Negative for confusion.    PMFS History:  Patient Active Problem List   Diagnosis Date Noted  . Paresthesia 11/30/2016  . Neck pain 10/23/2016  . Left cervical radiculopathy 10/23/2016  . Vitamin D deficiency 08/10/2016  . PCOS (polycystic ovarian syndrome) 06/16/2013  . Migraines 06/16/2013  . Hypothyroidism 06/10/2013    Past Medical History:  Diagnosis Date  . Back pain   . Cervical pain   . PCOS (polycystic ovarian syndrome)   . Scoliosis   . Thyroid disease     Family History  Problem Relation Age of Onset  . Thyroid disease Mother   . Arthritis Mother   . Diabetes Father   . Congestive Heart Failure Father   . Alcoholism Father   . Neuropathy Father   . Arthritis Father   . Osteoarthritis Maternal Aunt   . Rheum arthritis Paternal Grandmother   . Healthy Daughter   . Polycystic ovary syndrome Daughter    Past Surgical History:  Procedure Laterality Date  . ANKLE SURGERY  2009   ORIF   . THYROIDECTOMY    . WISDOM TOOTH EXTRACTION     Social History   Social History Narrative   Lives at home with her fiance and children.   Right-handed.   1-2 cup caffeine daily.   Immunization History  Administered Date(s) Administered  . Influenza-Unspecified 06/03/2014, 06/20/2015  . Moderna SARS-COVID-2 Vaccination 10/02/2019, 11/02/2019     Objective: Vital Signs: BP 125/85 (BP Location: Right Arm, Patient Position: Sitting, Cuff Size: Normal)   Pulse 71   Resp 14   Ht 5' 3" (1.6 m)   Wt 198   lb (89.8 kg)   BMI 35.07 kg/m    Physical Exam Vitals and nursing note reviewed.  Constitutional:      Appearance: She is well-developed.  HENT:     Head: Normocephalic and atraumatic.  Eyes:     Conjunctiva/sclera: Conjunctivae normal.  Cardiovascular:     Rate and Rhythm: Normal rate and regular rhythm.     Heart sounds: Normal heart sounds.  Pulmonary:     Effort: Pulmonary effort is normal.     Breath sounds: Normal breath sounds.   Abdominal:     General: Bowel sounds are normal.     Palpations: Abdomen is soft.  Musculoskeletal:     Cervical back: Normal range of motion.  Lymphadenopathy:     Cervical: No cervical adenopathy.  Skin:    General: Skin is warm and dry.     Capillary Refill: Capillary refill takes less than 2 seconds.  Neurological:     Mental Status: She is alert and oriented to person, place, and time.  Psychiatric:        Behavior: Behavior normal.      Musculoskeletal Exam: C-spine was in good range of motion.  She has painful range of motion of her lumbar spine.  Shoulder joints, elbow joints, wrist joints, MCPs PIPs and DIPs been good range of motion with no synovitis.  She has tenderness over wrist joints and some of her MCP and PIP joints.  Hip joints, knee joints, ankles, MTPs and PIPs with good range of motion with no synovitis.  CDAI Exam: CDAI Score: 5.9  Patient Global: 6 mm; Provider Global: 3 mm Swollen: 0 ; Tender: 5  Joint Exam 06/06/2020      Right  Left  Wrist   Tender     MCP 2   Tender   Tender  MCP 3   Tender   Tender     Investigation: No additional findings.  Imaging: [place] Extrem Up Bilat Comp  Result Date: 06/06/2020 Ultrasound examination of bilateral hands was performed per EULAR recommendations. Using 12 MHz transducer, grayscale and power Doppler bilateral second, third, and fifth MCP joints and bilateral wrist joints both dorsal and volar aspects were evaluated to look for synovitis or tenosynovitis. The findings were there was synovial thickening over bilateral second and third MCP joints.  Mild synovitis was noted over the left second MCP.  Mild synovitis was noted in the left wrist joint on ultrasound examination. Right median nerve was 0.10 cm squares which was within normal limits and left median nerve was 0.07 cm squares which was within normal limits. Impression: Ultrasound examination bilateral hands showed synovial thickening and mild synovitis.   Bilateral median nerves within normal limits.   Recent Labs: Lab Results  Component Value Date   WBC 9.4 07/19/2015   HGB 13.4 07/19/2015   PLT 303 07/19/2015   NA 138 09/26/2018   K 4.1 09/26/2018   CL 104 09/26/2018   CO2 26 09/26/2018   GLUCOSE 80 09/26/2018   BUN 14 09/26/2018   CREATININE 0.68 09/26/2018   BILITOT 0.3 09/26/2018   ALKPHOS 58 07/19/2015   AST 14 09/26/2018   ALT 12 09/26/2018   PROT 6.7 09/26/2018   ALBUMIN 3.6 07/19/2015   CALCIUM 9.1 04/01/2020   GFRAA 128 09/26/2018       November 30, 2019 anti-CCP negative, 14 33 eta 3.6+, ESR 34, HLA-B27 negative  09/21/19: ANA-, ESR 32, RF<10, CBC WNL, uric acid 5.3.   Speciality Comments: No specialty comments  available.  Procedures:  No procedures performed Allergies: Patient has no known allergies.   Assessment / Plan:     Visit Diagnoses: Seropositive rheumatoid arthritis (HCC) - +14 3 3 eta, elevated sedimentation rate in March 2021.  Patient continues to have pain and discomfort in her bilateral hands and bilateral feet.  She gives history of intermittent swelling in her hands, ankle joints and feet.  Ultrasound examination today showed some synovial thickening and mild synovitis.  We had detailed discussion regarding different treatment options and their side effects.  Indications side effects contraindications of Plaquenil were reviewed.  Handout was given and consent was taken.  We will call in prescription after the lab results are available.  Her dose will be 200 mg p.o. twice daily Monday to Friday.  Patient was counseled on the purpose, proper use, and adverse effects of hydroxychloroquine including nausea/diarrhea, skin rash, headaches, and sun sensitivity.  Discussed importance of annual eye exams while on hydroxychloroquine to monitor to ocular toxicity and discussed importance of frequent laboratory monitoring.  Provided patient with eye exam form for baseline ophthalmologic exam.  Provided patient  with educational materials on hydroxychloroquine and answered all questions.  Patient consented to hydroxychloroquine.  Will upload consent in the media tab.    Dose will be Plaquenil 200 mg twice daily Monday through Friday.  Prescription pending lab results.   High risk medication-we will check CBC, CMP and G6PD today we will check labs in a month, 3 months and then every 5 months to monitor for drug toxicity.  She will need baseline eye examination and then yearly eye examination.   Primary osteoarthritis of both hands - clinical and radiographic findings were consistent with early osteoarthritis. - Plan: US Extrem Up Bilat Comp.  Showed mild synovial thickening and synovitis.  Chondromalacia patellae, left knee-she continues to have discomfort in her knees.  Primary osteoarthritis of both feet-she has chronic feet pain.  Chronic SI joint pain-she has chronic discomfort in her lower back and SI joints.  Trochanteric bursitis, left hip-improved.  DDD (degenerative disc disease), cervical-chronic pain  Other idiopathic scoliosis, thoracic region-chronic pain  DDD (degenerative disc disease), lumbar-chronic pain  Myofascial pain-she has generalized pain, positive  tender points.  Need for regular exercise and stretching was discussed.  Family history of rheumatoid arthritis  PCOS (polycystic ovarian syndrome)  Hx of migraines  Vitamin D deficiency  Postoperative hypothyroidism  Educated about COVID-19 virus infection-she is fully vaccinated against COVID-19.  Have advised her to get to a booster when it is available to her.  Use of mask, social distancing and hand hygiene was discussed.  Orders: Orders Placed This Encounter  Procedures  . US Extrem Up Bilat Comp   No orders of the defined types were placed in this encounter.    Follow-Up Instructions: Return in about 6 weeks (around 07/18/2020) for Rheumatoid arthritis.   Shaili Deveshwar, MD  Note - This record has  been created using Dragon software.  Chart creation errors have been sought, but may not always  have been located. Such creation errors do not reflect on  the standard of medical care. 

## 2020-06-06 NOTE — Patient Instructions (Signed)
COVID-19 vaccine recommendations:   COVID-19 vaccine is recommended for everyone (unless you are allergic to a vaccine component), even if you are on a medication that suppresses your immune system.   If you are on Methotrexate, Cellcept (mycophenolate), Rinvoq, Morrie Sheldon, and Olumiant- hold the medication for 1 week after each vaccine. Hold Methotrexate for 2 weeks after the single dose COVID-19 vaccine.   If you are on Orencia subcutaneous injection - hold medication one week prior to and one week after the first COVID-19 vaccine dose (only).   If you are on Orencia IV infusions- time vaccination administration so that the first COVID-19 vaccination will occur four weeks after the infusion and postpone the subsequent infusion by one week.   If you are on Cyclophosphamide or Rituxan infusions please contact your doctor prior to receiving the COVID-19 vaccine.   Do not take Tylenol or any anti-inflammatory medications (NSAIDs) 24 hours prior to the COVID-19 vaccination.   There is no direct evidence about the efficacy of the COVID-19 vaccine in individuals who are on medications that suppress the immune system.   Even if you are fully vaccinated, and you are on any medications that suppress your immune system, please continue to wear a mask, maintain at least six feet social distance and practice hand hygiene.   If you develop a COVID-19 infection, please contact your PCP or our office to determine if you need antibody infusion.  The booster vaccine is now available for immunocompromised patients. It is advised that if you had Pfizer vaccine you should get [employer] booster.  If you had a Moderna vaccine then you should get a Moderna booster. Johnson and Wynetta Emery does not have a booster vaccine at this time.  Please see the following web sites for updated information.    https://www.[facility]heumatology.org/Portals/0/Files/COVID-19-Vaccination-Patient-Resources.pdf  https://www.rheumatology.org/About-Us/Newsroom/Press-Releases/ID/1159 Hydroxychloroquine tablets What is this medicine? HYDROXYCHLOROQUINE (hye drox ee KLOR oh kwin) is used to treat rheumatoid arthritis and systemic lupus erythematosus. It is also used to treat malaria. This medicine may be used for other purposes; ask your health care provider or pharmacist if you have questions. COMMON BRAND NAME(S): Plaquenil, Quineprox What should I tell my health care provider before I take this medicine? They need to know if you have any of these conditions:  diabetes  eye disease, vision problems  G6PD deficiency  heart disease  history of irregular heartbeat  if you often drink alcohol  kidney disease  liver disease  porphyria  psoriasis  an unusual or allergic reaction to chloroquine, hydroxychloroquine, other medicines, foods, dyes, or preservatives  pregnant or trying to get pregnant  breast-feeding How should I use this medicine? Take this medicine by mouth with a glass of water. Follow the directions on the prescription label. Do not cut, crush or chew this medicine. Swallow the tablets whole. Take this medicine with food. Avoid taking antacids within 4 hours of taking this medicine. It is best to separate these medicines by at least 4 hours. Take your medicine at regular intervals. Do not take it more often than directed. Take all of your medicine as directed even if you think you are better. Do not skip doses or stop your medicine early. Talk to your pediatrician regarding the use of this medicine in children. While this drug may be prescribed for selected conditions, precautions do apply. Overdosage: If you think you have taken too much of this medicine contact a poison control center or emergency room at once. NOTE: This medicine is only for you. Do not  share this medicine with  others. What if I miss a dose? If you miss a dose, take it as soon as you can. If it is almost time for your next dose, take only that dose. Do not take double or extra doses. What may interact with this medicine? Do not take this medicine with any of the following medications:  cisapride  dronedarone  pimozide  thioridazine This medicine may also interact with the following medications:  ampicillin  antacids  cimetidine  cyclosporine  digoxin  kaolin  medicines for diabetes, like insulin, glipizide, glyburide  medicines for seizures like carbamazepine, phenobarbital, phenytoin  mefloquine  methotrexate  other medicines that prolong the QT interval (cause an abnormal heart rhythm)  praziquantel This list may not describe all possible interactions. Give your health care provider a list of all the medicines, herbs, non-prescription drugs, or dietary supplements you use. Also tell them if you smoke, drink alcohol, or use illegal drugs. Some items may interact with your medicine. What should I watch for while using this medicine? Visit your health care professional for regular checks on your progress. Tell your health care professional if your symptoms do not start to get better or if they get worse. You may need blood work done while you are taking this medicine. If you take other medicines that can affect heart rhythm, you may need more testing. Talk to your health care professional if you have questions. Your vision may be tested before and during use of this medicine. Tell your health care professional right away if you have any change in your eyesight. What side effects may I notice from receiving this medicine? Side effects that you should report to your doctor or health care professional as soon as possible:  allergic reactions like skin rash, itching or hives, swelling of the face, lips, or tongue  changes in vision  decreased hearing or ringing of the  ears  muscle weakness  redness, blistering, peeling or loosening of the skin, including inside the mouth  sensitivity to light  signs and symptoms of a dangerous change in heartbeat or heart rhythm like chest pain; dizziness; fast or irregular heartbeat; palpitations; feeling faint or lightheaded, falls; breathing problems  signs and symptoms of liver injury like dark yellow or brown urine; general ill feeling or flu-like symptoms; light-colored stools; loss of appetite; nausea; right upper belly pain; unusually weak or tired; yellowing of the eyes or skin  signs and symptoms of low blood sugar such as feeling anxious; confusion; dizziness; increased hunger; unusually weak or tired; sweating; shakiness; cold; irritable; headache; blurred vision; fast heartbeat; loss of consciousness  suicidal thoughts  uncontrollable head, mouth, neck, arm, or leg movements Side effects that usually do not require medical attention (report to your doctor or health care professional if they continue or are bothersome):  diarrhea  dizziness  hair loss  headache  irritable  loss of appetite  nausea, vomiting  stomach pain This list may not describe all possible side effects. Call your doctor for medical advice about side effects. You may report side effects to FDA at 1-800-FDA-1088. Where should I keep my medicine? Keep out of the reach of children. Store at room temperature between 15 and 30 degrees C (59 and 86 degrees F). Protect from moisture and light. Throw away any unused medicine after the expiration date. NOTE: This sheet is a summary. It may not cover all possible information. If you have questions about this medicine, talk to your doctor, pharmacist,  or health care provider.  2020 Elsevier/Gold Standard (2019-01-05 12:56:32)  Standing Labs We placed an order today for your standing lab work.   Please have your standing labs drawn in 1 month and then every 3 months  If possible,  please have your labs drawn 2 weeks prior to your appointment so that the provider can discuss your results at your appointment.  We have open lab daily Monday through Thursday from 8:30-12:30 PM and 1:30-4:30 PM and Friday from 8:30-12:30 PM and 1:30-4:00 PM at the office of Dr. Bo Merino, [facility].   Please be advised, patients with office appointments requiring lab work will take precedents over walk-in lab work.  If possible, please come for your lab work on Monday and Friday afternoons, as you may experience shorter wait times. The office is located at [address] [place], [place], [place] [address] No appointment is necessary.   Labs are drawn by [facility]. Please bring your co-pay at the time of your lab draw.  You may receive a bill from [facility] for your lab work.  If you wish to have your labs drawn at another location, please call the office 24 hours in advance to send orders.  If you have any questions regarding directions or hours of operation,  please call [phone].   As a reminder, please drink plenty of water prior to coming for your lab work. Thanks!

## 2020-06-07 ENCOUNTER — Ambulatory Visit
Admission: RE | Admit: 2020-06-07 | Discharge: 2020-06-07 | Disposition: A | Discharge status: Home / Self Care | Payer: 59 | Source: Ambulatory Visit | Attending: Physician Assistant | Admitting: Physician Assistant

## 2020-06-07 ENCOUNTER — Other Ambulatory Visit: Payer: Self-pay

## 2020-06-07 ENCOUNTER — Other Ambulatory Visit: Payer: Self-pay | Admitting: Rheumatology

## 2020-06-07 VITALS — BP 147/86 | HR 85 | Temp 98.1°F | Resp 18

## 2020-06-07 DIAGNOSIS — R0982 Postnasal drip: Secondary | ICD-10-CM

## 2020-06-07 DIAGNOSIS — Z1152 Encounter for screening for COVID-19: Secondary | ICD-10-CM | POA: Diagnosis not present

## 2020-06-07 DIAGNOSIS — J3489 Other specified disorders of nose and nasal sinuses: Secondary | ICD-10-CM

## 2020-06-07 DIAGNOSIS — R059 Cough, unspecified: Secondary | ICD-10-CM

## 2020-06-07 LAB — CBC WITH DIFFERENTIAL/PLATELET
Absolute Monocytes: 1176 cells/uL — ABNORMAL HIGH (ref 200–950)
Basophils Absolute: 49 cells/uL (ref 0–200)
Basophils Relative: 0.7 %
Eosinophils Absolute: 189 cells/uL (ref 15–500)
Eosinophils Relative: 2.7 %
HCT: 39.9 % (ref 35.0–45.0)
Hemoglobin: 13.8 g/dL (ref 11.7–15.5)
Lymphs Abs: 2177 cells/uL (ref 850–3900)
MCH: 33.5 pg — ABNORMAL HIGH (ref 27.0–33.0)
MCHC: 34.6 g/dL (ref 32.0–36.0)
MCV: 96.8 fL (ref 80.0–100.0)
MPV: 9.3 fL (ref 7.5–12.5)
Monocytes Relative: 16.8 %
Neutro Abs: 3409 cells/uL (ref 1500–7800)
Neutrophils Relative %: 48.7 %
Platelets: 384 10*3/uL (ref 140–400)
RBC: 4.12 10*6/uL (ref 3.80–5.10)
RDW: 11.9 % (ref 11.0–15.0)
Total Lymphocyte: 31.1 %
WBC: 7 10*3/uL (ref 3.8–10.8)

## 2020-06-07 LAB — COMPLETE METABOLIC PANEL WITH GFR
AG Ratio: 1.7 (calc) (ref 1.0–2.5)
ALT: 12 U/L (ref 6–29)
AST: 16 U/L (ref 10–30)
Albumin: 4.2 g/dL (ref 3.6–5.1)
Alkaline phosphatase (APISO): 60 U/L (ref 31–125)
BUN: 13 mg/dL (ref 7–25)
CO2: 26 mmol/L (ref 20–32)
Calcium: 9.2 mg/dL (ref 8.6–10.2)
Chloride: 103 mmol/L (ref 98–110)
Creat: 0.72 mg/dL (ref 0.50–1.10)
GFR, Est African American: 121 mL/min/{1.73_m2} (ref >=60)
GFR, Est Non African American: 104 mL/min/{1.73_m2} (ref >=60)
Globulin: 2.5 g/dL (calc) (ref 1.9–3.7)
Glucose, Bld: 87 mg/dL (ref 65–99)
Potassium: 4.4 mmol/L (ref 3.5–5.3)
Sodium: 138 mmol/L (ref 135–146)
Total Bilirubin: 0.3 mg/dL (ref 0.2–1.2)
Total Protein: 6.7 g/dL (ref 6.1–8.1)

## 2020-06-07 LAB — GLUCOSE 6 PHOSPHATE DEHYDROGENASE: G-6PDH: 13.1 U/g Hgb (ref 7.0–20.5)

## 2020-06-07 MED ORDER — BENZONATATE 200 MG PO CAPS
200.0000 mg | ORAL_CAPSULE | Freq: Three times a day (TID) | ORAL | 0 refills | Status: DC
Start: 2020-06-07 — End: 2020-07-27

## 2020-06-07 MED ORDER — HYDROXYCHLOROQUINE SULFATE 200 MG PO TABS
ORAL_TABLET | ORAL | 0 refills | Status: DC
Start: 2020-06-07 — End: 2020-08-17

## 2020-06-07 MED ORDER — IPRATROPIUM BROMIDE 0.06 % NA SOLN
2.0000 | Freq: Four times a day (QID) | NASAL | 0 refills | Status: DC
Start: 2020-06-07 — End: 2020-07-27

## 2020-06-07 MED ORDER — FLUTICASONE PROPIONATE 50 MCG/ACT NA SUSP
2.0000 | Freq: Every day | NASAL | 0 refills | Status: DC
Start: 2020-06-07 — End: 2020-07-27

## 2020-06-07 MED FILL — HYDROXYCHLOROQUINE SULFATE: 200 | 30 days supply | Qty: 40 | Fill #0

## 2020-06-07 NOTE — Telephone Encounter (Signed)
CMP WNL. Absolute monocytes are mildly elevated. Rest of CBC WNL.  G6PD still pending.  Per office note on 06/06/2020:  200 mg p.o. twice daily Monday to Friday.

## 2020-06-07 NOTE — ED Triage Notes (Signed)
Pt c/o scratchy throat, hoarse, cough, runny nose, sinus pressure, and fatigue since Thursday. Pt c/o chest soreness from coughing.

## 2020-06-07 NOTE — Discharge Instructions (Signed)
COVID PCR testing ordered. I would like you to quarantine until testing results. Tessalon for cough. Start flonase, atrovent nasal spray for nasal congestion/drainage. You can use over the counter nasal saline rinse such as neti pot for nasal congestion. Keep hydrated, your urine should be clear to pale yellow in color. Tylenol/motrin for fever and pain. If experiencing shortness of breath, trouble breathing, go to the emergency department for further evaluation needed.

## 2020-06-07 NOTE — ED Provider Notes (Signed)
[facility]    CSN: [id number] Arrival date & time: 06/07/20  1745      History   Chief Complaint Chief Complaint  Patient presents with  . appt 6-cough    HPI Taylor Bennett is a 39 y.o. female.   39 year old female comes in for 5 day of URI symptoms. Throat irritation, cough, post nasal drip, rhinorrhea, sinus pressure, fatigue. Denies fever, chills, body aches. Denies abdominal pain, nausea, vomiting, diarrhea. Denies shortness of breath, loss of taste/smell. Former smoker. otc cold medicine without relief.      Past Medical History:  Diagnosis Date  . Back pain   . Cervical pain   . PCOS (polycystic ovarian syndrome)   . Scoliosis   . Thyroid disease     Patient Active Problem List   Diagnosis Date Noted  . Paresthesia 11/30/2016  . Neck pain 10/23/2016  . Left cervical radiculopathy 10/23/2016  . Vitamin D deficiency 08/10/2016  . PCOS (polycystic ovarian syndrome) 06/16/2013  . Migraines 06/16/2013  . Hypothyroidism 06/10/2013    Past Surgical History:  Procedure Laterality Date  . ANKLE SURGERY  2009   ORIF   . THYROIDECTOMY    . WISDOM TOOTH EXTRACTION      OB History   No obstetric history on file.      Home Medications    Prior to Admission medications   Medication Sig Start Date End Date Taking? Authorizing Provider  ARMOUR THYROID 60 MG tablet Take 1 tablet (60 mg total) by mouth every other day. 10/12/19   Philemon Kingdom, MD  ARMOUR THYROID 90 MG tablet Take 1 tablet (90 mg total) by mouth every other day. 10/12/19   Philemon Kingdom, MD  Ascorbic Acid (VITAMIN C PO) Take by mouth daily.    [provider]  benzonatate (TESSALON) 200 MG capsule Take 1 capsule (200 mg total) by mouth every 8 (eight) hours. 06/07/20   Ok Edwards, PA-C  Cholecalciferol (VITAMIN D PO) Take 5,000 Units by mouth 2 (two) times daily.     [provider]  fluticasone (FLONASE) 50 MCG/ACT nasal spray Place 2 sprays into both  nostrils daily. 06/07/20   Ok Edwards, PA-C  hydroxychloroquine (PLAQUENIL) 200 MG tablet Take 1 tablet p.o. twice daily Monday to Friday. 06/07/20   Bo Merino, MD  ibuprofen (ADVIL,MOTRIN) 200 MG tablet Take 200 mg by mouth every 6 (six) hours as needed. Takes 600mg  - 800mg  daily, as needed.    [provider]  ipratropium (ATROVENT) 0.06 % nasal spray Place 2 sprays into both nostrils 4 (four) times daily. 06/07/20   Tasia Catchings, Elif Yonts V, PA-C  loratadine (CLARITIN) 10 MG tablet Take 10 mg by mouth daily.    [provider]  meclizine (ANTIVERT) 25 MG tablet Take 1 tablet (25 mg total) by mouth 3 (three) times daily as needed for dizziness. 07/19/15   Harvel Quale, MD  Multiple Vitamin (MULTIVITAMIN PO) Take by mouth daily.    [provider]  ondansetron (ZOFRAN ODT) 4 MG disintegrating tablet Take 1 tablet (4 mg total) by mouth every 8 (eight) hours as needed for nausea or vomiting. 03/17/20   Hall-Potvin, [place], PA-C  rizatriptan (MAXALT) 10 MG tablet Take 10 mg by mouth as needed for migraine. May repeat in 2 hours if needed    [provider]  ZINC CITRATE-PHYTASE PO Take by mouth.    [provider]    Family History Family History  Problem Relation  Age of Onset  . Thyroid disease Mother   . Arthritis Mother   . Diabetes Father   . Congestive Heart Failure Father   . Alcoholism Father   . Neuropathy Father   . Arthritis Father   . Osteoarthritis Maternal Aunt   . Rheum arthritis Paternal Grandmother   . Healthy Daughter   . Polycystic ovary syndrome Daughter     Social History Social History   Tobacco Use  . Smoking status: Former Smoker    Start date: 10/13/2011  . Smokeless tobacco: Never Used  Vaping Use  . Vaping Use: Never used  Substance Use Topics  . Alcohol use: Yes    Comment: occ  . Drug use: No     Allergies   Patient has no known allergies.   Review of Systems Review of Systems  Reason unable to perform  ROS: See HPI as above.     Physical Exam Triage Vital Signs ED Triage Vitals  Enc Vitals Group     BP 06/07/20 1840 (!) 147/86     Pulse Rate 06/07/20 1840 85     Resp 06/07/20 1840 18     Temp 06/07/20 1840 98.1 F (36.7 C)     Temp Source 06/07/20 1840 Oral     SpO2 06/07/20 1840 97 %     Weight --      Height --      Head Circumference --      Peak Flow --      Pain Score 06/07/20 1841 0     Pain Loc --      Pain Edu? --      Excl. in [place]? --    No data found.  Updated Vital Signs BP (!) 147/86 (BP Location: Left Arm)   Pulse 85   Temp 98.1 F (36.7 C) (Oral)   Resp 18   LMP 05/24/2020   SpO2 97%   Physical Exam Constitutional:      General: She is not in acute distress.    Appearance: Normal appearance. She is well-developed. She is not ill-appearing, toxic-appearing or diaphoretic.  HENT:     Head: Normocephalic and atraumatic.     Right Ear: Ear canal and external ear normal. A middle ear effusion is present. Tympanic membrane is not erythematous or bulging.     Left Ear: Ear canal and external ear normal. A middle ear effusion is present. Tympanic membrane is not erythematous or bulging.     Nose:     Right Sinus: Maxillary sinus tenderness present. No frontal sinus tenderness.     Left Sinus: Maxillary sinus tenderness present. No frontal sinus tenderness.     Mouth/Throat:     Mouth: Mucous membranes are moist.     Pharynx: Oropharynx is clear. Uvula midline.  Eyes:     Conjunctiva/sclera: Conjunctivae normal.     Pupils: Pupils are equal, round, and reactive to light.  Cardiovascular:     Rate and Rhythm: Normal rate and regular rhythm.  Pulmonary:     Effort: Pulmonary effort is normal. No accessory muscle usage, prolonged expiration, respiratory distress or retractions.     Breath sounds: No decreased air movement or transmitted upper airway sounds. No decreased breath sounds.     Comments: LCTAB Musculoskeletal:     Cervical back: Normal range of  motion and neck supple.  Skin:    General: Skin is warm and dry.  Neurological:     Mental Status: She is alert and oriented  to person, place, and time.      UC Treatments / Results  Labs (all labs ordered are listed, but only abnormal results are displayed) Labs Reviewed  NOVEL CORONAVIRUS, NAA    EKG   Radiology   Procedures Procedures (including critical care time)  Medications Ordered in UC Medications - No data to display  Initial Impression / Assessment and Plan / UC Course  I have reviewed the triage vital signs and the nursing notes.  Pertinent labs & imaging results that were available during my care of the patient were reviewed by me and considered in my medical decision making (see chart for details).    COVID PCR test ordered. Patient to quarantine until testing results return. No alarming signs on exam. LCTAB. Symptomatic treatment discussed.  Push fluids.  Return precautions given.  Patient expresses understanding and agrees to plan.  Final Clinical Impressions(s) / UC Diagnoses   Final diagnoses:  Encounter for screening for COVID-19  Cough  Post-nasal drip  Sinus pressure    ED Prescriptions    Medication Sig Dispense Auth. Provider   fluticasone (FLONASE) 50 MCG/ACT nasal spray Place 2 sprays into both nostrils daily. 1 g Ozell Juhasz V, PA-C   ipratropium (ATROVENT) 0.06 % nasal spray Place 2 sprays into both nostrils 4 (four) times daily. 15 mL Aerial Dilley V, PA-C   benzonatate (TESSALON) 200 MG capsule Take 1 capsule (200 mg total) by mouth every 8 (eight) hours. 21 capsule Ok Edwards, PA-C     PDMP not reviewed this encounter.   Ok Edwards, PA-C 06/07/20 1908

## 2020-06-08 ENCOUNTER — Other Ambulatory Visit: Payer: 59 | Admitting: Rheumatology

## 2020-06-08 MED FILL — FLUTICASONE PROP 50 MCG SPR: 50 | 30 days supply | Qty: 16 | Fill #0

## 2020-06-08 MED FILL — IPRATROPIUM 0.06% SPRAY: 0.06 | 10 days supply | Qty: 15 | Fill #0

## 2020-06-08 MED FILL — BENZONATATE 200 MG CAPS: 200 | 7 days supply | Qty: 21 | Fill #0

## 2020-06-09 LAB — NOVEL CORONAVIRUS, NAA: SARS-CoV-2, NAA: NOT DETECTED

## 2020-06-09 LAB — SARS-COV-2, NAA 2 DAY TAT

## 2020-07-01 DIAGNOSIS — Z20822 Contact with and (suspected) exposure to covid-19: Secondary | ICD-10-CM | POA: Diagnosis not present

## 2020-07-05 ENCOUNTER — Telehealth: Payer: Self-pay | Admitting: Specialist

## 2020-07-05 NOTE — Telephone Encounter (Signed)
Patient called needing an appointment with Dr Louanne Skye for lower back pain. Patient saw Dr Louanne Skye in 2018    The number to contact patient is [phone]

## 2020-07-05 NOTE — Telephone Encounter (Signed)
Since it has been greater than 57yrs since her last OV, she needs to go into a new patient time slot @ 10:30 so where

## 2020-07-06 ENCOUNTER — Ambulatory Visit: Payer: 59 | Admitting: Surgery

## 2020-07-06 ENCOUNTER — Telehealth: Payer: Self-pay | Admitting: Specialist

## 2020-07-06 ENCOUNTER — Ambulatory Visit: Payer: Self-pay

## 2020-07-06 DIAGNOSIS — M25572 Pain in left ankle and joints of left foot: Secondary | ICD-10-CM

## 2020-07-06 MED FILL — ARMOUR THYROID 60 MG TABLET: 60 | 90 days supply | Qty: 45 | Fill #3

## 2020-07-06 MED FILL — ARMOUR THYROID 90 MG TABLET: 90 | 90 days supply | Qty: 45 | Fill #3

## 2020-07-06 NOTE — Telephone Encounter (Signed)
Called patient left message to return call to schedule an appointment with Dr Louanne Skye for lower back pain

## 2020-07-07 NOTE — Progress Notes (Signed)
Office Visit Note   Patient: Taylor Bennett           Date of Birth: 1981-08-03           MRN: [id number] Visit Date: 07/06/2020              Requested by: Lennie Odor, PA [address] [place],  [place] [address] PCP: Lennie Odor, PA   Assessment & Plan: Visit Diagnoses:  1. Pain in left ankle and joints of left foot     Plan: Patient advised that left ankle pain may be related to synovitis or posttraumatic DJD from her ORIF medial malleolus fracture.  We will get this some time to see how she does.  She has an appointment scheduled with Dr. Louanne Skye early December 2021.  If this continues to be a problem he may consider trying an intra-articular Marcaine/Depo-Medrol injection.  I advised patient that if she would like to have this done sooner she can schedule an appointment with me.  All questions answered.  Follow-Up Instructions: No follow-ups on file.   Orders:  Orders Placed This Encounter  Procedures  . XR Ankle Complete Left   No orders of the defined types were placed in this encounter.     Procedures: No procedures performed   Clinical Data: No additional findings.   Subjective: Chief Complaint  Patient presents with  . Left Ankle - Pain    HPI 39 year old female comes in today with complaints of left ankle pain.  Ankle pain more medial and anterior.  She is status post ORIF medial malleolar fracture with a single screw in 2009.  Procedure was done in [place].  States that she has pain when she is weightbearing.  States that she is at her heaviest weight now and is wondering if this has something to do with it.  Pain also when she is getting in and out of her truck.  Describes some feeling of catching symptoms. Review of Systems No current cardiac pulmonary GI GU issues  Objective: Vital Signs: There were no vitals taken for this visit.  Physical Exam HENT:     Head: Normocephalic.  Eyes:     Extraocular Movements: Extraocular  movements intact.     Pupils: Pupils are equal, round, and reactive to light.  Pulmonary:     Effort: No respiratory distress.  Musculoskeletal:     Comments: Gait is minimally antalgic.  Left ankle good range of motion.  She has some tenderness across the anterior tibiotalar joint line.  Also some tenderness over the medial malleolus around where the screw head is.  Ankle ligaments are stable.  Neurological:     General: No focal deficit present.     Mental Status: She is alert and oriented to person, place, and time.     Ortho Exam  Specialty Comments:  No specialty comments available.  Imaging: No results found.   PMFS History: Patient Active Problem List   Diagnosis Date Noted  . Paresthesia 11/30/2016  . Neck pain 10/23/2016  . Left cervical radiculopathy 10/23/2016  . Vitamin D deficiency 08/10/2016  . PCOS (polycystic ovarian syndrome) 06/16/2013  . Migraines 06/16/2013  . Hypothyroidism 06/10/2013   Past Medical History:  Diagnosis Date  . Back pain   . Cervical pain   . PCOS (polycystic ovarian syndrome)   . Scoliosis   . Thyroid disease     Family History  Problem Relation Age of Onset  . Thyroid disease Mother   .  Arthritis Mother   . Diabetes Father   . Congestive Heart Failure Father   . Alcoholism Father   . Neuropathy Father   . Arthritis Father   . Osteoarthritis Maternal Aunt   . Rheum arthritis Paternal Grandmother   . Healthy Daughter   . Polycystic ovary syndrome Daughter     Past Surgical History:  Procedure Laterality Date  . ANKLE SURGERY  2009   ORIF   . THYROIDECTOMY    . WISDOM TOOTH EXTRACTION     Social History   Occupational History  . Occupation: ED - Admissions  Tobacco Use  . Smoking status: Former Smoker    Start date: 10/13/2011  . Smokeless tobacco: Never Used  Vaping Use  . Vaping Use: Never used  Substance and Sexual Activity  . Alcohol use: Yes    Comment: occ  . Drug use: No  . Sexual activity: Not on file

## 2020-07-13 NOTE — Progress Notes (Signed)
Office Visit Note  Patient: Taylor Bennett             Date of Birth: [birth year]           MRN: [id number]             PCP: Lennie Odor, PA Referring: Lennie Odor, PA Visit Date: 07/27/2020 Occupation: @GUAROCC @  Subjective:  fatigue.   History of Present Illness: Taylor Bennett is a 39 y.o. female with history of seropositive rheumatoid arthritis, osteoarthritis, degenerative disc disease and fibromyalgia syndrome.  She states she continues to have generalized pain and discomfort from fibromyalgia.  She has been taking hydroxychloroquine on a regular basis.  She has been taking hydroxychloroquine for about 30 days now.  She has not noticed much improvement in her symptoms.  She states she has been experiencing some fatigue and dizziness.  She states she recently had some discomfort in her left wrist and her left ankle joint.  She continues to have discomfort in her joints from underlying osteoarthritis.  She has discomfort in her entire spine.  She also has nocturnal pain due to lower back pain.  Activities of Daily Living:  Patient reports morning stiffness for all day.  Patient Reports nocturnal pain.  Difficulty dressing/grooming: Reports Difficulty climbing stairs: Reports Difficulty getting out of chair: Reports Difficulty using hands for taps, buttons, cutlery, and/or writing: Reports  Review of Systems  Constitutional: Positive for fatigue.  HENT: Positive for mouth dryness. Negative for mouth sores and nose dryness.   Eyes: Negative for pain, itching and dryness.  Respiratory: Negative for shortness of breath and difficulty breathing.   Cardiovascular: Positive for palpitations. Negative for chest pain.  Gastrointestinal: Negative for blood in stool, constipation and diarrhea.  Endocrine: Negative for increased urination.  Genitourinary: Negative for difficulty urinating.  Musculoskeletal: Positive for arthralgias, joint pain, joint swelling, myalgias, muscle  weakness, morning stiffness, muscle tenderness and myalgias.  Skin: Negative for color change, rash and redness.  Allergic/Immunologic: Negative for susceptible to infections.  Neurological: Positive for dizziness and memory loss. Negative for numbness, headaches and weakness.  Hematological: Negative for bruising/bleeding tendency.  Psychiatric/Behavioral: Negative for confusion.    PMFS History:  Patient Active Problem List   Diagnosis Date Noted  . Paresthesia 11/30/2016  . Neck pain 10/23/2016  . Left cervical radiculopathy 10/23/2016  . Vitamin D deficiency 08/10/2016  . PCOS (polycystic ovarian syndrome) 06/16/2013  . Migraines 06/16/2013  . Hypothyroidism 06/10/2013    Past Medical History:  Diagnosis Date  . Back pain   . Cervical pain   . PCOS (polycystic ovarian syndrome)   . Scoliosis   . Thyroid disease     Family History  Problem Relation Age of Onset  . Thyroid disease Mother   . Arthritis Mother   . Diabetes Father   . Congestive Heart Failure Father   . Alcoholism Father   . Neuropathy Father   . Arthritis Father   . Osteoarthritis Maternal Aunt   . Rheum arthritis Paternal Grandmother   . Healthy Daughter   . Polycystic ovary syndrome Daughter    Past Surgical History:  Procedure Laterality Date  . ANKLE SURGERY  2009   ORIF   . THYROIDECTOMY    . WISDOM TOOTH EXTRACTION     Social History   Social History Narrative   Lives at home with her fiance and children.   Right-handed.   1-2 cup caffeine daily.   Immunization History  Administered Date(s) Administered  .  Influenza-Unspecified 06/03/2014, 06/20/2015  . Moderna SARS-COVID-2 Vaccination 10/02/2019, 11/02/2019, 07/09/2020     Objective: Vital Signs: BP 110/77 (BP Location: Left Arm, Patient Position: Sitting, Cuff Size: Normal)   Pulse 84   Resp 15   Ht 5\' 3"  (1.6 m)   Wt 200 lb 6.4 oz (90.9 kg)   BMI 35.50 kg/m    Physical Exam Vitals and nursing note reviewed.    Constitutional:      Appearance: She is well-developed.  HENT:     Head: Normocephalic and atraumatic.  Eyes:     Conjunctiva/sclera: Conjunctivae normal.  Cardiovascular:     Rate and Rhythm: Normal rate and regular rhythm.     Heart sounds: Normal heart sounds.  Pulmonary:     Effort: Pulmonary effort is normal.     Breath sounds: Normal breath sounds.  Abdominal:     General: Bowel sounds are normal.     Palpations: Abdomen is soft.  Musculoskeletal:     Cervical back: Normal range of motion.  Lymphadenopathy:     Cervical: No cervical adenopathy.  Skin:    General: Skin is warm and dry.     Capillary Refill: Capillary refill takes less than 2 seconds.  Neurological:     Mental Status: She is alert and oriented to person, place, and time.  Psychiatric:        Behavior: Behavior normal.      Musculoskeletal Exam: C-spine was in good range of motion.  She has some discomfort range of motion of her lumbar spine.  Shoulder joints, elbow joints, wrist joints, MCPs PIPs and DIPs with good range of motion with no synovitis.  Hip joints, knee joints, ankles, MTPs and PIPs with good range of motion with no synovitis.  CDAI Exam: CDAI Score: 0.6  Patient Global: 4 mm; Provider Global: 2 mm Swollen: 0 ; Tender: 0  Joint Exam 07/27/2020   No joint exam has been documented for this visit   There is currently no information documented on the homunculus. Go to the Rheumatology activity and complete the homunculus joint exam.  Investigation: No additional findings.  Imaging: No results found.  Recent Labs: Lab Results  Component Value Date   WBC 7.0 06/06/2020   HGB 13.8 06/06/2020   PLT 384 06/06/2020   NA 138 06/06/2020   K 4.4 06/06/2020   CL 103 06/06/2020   CO2 26 06/06/2020   GLUCOSE 87 06/06/2020   BUN 13 06/06/2020   CREATININE 0.72 06/06/2020   BILITOT 0.3 06/06/2020   ALKPHOS 58 07/19/2015   AST 16 06/06/2020   ALT 12 06/06/2020   PROT 6.7 06/06/2020    ALBUMIN 3.6 07/19/2015   CALCIUM 9.2 06/06/2020   GFRAA 121 06/06/2020    Speciality Comments: No specialty comments available.  Procedures:  No procedures performed Allergies: Patient has no known allergies.   Assessment / Plan:     Visit Diagnoses: Seropositive rheumatoid arthritis ([place]) - +14 3 3  eta, elevated sedimentation rate in March 2021.  He has synovitis on ultrasound examination.  She has been on Plaquenil for a month now.  She has not noticed much improvement.  She states she has been experiencing some fatigue and dizziness.  She is not sure if is due to allergies are due to the medication.  I do not see any synovitis on my examination today.  High risk medication use - Plaquenil 200 mg twice daily Monday through Friday. - Plan: CBC with Differential/Platelet, COMPLETE METABOLIC PANEL WITH GFR  today and then in 63months.  Primary osteoarthritis of both hands - clinical and radiographic findings were consistent with early osteoarthritis.  Joint protection muscle strengthening was discussed.-  Chondromalacia patellae, left knee- she has intermittent discomfort.    Primary osteoarthritis of both feet-she has some discomfort but no synovitis was noted.  She had previous surgery on her left ankle joint.  She gives history of intermittent swelling in her ankles.  Chronic SI joint pain-she had no SI joint tenderness today.  DDD (degenerative disc disease), cervical-she has some limitation with range of motion.  Other idiopathic scoliosis, thoracic region-chronic pain  DDD (degenerative disc disease), lumbar-chronic pain.  Followed by Dr. Louanne Skye.  Myofascial pain-she continues to have some generalized pain and discomfort.  Family history of rheumatoid arthritis  PCOS (polycystic ovarian syndrome)  Vitamin D deficiency  Postoperative hypothyroidism  Hx of migraines  BMI 35.50-patient states that she has gained some weight.  Weight loss diet and exercise was discussed at  length.  Association of heart disease with rheumatoid arthritis was also discussed.  I have given her a handout on weight management clinic.  Orders: Orders Placed This Encounter  Procedures  . CBC with Differential/Platelet  . COMPLETE METABOLIC PANEL WITH GFR   No orders of the defined types were placed in this encounter.    Follow-Up Instructions: Return in about 3 months (around 10/27/2020) for Rheumatoid arthritis, Osteoarthritis.   Bo Merino, MD  Note - This record has been created using Editor, commissioning.  Chart creation errors have been sought, but may not always  have been located. Such creation errors do not reflect on  the standard of medical care.

## 2020-07-15 DIAGNOSIS — M419 Scoliosis, unspecified: Secondary | ICD-10-CM | POA: Diagnosis not present

## 2020-07-15 DIAGNOSIS — R42 Dizziness and giddiness: Secondary | ICD-10-CM | POA: Diagnosis not present

## 2020-07-15 DIAGNOSIS — M5030 Other cervical disc degeneration, unspecified cervical region: Secondary | ICD-10-CM | POA: Diagnosis not present

## 2020-07-15 DIAGNOSIS — M47812 Spondylosis without myelopathy or radiculopathy, cervical region: Secondary | ICD-10-CM | POA: Diagnosis not present

## 2020-07-15 DIAGNOSIS — G43909 Migraine, unspecified, not intractable, without status migrainosus: Secondary | ICD-10-CM | POA: Diagnosis not present

## 2020-07-15 DIAGNOSIS — Z79899 Other long term (current) drug therapy: Secondary | ICD-10-CM | POA: Diagnosis not present

## 2020-07-19 ENCOUNTER — Ambulatory Visit: Payer: 59 | Admitting: Rheumatology

## 2020-07-22 MED FILL — HYDROXYCHLOROQUINE SULFATE: 200 | 28 days supply | Qty: 40 | Fill #1

## 2020-07-27 ENCOUNTER — Ambulatory Visit: Payer: 59 | Admitting: Rheumatology

## 2020-07-27 ENCOUNTER — Encounter: Payer: Self-pay | Admitting: Rheumatology

## 2020-07-27 ENCOUNTER — Other Ambulatory Visit: Payer: Self-pay

## 2020-07-27 VITALS — BP 110/77 | HR 84 | Resp 15 | Ht 63.0 in | Wt 200.4 lb

## 2020-07-27 DIAGNOSIS — M2242 Chondromalacia patellae, left knee: Secondary | ICD-10-CM

## 2020-07-27 DIAGNOSIS — E890 Postprocedural hypothyroidism: Secondary | ICD-10-CM

## 2020-07-27 DIAGNOSIS — Z79899 Other long term (current) drug therapy: Secondary | ICD-10-CM

## 2020-07-27 DIAGNOSIS — E282 Polycystic ovarian syndrome: Secondary | ICD-10-CM

## 2020-07-27 DIAGNOSIS — Z8261 Family history of arthritis: Secondary | ICD-10-CM

## 2020-07-27 DIAGNOSIS — E559 Vitamin D deficiency, unspecified: Secondary | ICD-10-CM

## 2020-07-27 DIAGNOSIS — Z6835 Body mass index (BMI) 35.0-35.9, adult: Secondary | ICD-10-CM

## 2020-07-27 DIAGNOSIS — M19041 Primary osteoarthritis, right hand: Secondary | ICD-10-CM

## 2020-07-27 DIAGNOSIS — M7062 Trochanteric bursitis, left hip: Secondary | ICD-10-CM

## 2020-07-27 DIAGNOSIS — M4124 Other idiopathic scoliosis, thoracic region: Secondary | ICD-10-CM | POA: Diagnosis not present

## 2020-07-27 DIAGNOSIS — M5030 Other cervical disc degeneration, unspecified cervical region: Secondary | ICD-10-CM

## 2020-07-27 DIAGNOSIS — M059 Rheumatoid arthritis with rheumatoid factor, unspecified: Secondary | ICD-10-CM

## 2020-07-27 DIAGNOSIS — G8929 Other chronic pain: Secondary | ICD-10-CM

## 2020-07-27 DIAGNOSIS — M5136 Other intervertebral disc degeneration, lumbar region: Secondary | ICD-10-CM | POA: Diagnosis not present

## 2020-07-27 DIAGNOSIS — M19071 Primary osteoarthritis, right ankle and foot: Secondary | ICD-10-CM | POA: Diagnosis not present

## 2020-07-27 DIAGNOSIS — M533 Sacrococcygeal disorders, not elsewhere classified: Secondary | ICD-10-CM

## 2020-07-27 DIAGNOSIS — M19072 Primary osteoarthritis, left ankle and foot: Secondary | ICD-10-CM

## 2020-07-27 DIAGNOSIS — M7918 Myalgia, other site: Secondary | ICD-10-CM

## 2020-07-27 DIAGNOSIS — Z8669 Personal history of other diseases of the nervous system and sense organs: Secondary | ICD-10-CM

## 2020-07-27 DIAGNOSIS — M19042 Primary osteoarthritis, left hand: Secondary | ICD-10-CM

## 2020-07-27 NOTE — Patient Instructions (Addendum)
Standing Labs We placed an order today for your standing lab work.   Please have your standing labs drawn in February and every 3 months  If possible, please have your labs drawn 2 weeks prior to your appointment so that the provider can discuss your results at your appointment.  We have open lab daily Monday through Thursday from 8:30-12:30 PM and 1:30-4:30 PM and Friday from 8:30-12:30 PM and 1:30-4:00 PM at the office of Dr. Bo Merino, [facility].   Please be advised, patients with office appointments requiring lab work will take precedents over walk-in lab work.  If possible, please come for your lab work on Monday and Friday afternoons, as you may experience shorter wait times. The office is located at [address], [place], [place], [place] [address] No appointment is necessary.   Labs are drawn by [facility]. Please bring your co-pay at the time of your lab draw.  You may receive a bill from [facility] for your lab work.  If you wish to have your labs drawn at another location, please call the office 24 hours in advance to send orders.  If you have any questions regarding directions or hours of operation,  please call [phone].   As a reminder, please drink plenty of water prior to coming for your lab work. Thanks!    Heart Disease Prevention   Your inflammatory disease increases your risk of heart disease which includes heart attack, stroke, atrial fibrillation (irregular heartbeats), high blood pressure, heart failure and atherosclerosis (plaque in the arteries).  It is important to reduce your risk by:   . Keep blood pressure, cholesterol, and blood sugar at healthy levels   . Smoking Cessation   . Maintain a healthy weight  o BMI 20-25   . Eat a healthy diet  o Plenty of fresh fruit, vegetables, and whole grains  o Limit saturated fats, foods high in sodium, and added sugars  o DASH and Mediterranean diet   . Increase physical activity   o Recommend moderate physically activity for 150 minutes per week/ 30 minutes a day for five days a week These can be broken up into three separate ten-minute sessions during the day.   . Reduce Stress  . Meditation, slow breathing exercises, yoga, coloring books  . Dental visits twice a year

## 2020-07-28 DIAGNOSIS — H35412 Lattice degeneration of retina, left eye: Secondary | ICD-10-CM | POA: Diagnosis not present

## 2020-07-28 DIAGNOSIS — H35411 Lattice degeneration of retina, right eye: Secondary | ICD-10-CM | POA: Diagnosis not present

## 2020-07-28 DIAGNOSIS — Z79899 Other long term (current) drug therapy: Secondary | ICD-10-CM | POA: Diagnosis not present

## 2020-07-28 DIAGNOSIS — M069 Rheumatoid arthritis, unspecified: Secondary | ICD-10-CM | POA: Diagnosis not present

## 2020-07-28 LAB — CBC WITH DIFFERENTIAL/PLATELET
Absolute Monocytes: 544 cells/uL (ref 200–950)
Basophils Absolute: 51 cells/uL (ref 0–200)
Basophils Relative: 0.8 %
Eosinophils Absolute: 70 cells/uL (ref 15–500)
Eosinophils Relative: 1.1 %
HCT: 41.7 % (ref 35.0–45.0)
Hemoglobin: 13.8 g/dL (ref 11.7–15.5)
Lymphs Abs: 1882 cells/uL (ref 850–3900)
MCH: 31.2 pg (ref 27.0–33.0)
MCHC: 33.1 g/dL (ref 32.0–36.0)
MCV: 94.3 fL (ref 80.0–100.0)
MPV: 9.7 fL (ref 7.5–12.5)
Monocytes Relative: 8.5 %
Neutro Abs: 3853 cells/uL (ref 1500–7800)
Neutrophils Relative %: 60.2 %
Platelets: 392 10*3/uL (ref 140–400)
RBC: 4.42 10*6/uL (ref 3.80–5.10)
RDW: 11.6 % (ref 11.0–15.0)
Total Lymphocyte: 29.4 %
WBC: 6.4 10*3/uL (ref 3.8–10.8)

## 2020-07-28 LAB — COMPLETE METABOLIC PANEL WITH GFR
AG Ratio: 1.6 (calc) (ref 1.0–2.5)
ALT: 12 U/L (ref 6–29)
AST: 17 U/L (ref 10–30)
Albumin: 4.2 g/dL (ref 3.6–5.1)
Alkaline phosphatase (APISO): 55 U/L (ref 31–125)
BUN: 13 mg/dL (ref 7–25)
CO2: 28 mmol/L (ref 20–32)
Calcium: 9.6 mg/dL (ref 8.6–10.2)
Chloride: 104 mmol/L (ref 98–110)
Creat: 0.73 mg/dL (ref 0.50–1.10)
GFR, Est African American: 119 mL/min/{1.73_m2} (ref >=60)
GFR, Est Non African American: 102 mL/min/{1.73_m2} (ref >=60)
Globulin: 2.7 g/dL (calc) (ref 1.9–3.7)
Glucose, Bld: 108 mg/dL (ref 65–139)
Potassium: 4 mmol/L (ref 3.5–5.3)
Sodium: 140 mmol/L (ref 135–146)
Total Bilirubin: 0.4 mg/dL (ref 0.2–1.2)
Total Protein: 6.9 g/dL (ref 6.1–8.1)

## 2020-07-28 NOTE — Progress Notes (Signed)
CBC and CMP are normal.

## 2020-07-29 ENCOUNTER — Telehealth: Payer: Self-pay

## 2020-07-29 ENCOUNTER — Other Ambulatory Visit: Payer: Self-pay

## 2020-07-29 ENCOUNTER — Encounter (INDEPENDENT_AMBULATORY_CARE_PROVIDER_SITE_OTHER): Payer: 59 | Admitting: Ophthalmology

## 2020-07-29 DIAGNOSIS — H43813 Vitreous degeneration, bilateral: Secondary | ICD-10-CM

## 2020-07-29 DIAGNOSIS — D3131 Benign neoplasm of right choroid: Secondary | ICD-10-CM | POA: Diagnosis not present

## 2020-07-29 DIAGNOSIS — H3540 Unspecified peripheral retinal degeneration: Secondary | ICD-10-CM | POA: Diagnosis not present

## 2020-07-29 NOTE — Telephone Encounter (Signed)
Patient called stating she had her field of vision yesterday at Dr. Zenia Resides office.  Patient states she brought a Plaquenil form for their office to fill out and fax back to Dr. Estanislado Pandy.   Patient states she also had an appointment with Dr. Zigmund Daniel, Retina specialist this morning and was told his notes would be in Epic for Dr. Estanislado Pandy to review.

## 2020-07-29 NOTE — Telephone Encounter (Signed)
[facility] is closed. Will call Monday to request results.

## 2020-08-17 ENCOUNTER — Ambulatory Visit (INDEPENDENT_AMBULATORY_CARE_PROVIDER_SITE_OTHER): Payer: 59

## 2020-08-17 ENCOUNTER — Encounter: Payer: Self-pay | Admitting: Specialist

## 2020-08-17 ENCOUNTER — Telehealth: Payer: Self-pay | Admitting: Specialist

## 2020-08-17 ENCOUNTER — Other Ambulatory Visit: Payer: Self-pay | Admitting: Rheumatology

## 2020-08-17 ENCOUNTER — Ambulatory Visit: Payer: 59 | Admitting: Specialist

## 2020-08-17 VITALS — BP 132/87 | HR 78 | Ht 63.0 in | Wt 200.4 lb

## 2020-08-17 DIAGNOSIS — M542 Cervicalgia: Secondary | ICD-10-CM

## 2020-08-17 DIAGNOSIS — M545 Low back pain, unspecified: Secondary | ICD-10-CM

## 2020-08-17 DIAGNOSIS — G5602 Carpal tunnel syndrome, left upper limb: Secondary | ICD-10-CM | POA: Diagnosis not present

## 2020-08-17 DIAGNOSIS — G5601 Carpal tunnel syndrome, right upper limb: Secondary | ICD-10-CM | POA: Diagnosis not present

## 2020-08-17 DIAGNOSIS — M47812 Spondylosis without myelopathy or radiculopathy, cervical region: Secondary | ICD-10-CM

## 2020-08-17 DIAGNOSIS — R200 Anesthesia of skin: Secondary | ICD-10-CM | POA: Diagnosis not present

## 2020-08-17 DIAGNOSIS — G5622 Lesion of ulnar nerve, left upper limb: Secondary | ICD-10-CM

## 2020-08-17 DIAGNOSIS — R202 Paresthesia of skin: Secondary | ICD-10-CM

## 2020-08-17 MED ORDER — HYDROXYCHLOROQUINE SULFATE 200 MG PO TABS: ORAL_TABLET | ORAL | 0 refills | Status: DC

## 2020-08-17 MED ORDER — GABAPENTIN 100 MG PO CAPS: 100.0000 mg | ORAL_CAPSULE | Freq: Every day | ORAL | 3 refills | Status: DC

## 2020-08-17 MED ORDER — CELECOXIB 100 MG PO CAPS: 100.0000 mg | ORAL_CAPSULE | Freq: Two times a day (BID) | ORAL | 3 refills | Status: DC

## 2020-08-17 MED FILL — HYDROXYCHLOROQUINE SULFATE: 200 | 84 days supply | Qty: 120 | Fill #0

## 2020-08-17 MED FILL — GABAPENTIN 100 MG CAPSULE: 100 | 30 days supply | Qty: 30 | Fill #0

## 2020-08-17 MED FILL — CELECOXIB 100 MG CAP: 100 | 90 days supply | Qty: 180 | Fill #0

## 2020-08-17 NOTE — Patient Instructions (Signed)
Plan: Avoid frequent bending and stooping  No lifting greater than 10 lbs. May use ice or moist heat for pain. Weight loss is of benefit. Best medication for lumbar disc disease is arthritis medications like motrin, celebrex and naprosyn. Exercise is important to improve your indurance and does allow people to function better inspite of back pain. Avoid overhead lifting and overhead use of the arms. Do not lift greater than 5 lbs. Adjust head rest in vehicle to prevent hyperextension if rear ended. Take extra precautions to avoid falling. Carpal Tunnel Syndrome  Carpal tunnel syndrome is a condition that causes pain in your hand and arm. The carpal tunnel is a narrow area located on the palm side of your wrist. Repeated wrist motion or certain diseases may cause swelling within the tunnel. This swelling pinches the main nerve in the wrist (median nerve). What are the causes? This condition may be caused by:  Repeated wrist motions.  Wrist injuries.  Arthritis.  A cyst or tumor in the carpal tunnel.  Fluid buildup during pregnancy. Sometimes the cause of this condition is not known. What increases the risk? This condition is more likely to develop in:  People who have jobs that cause them to repeatedly move their wrists in the same motion, such as Art gallery manager.  Women.  People with certain conditions, such as: ? Diabetes. ? Obesity. ? An underactive thyroid (hypothyroidism). ? Kidney failure. What are the signs or symptoms? Symptoms of this condition include:  A tingling feeling in your fingers, especially in your thumb, index, and middle fingers.  Tingling or numbness in your hand.  An aching feeling in your entire arm, especially when your wrist and elbow are bent for long periods of time.  Wrist pain that goes up your arm to your shoulder.  Pain that goes down into your palm or fingers.  A weak feeling in your hands. You may have trouble grabbing and  holding items. Your symptoms may feel worse during the night. How is this diagnosed? This condition is diagnosed with a medical history and physical exam. You may also have tests, including:  An electromyogram (EMG). This test measures electrical signals sent by your nerves into the muscles.  X-rays. How is this treated? Treatment for this condition includes:  Lifestyle changes. It is important to stop doing or modify the activity that caused your condition.  Physical or occupational therapy.  Medicines for pain and inflammation. This may include medicine that is injected into your wrist.  A wrist splint.  Surgery. Follow these instructions at home: If you have a splint:   Wear it as told by your health care provider. Remove it only as told by your health care provider.  Loosen the splint if your fingers become numb and tingle, or if they turn cold and blue.  Keep the splint clean and dry. General instructions   Take over-the-counter and prescription medicines only as told by your health care provider.  Rest your wrist from any activity that may be causing your pain. If your condition is work related, talk to your employer about changes that can be made, such as getting a wrist pad to use while typing.  If directed, apply ice to the painful area: ? Put ice in a plastic bag. ? Place a towel between your skin and the bag. ? Leave the ice on for 20 minutes, 2-3 times per day.  Keep all follow-up visits as told by your health care provider. This is important.  Do any exercises as told by your health care provider, physical therapist, or occupational therapist. Contact a health care provider if:  You have new symptoms.  Your pain is not controlled with medicines.  Your symptoms get worse. This information is not intended to replace advice given to you by your health care provider. Make sure you discuss any questions you have with your health care provider. Document  Released: 08/24/2000 Document Revised: 01/05/2016 Document Reviewed: 05/08/2017 Elsevier Interactive Patient Education  2017 [employer]  Gabapentin 100 mg po Qhs Celebrex 100 mg BID. Stop the motrin if you are taking celebrex.

## 2020-08-17 NOTE — Telephone Encounter (Signed)
Last Visit: 07/27/2020 Next Visit: 10/27/2020 Labs: 07/27/2020 CBC and CMP are normal. Eye exam: 07/28/2020 WNL  Current Dose per office note 07/27/2020: Plaquenil 200 mg twice daily Monday through Friday DX: Seropositive rheumatoid arthritis   Okay to refill per Dr. Estanislado Pandy

## 2020-08-17 NOTE — Telephone Encounter (Signed)
Received medical records release form from patient  

## 2020-08-17 NOTE — Progress Notes (Signed)
Office Visit Note   Patient: Taylor Bennett           Date of Birth: [birth year]           MRN: [id number] Visit Date: 08/17/2020              Requested by: Lennie Odor, PA [address] [place],  [place] [address] PCP: Lennie Odor, PA   Assessment & Plan: Visit Diagnoses:  1. Low back pain, unspecified back pain laterality, unspecified chronicity, unspecified whether sciatica present   2. Cervicalgia   3. Numbness and tingling of both upper extremities   4. Carpal tunnel syndrome, left upper limb   5. Carpal tunnel syndrome, right upper limb   6. Cubital tunnel syndrome on left   7. Spondylosis without myelopathy or radiculopathy, cervical region     Plan: Avoid frequent bending and stooping  No lifting greater than 10 lbs. May use ice or moist heat for pain. Weight loss is of benefit. Best medication for lumbar disc disease is arthritis medications like motrin, celebrex and naprosyn. Exercise is important to improve your indurance and does allow people to function better inspite of back pain. Avoid overhead lifting and overhead use of the arms. Do not lift greater than 5 lbs. Adjust head rest in vehicle to prevent hyperextension if rear ended. Take extra precautions to avoid falling. Carpal Tunnel Syndrome  Carpal tunnel syndrome is a condition that causes pain in your hand and arm. The carpal tunnel is a narrow area located on the palm side of your wrist. Repeated wrist motion or certain diseases may cause swelling within the tunnel. This swelling pinches the main nerve in the wrist (median nerve). What are the causes? This condition may be caused by:  Repeated wrist motions.  Wrist injuries.  Arthritis.  A cyst or tumor in the carpal tunnel.  Fluid buildup during pregnancy. Sometimes the cause of this condition is not known. What increases the risk? This condition is more likely to develop in:  People who have jobs that cause them to  repeatedly move their wrists in the same motion, such as Art gallery manager.  Women.  People with certain conditions, such as: ? Diabetes. ? Obesity. ? An underactive thyroid (hypothyroidism). ? Kidney failure. What are the signs or symptoms? Symptoms of this condition include:  A tingling feeling in your fingers, especially in your thumb, index, and middle fingers.  Tingling or numbness in your hand.  An aching feeling in your entire arm, especially when your wrist and elbow are bent for long periods of time.  Wrist pain that goes up your arm to your shoulder.  Pain that goes down into your palm or fingers.  A weak feeling in your hands. You may have trouble grabbing and holding items. Your symptoms may feel worse during the night. How is this diagnosed? This condition is diagnosed with a medical history and physical exam. You may also have tests, including:  An electromyogram (EMG). This test measures electrical signals sent by your nerves into the muscles.  X-rays. How is this treated? Treatment for this condition includes:  Lifestyle changes. It is important to stop doing or modify the activity that caused your condition.  Physical or occupational therapy.  Medicines for pain and inflammation. This may include medicine that is injected into your wrist.  A wrist splint.  Surgery. Follow these instructions at home: If you have a splint:   Wear it as told by your  health care provider. Remove it only as told by your health care provider.  Loosen the splint if your fingers become numb and tingle, or if they turn cold and blue.  Keep the splint clean and dry. General instructions   Take over-the-counter and prescription medicines only as told by your health care provider.  Rest your wrist from any activity that may be causing your pain. If your condition is work related, talk to your employer about changes that can be made, such as getting a wrist pad to use  while typing.  If directed, apply ice to the painful area: ? Put ice in a plastic bag. ? Place a towel between your skin and the bag. ? Leave the ice on for 20 minutes, 2-3 times per day.  Keep all follow-up visits as told by your health care provider. This is important.  Do any exercises as told by your health care provider, physical therapist, or occupational therapist. Contact a health care provider if:  You have new symptoms.  Your pain is not controlled with medicines.  Your symptoms get worse. This information is not intended to replace advice given to you by your health care provider. Make sure you discuss any questions you have with your health care provider. Document Released: 08/24/2000 Document Revised: 01/05/2016 Document Reviewed: 05/08/2017 Elsevier Interactive Patient Education  2017 [employer]  Gabapentin 100 mg po Qhs Celebrex 100 mg BID. Stop the motrin if you are taking celebrex. Follow-Up Instructions: No follow-ups on file.  Presently her exam and history suggest mechanical neck and low back pain that is due to spondylosis and degenerative disc disease. She has symptoms of numbness and tingling in the arms that may relate to compressive neuropathy at the elbow and wrist and possibly  Due to cervical spondylosis or both. There is no focal neurologic deficit and she does not consider the severity to be bad enough nor Do I to warrant EMG/NCV. The affect of hypo or hyper thyroidism may also impact the sensory symptoms in her arms. She has findings of degenerative disc disease of the lumbar spine. Use of NSAIDs intermittantly is recommended and a Home Exercise  Program to bind endurance to these conditions. She likely will experience pain in the lumbar spine and from the cervical spine that will Require 2-3 days to recover from due to exacerbation of degenertive disc disease. It is appropriate to use FLMA for these periods of dysfunction. A surgical solution at this  time is not recommended as the pain due to ddd ansdspondylosis does not cause motor or sensory Deficit.  Orders:  Orders Placed This Encounter  Procedures  . XR Lumbar Spine 2-3 Views  . XR Cervical Spine 2 or 3 views   No orders of the defined types were placed in this encounter.     Procedures: No procedures performed   Clinical Data: No additional findings.   Subjective: Chief Complaint  Patient presents with  . Lower Back - Pain    39 year old female with history of back pain and pain into the upper posterior thorax and shoulders and shoulder blades. When there is pain into the lower back she is not comfortable even with lying down. It can awaken her from sleep. She is having difficulty even standing to make a full course meal without severe pain. She was last seen 2-3 years ago and since then was diagnosed with RA by Dr. Patrecia Pour. She is not having bowel or bladder difficulty. She is able to  walk a mile on a good day, has not been exercising regularly due to increased episodes of pain. Her sister is a physical therapist and a massage therapist. The pain is as high as an 76 when it occurs and she is not on medication. She is working full time in 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 She is having FLMA to help with her painful periods when she is not functioning well. History of bilateral thyroidectomy for Hashimoto's thyroiditis. Taking plaquenil for RA and motrin and skelaxin.  Later two for back pain and inflamation.  Reports that she feels like she is doing exercises enough for her back from those she was taught in PT a couple of years back.      Review of Systems  Constitutional: Negative.   HENT: Negative.   Eyes: Negative.   Respiratory: Negative.   Cardiovascular: Negative.   Gastrointestinal: Negative.   Endocrine: Negative.    Genitourinary: Negative.   Musculoskeletal: Negative.   Skin: Negative.   Allergic/Immunologic: Negative.   Neurological: Negative.   Hematological: Negative.   Psychiatric/Behavioral: Negative.      Objective: Vital Signs: BP 132/87 (BP Location: Left Arm, Patient Position: Sitting)   Pulse 78   Ht 5\' 3"  (1.6 m)   Wt 200 lb 6.4 oz (90.9 kg)   BMI 35.50 kg/m   Physical Exam Constitutional:      Appearance: She is well-developed.  HENT:     Head: Normocephalic and atraumatic.  Eyes:     Pupils: Pupils are equal, round, and reactive to light.  Pulmonary:     Effort: Pulmonary effort is normal.     Breath sounds: Normal breath sounds.  Abdominal:     General: Bowel sounds are normal.     Palpations: Abdomen is soft.  Musculoskeletal:     Cervical back: Normal range of motion and neck supple.     Lumbar back: Negative right straight leg raise test and negative left straight leg raise test.  Skin:    General: Skin is warm and dry.  Neurological:     Mental Status: She is alert and oriented to person, place, and time.  Psychiatric:        Behavior: Behavior normal.        Thought Content: Thought content normal.        Judgment: Judgment normal.     Back Exam   Tenderness  The patient is experiencing tenderness in the lumbar and cervical.  Range of Motion  Extension: normal  Flexion:  80 abnormal  Lateral bend right:  80 abnormal  Lateral bend left:  80 abnormal  Rotation right: 80  Rotation left: 80   Muscle Strength  Right Quadriceps:  5/5  Left Quadriceps:  5/5  Right Hamstrings:  5/5  Left Hamstrings:  5/5   Tests  Straight leg raise right: negative Straight leg raise left: negative  Reflexes  Patellar: 2/4 Achilles: 2/4 Babinski's sign: normal   Other  Toe walk: normal Heel walk: normal Sensation: normal Gait: normal  Erythema: no back redness Scars: absent      Specialty Comments:  No specialty comments available.  Imaging: No  results found.   PMFS History: Patient Active Problem List   Diagnosis Date Noted  . Paresthesia 11/30/2016  . Neck pain 10/23/2016  . Left cervical radiculopathy 10/23/2016  . Vitamin D deficiency  08/10/2016  . PCOS (polycystic ovarian syndrome) 06/16/2013  . Migraines 06/16/2013  . Hypothyroidism 06/10/2013   Past Medical History:  Diagnosis Date  . Back pain   . Cervical pain   . PCOS (polycystic ovarian syndrome)   . Scoliosis   . Thyroid disease     Family History  Problem Relation Age of Onset  . Thyroid disease Mother   . Arthritis Mother   . Diabetes Father   . Congestive Heart Failure Father   . Alcoholism Father   . Neuropathy Father   . Arthritis Father   . Osteoarthritis Maternal Aunt   . Rheum arthritis Paternal Grandmother   . Healthy Daughter   . Polycystic ovary syndrome Daughter     Past Surgical History:  Procedure Laterality Date  . ANKLE SURGERY  2009   ORIF   . THYROIDECTOMY    . WISDOM TOOTH EXTRACTION     Social History   Occupational History  . Occupation: ED - Admissions  Tobacco Use  . Smoking status: Former Smoker    Start date: 10/13/2011  . Smokeless tobacco: Never Used  Vaping Use  . Vaping Use: Never used  Substance and Sexual Activity  . Alcohol use: Yes    Comment: occ  . Drug use: No  . Sexual activity: Not on file

## 2020-08-30 DIAGNOSIS — H16423 Pannus (corneal), bilateral: Secondary | ICD-10-CM | POA: Diagnosis not present

## 2020-08-30 DIAGNOSIS — H40013 Open angle with borderline findings, low risk, bilateral: Secondary | ICD-10-CM | POA: Diagnosis not present

## 2020-09-19 ENCOUNTER — Encounter: Payer: Self-pay | Admitting: Rheumatology

## 2020-09-19 NOTE — Telephone Encounter (Signed)
Please advise patient that gabapentin or Celebrex do not interfere with Plaquenil.

## 2020-09-23 ENCOUNTER — Other Ambulatory Visit: Payer: Self-pay

## 2020-09-23 ENCOUNTER — Encounter: Payer: Self-pay | Admitting: Internal Medicine

## 2020-09-23 ENCOUNTER — Ambulatory Visit (INDEPENDENT_AMBULATORY_CARE_PROVIDER_SITE_OTHER): Payer: 59 | Admitting: Internal Medicine

## 2020-09-23 VITALS — BP 122/80 | Ht 63.0 in | Wt 199.0 lb

## 2020-09-23 DIAGNOSIS — E538 Deficiency of other specified B group vitamins: Secondary | ICD-10-CM | POA: Diagnosis not present

## 2020-09-23 DIAGNOSIS — E890 Postprocedural hypothyroidism: Secondary | ICD-10-CM | POA: Diagnosis not present

## 2020-09-23 DIAGNOSIS — E559 Vitamin D deficiency, unspecified: Secondary | ICD-10-CM | POA: Diagnosis not present

## 2020-09-23 DIAGNOSIS — E282 Polycystic ovarian syndrome: Secondary | ICD-10-CM

## 2020-09-23 LAB — LIPID PANEL
Cholesterol: 166 mg/dL (ref 0–200)
HDL: 52.5 mg/dL (ref >39.00)
LDL Cholesterol: 92 mg/dL (ref 0–99)
NonHDL: 113.87
Total CHOL/HDL Ratio: 3
Triglycerides: 108 mg/dL (ref 0.0–149.0)
VLDL: 21.6 mg/dL (ref 0.0–40.0)

## 2020-09-23 LAB — VITAMIN B12: Vitamin B-12: 443 pg/mL (ref 211–911)

## 2020-09-23 LAB — T4, FREE: Free T4: 0.6 ng/dL (ref 0.60–1.60)

## 2020-09-23 LAB — T3, FREE: T3, Free: 3.1 pg/mL (ref 2.3–4.2)

## 2020-09-23 LAB — TSH: TSH: 9.61 u[IU]/mL — ABNORMAL HIGH (ref 0.35–4.50)

## 2020-09-23 NOTE — Progress Notes (Addendum)
Patient ID: Taylor Bennett, female   DOB: 04-Jul-1981, 40 y.o.   MRN: [id number]   This visit occurred during the SARS-CoV-2 public health emergency.  Safety protocols were in place, including screening questions prior to the visit, additional usage of staff PPE, and extensive cleaning of exam room while observing appropriate contact time as indicated for disinfecting solutions.   HPI  Taylor Bennett is a 40 y.o.-year-old female, initially referred by her PCP, Redmon, Noelle, PA, returning for follow-up for postsurgical hypothyroidism and hypoparathyroidism and vitamin D deficiency, low vitamin B12. She previously saw Dr. Dwyane Dee.  Last visit with me 6 months ago.  Before last visit she was seen by Dr. Estanislado Pandy and diagnosed with RA.  She started Plaquenil 2x a day in 07/2020. She has occasional swelling and stiffness in joints.  Reviewed calcium and PTH levels: Lab Results  Component Value Date   PTH 12 (L) 04/01/2020   PTH Comment 04/01/2020   PTH CANCELED 10/02/2019   PTH Comment 10/02/2019   PTH 9 (L) 05/08/2019   CALCIUM 9.6 07/27/2020   CALCIUM 9.2 06/06/2020   CALCIUM 9.1 04/01/2020   CALCIUM 8.9 10/02/2019   CALCIUM 9.5 05/08/2019   CALCIUM 9.3 09/26/2018   CALCIUM 8.8 (L) 07/19/2015  05/29/2019: Calcium 8.2 (8.7-10.2)  Magnesium and phosphorus levels were normal: Component     Latest Ref Rng & Units 05/08/2019  Phosphorus     2.3 - 4.6 mg/dL 3.2  Magnesium     1.5 - 2.5 mg/dL 1.7   Reviewed and addended history: Pt. has a h/o Hashimoto's thyroiditis and now has postsurgical hypothyroidism after her total thyroidectomy for neck compression symptoms and a suspicious nodule on 12/19/2015 (final pathology >> benign). Surgery was performed by Dr. Celine Ahr.   She could not tolerate Synthroid due to significant fatigue.  She is on Armour Thyroid 90 mg alternating with 60 mg  (equivalent of 125 mcg levothyroxine daily): - in am - fasting - at least 30 min from b'fast - no  calcium - no iron - + multivitamins at night - no PPIs - not on Biotin  Reviewed her TFTs: Lab Results  Component Value Date   TSH 5.59 (H) 04/01/2020   TSH 2.320 10/02/2019   TSH 2.42 09/26/2018   TSH 2.49 07/25/2018   TSH 5.340 (H) 05/02/2018   TSH 2.770 10/10/2017   TSH 1.41 02/14/2017   TSH 3.42 08/09/2016   TSH 0.94 07/28/2015   TSH 0.962 07/19/2015   FREET4 0.60 04/01/2020   FREET4 0.78 (L) 10/02/2019   FREET4 0.70 09/26/2018   FREET4 0.69 07/25/2018   FREET4 0.80 (L) 05/02/2018   FREET4 0.89 10/10/2017   FREET4 0.74 02/14/2017   FREET4 0.52 (L) 08/09/2016   FREET4 0.60 07/28/2015   FREET4 0.77 06/07/2014  05/29/2019: TSH 2.48 05/06/2019: TSH 2.28 01/2016: TSH 2.6  Pt denies: - feeling nodules in neck - hoarseness - choking - SOB with lying down + Mild dysphagia occasionally after surgery.   She has + FH of thyroid disorders in: mother. No FH of thyroid cancer. No h/o radiation tx to head or neck.  No herbal supplements. No Biotin use. No recent steroids use.   PCOS: -She was on metformin- but stopped 2/2 diarrhea in 07/2017 >> she restarted this in 09/2017 but stopped as she did not like how it made her feel -She was on NuvaRing (per Dr. Landry Mellow) but now off for more than 3 years -Restarted Ovasitol 09/2017 but did not continue it -Menses  are regular -No hirsutism or acne, but continues to have hair loss  Vitamin D deficiency:  Vitamin D levels were reviewed: Lab Results  Component Value Date   VD25OH 83.9 04/01/2020   VD25OH 87.3 10/02/2019   VD25OH 40.72 05/08/2019   VD25OH 32.22 09/26/2018   VD25OH 24.33 (L) 07/25/2018   VD25OH 50.7 09/26/2017   VD25OH 49.42 02/14/2017   VD25OH 24.47 (L) 10/23/2016   VD25OH 12.07 (L) 08/09/2016   She continues on 5000 units x 2 vitamin D3 daily.  Low vitamin B12:  Reviewed vitamin B12 levels: Lab Results  Component Value Date   VITAMINB12 503 04/01/2020   VITAMINB12 863 10/02/2019   VITAMINB12 310  09/26/2018   VITAMINB12 550 09/26/2017   She continues on a multivitamin with B12.  She also had a severe vertigo episode in summer 2018 >> on Meclizine.  She also has DDD in cervical spine. Sees Dr. Louanne Skye. May start Gabapentin. Uses Lidocaine patches. She has a family history of RA.  ROS: Constitutional: no weight gain/no weight loss, no fatigue, no subjective hyperthermia, no subjective hypothermia Eyes: no blurry vision, no xerophthalmia ENT: no sore throat, no nodules palpated in neck, no dysphagia, no odynophagia, no hoarseness Cardiovascular: no CP/no SOB/no palpitations/no leg swelling Respiratory: no cough/no SOB/no wheezing Gastrointestinal: no N/no V/no D/no C/no acid reflux Musculoskeletal: no muscle aches/+ joint aches and swelling Skin: no rashes, + hair loss Neurological: no tremors/no numbness/no tingling/no dizziness  I reviewed pt's medications, allergies, PMH, social hx, family hx, and changes were documented in the history of present illness. Otherwise, unchanged from my initial visit note.  Past Medical History:  Diagnosis Date  . Back pain   . Cervical pain   . PCOS (polycystic ovarian syndrome)   . Scoliosis   . Thyroid disease    Past Surgical History:  Procedure Laterality Date  . ANKLE SURGERY  2009   ORIF   . THYROIDECTOMY    . WISDOM TOOTH EXTRACTION     Social History   Social History  . Marital status: Single    Spouse name: N/A  . Number of children: N/A   Occupational History  . Not on file.   Social History Main Topics  . Smoking status: Never Smoker  . Smokeless tobacco: Never Used  . Alcohol use No  . Drug use: No   Current Outpatient Medications on File Prior to Visit  Medication Sig Dispense Refill  . ARMOUR THYROID 60 MG tablet Take 1 tablet (60 mg total) by mouth every other day. 45 tablet 3  . ARMOUR THYROID 90 MG tablet Take 1 tablet (90 mg total) by mouth every other day. 45 tablet 3  . Ascorbic Acid (VITAMIN C PO) Take  by mouth daily.    . celecoxib (CELEBREX) 100 MG capsule Take 1 capsule (100 mg total) by mouth 2 (two) times daily. 180 capsule 3  . Cholecalciferol (VITAMIN D PO) Take 5,000 Units by mouth 2 (two) times daily.     Marland Kitchen gabapentin (NEURONTIN) 100 MG capsule Take 1 capsule (100 mg total) by mouth at bedtime. 30 capsule 3  . hydroxychloroquine (PLAQUENIL) 200 MG tablet Take 1 tablet p.o. twice daily Monday to Friday. 120 tablet 0  . ibuprofen (ADVIL,MOTRIN) 200 MG tablet Take 200 mg by mouth every 6 (six) hours as needed. Takes 600mg  - 800mg  daily, as needed.    . loratadine (CLARITIN) 10 MG tablet Take 10 mg by mouth daily.    . meclizine (ANTIVERT) 25 MG  tablet Take 1 tablet (25 mg total) by mouth 3 (three) times daily as needed for dizziness. 20 tablet 0  . Multiple Vitamin (MULTIVITAMIN PO) Take by mouth daily.    . ondansetron (ZOFRAN ODT) 4 MG disintegrating tablet Take 1 tablet (4 mg total) by mouth every 8 (eight) hours as needed for nausea or vomiting. 21 tablet 0  . rizatriptan (MAXALT) 10 MG tablet Take 10 mg by mouth as needed for migraine. May repeat in 2 hours if needed    . ZINC CITRATE-PHYTASE PO Take by mouth.     No current facility-administered medications on file prior to visit.   No Known Allergies Family History  Problem Relation Age of Onset  . Thyroid disease Mother   . Arthritis Mother   . Diabetes Father   . Congestive Heart Failure Father   . Alcoholism Father   . Neuropathy Father   . Arthritis Father   . Osteoarthritis Maternal Aunt   . Rheum arthritis Paternal Grandmother   . Healthy Daughter   . Polycystic ovary syndrome Daughter    PE: BP 122/80 (BP Location: Left Arm, Patient Position: Sitting, Cuff Size: Normal)   Ht 5\' 3"  (1.6 m)   Wt 199 lb (90.3 kg)   BMI 35.25 kg/m  Wt Readings from Last 3 Encounters:  09/23/20 199 lb (90.3 kg)  08/17/20 200 lb 6.4 oz (90.9 kg)  07/27/20 200 lb 6.4 oz (90.9 kg)   Constitutional: overweight, in NAD Eyes:  PERRLA, EOMI, no exophthalmos ENT: moist mucous membranes, no thyromegaly, no cervical lymphadenopathy Cardiovascular: RRR, No MRG Respiratory: CTA B Gastrointestinal: abdomen soft, NT, ND, BS+ Musculoskeletal: no deformities, strength intact in all 4 Skin: moist, warm, no rashes Neurological: no tremor with outstretched hands, DTR normal in all 4  ASSESSMENT: 1. Postsurgical Hypothyroidism  2. Vitamin D deficiency  3.  Low vitamin B12  4.  PCOS  5.  Hypocalcemia due to postsurgical hypoparathyroidism  PLAN:  1. Patient with longstanding hypothyroidism, after total thyroidectomy.  She could not tolerate levothyroxine and is now on Armour Thyroid. - latest thyroid labs reviewed with pt >> slightly elevated TSH: Lab Results  Component Value Date   TSH 5.59 (H) 04/01/2020  -I advised her to increase Armour Thyroid dose after the above results return to 90 mg 4/7 days and 60 mg 3/7 days. However, at this visit, she continues Let's 90 alternating with 60 mg of iron every other day - pt feels good on this dose. - we discussed about taking the thyroid hormone every day, with water, >30 minutes before breakfast, separated by >4 hours from acid reflux medications, calcium, iron, multivitamins. Pt. is taking it correctly. - will check thyroid tests today: TSH, free T3 and fT4 - If labs are abnormal, she will need to return for repeat TFTs in 1.5 months  2. Vitamin D deficiency -She continues on 10,000 units vitamin D daily -level normal at last visit, in 03/2020 -We will recheck the vitamin D level today  3. Low vitamin B12 -Discovered during investigation for fatigue -At last visit, in 03/2020 she was only on a multivitamin, despite advised to start B12 1000 mcg daily. -However, vitamin B12 was normal so we continued the above dose -We will recheck the vitamin B12 today  4.  PCOS -Previously on of Ovasitol and Metformin, now off -+ regular menstrual cycles -Latest HbA1c was  excellent, 5.2% in 09/2019 -we will repeat at this visit -lipids were normal with the exception of a slightly  high LDL.  We will repeat this today.  5.  Hypocalcemia -Possibly developed after her thyroid surgery but also possibly due to vitamin D deficiency or excessive hydration -Latest calcium level was normal at last visit, in 03/2020: 9.1, however, PTH was slightly low at 12 (15-65).  Another calcium repeated in 05/2020 was also normal, at 9.2. -Our target for her is the low normal range -She denies perioral numbness and chronic cramping but has occasionally generalized aches and pains, possibly related to her RA  Orders Placed This Encounter  Procedures  . TSH  . T4, free  . T3, free  . Vitamin D, 25-hydroxy  . Vitamin B12  . Lipid panel  . Hemoglobin A1c  Needs refills of Armour: 90 days.  Component     Latest Ref Rng & Units 09/23/2020  Cholesterol, Total     100 - 199 mg/dL 178  Triglycerides     0 - 149 mg/dL 109  HDL Cholesterol     >39 mg/dL 52  VLDL Cholesterol Cal     5 - 40 mg/dL 20  LDL Chol Calc (NIH)     0 - 99 mg/dL 106 (H)  Total CHOL/HDL Ratio     0.0 - 4.4 ratio 3.4  TSH     0.450 - 4.500 uIU/mL 9.560 (H)  T4,Free(Direct)     0.82 - 1.77 ng/dL 0.78 (L)  Triiodothyronine,Free,Serum     2.0 - 4.4 pg/mL 3.0  Vitamin D, 25-Hydroxy     30.0 - 100.0 ng/mL 51.3  Vitamin B12     232 - 1,245 pg/mL 628   Labs OK with the exception of a slightly high LDL and also high TSH/low fT4. Will increase the dose of Armour to 90 mg 5/7 days and 60 mg 2/7 days and repeat the TFTs in 1.5 mo.  Philemon Kingdom, MD PhD [facility] Endocrin[facility]

## 2020-09-23 NOTE — Patient Instructions (Addendum)
Please stop at the lab.  Continue Armour 90 mg with 60 mg every other day.  Take the thyroid hormone every day, with water, at least 30 minutes before breakfast, separated by at least 4 hours from: - acid reflux medications - calcium - iron - multivitamins  Please come back for a follow-up appointment in 1 year.

## 2020-09-24 LAB — LIPID PANEL
Chol/HDL Ratio: 3.4 ratio (ref 0.0–4.4)
Cholesterol, Total: 178 mg/dL (ref 100–199)
HDL: 52 mg/dL (ref >39)
LDL Chol Calc (NIH): 106 mg/dL — ABNORMAL HIGH (ref 0–99)
Triglycerides: 109 mg/dL (ref 0–149)
VLDL Cholesterol Cal: 20 mg/dL (ref 5–40)

## 2020-09-24 LAB — T4, FREE: Free T4: 0.78 ng/dL — ABNORMAL LOW (ref 0.82–1.77)

## 2020-09-24 LAB — VITAMIN B12: Vitamin B-12: 628 pg/mL (ref 232–1245)

## 2020-09-24 LAB — TSH: TSH: 9.56 u[IU]/mL — ABNORMAL HIGH (ref 0.450–4.500)

## 2020-09-24 LAB — HEMOGLOBIN A1C

## 2020-09-24 LAB — VITAMIN D 25 HYDROXY (VIT D DEFICIENCY, FRACTURES): Vit D, 25-Hydroxy: 51.3 ng/mL (ref 30.0–100.0)

## 2020-09-24 LAB — T3, FREE: T3, Free: 3 pg/mL (ref 2.0–4.4)

## 2020-09-26 ENCOUNTER — Other Ambulatory Visit: Payer: Self-pay | Admitting: Internal Medicine

## 2020-09-26 MED ORDER — ARMOUR THYROID 90 MG PO TABS: ORAL_TABLET | ORAL | 3 refills | Status: DC

## 2020-09-26 MED ORDER — ARMOUR THYROID 60 MG PO TABS: ORAL_TABLET | ORAL | 3 refills | Status: DC

## 2020-09-26 MED FILL — ARMOUR THYROID 90 MG TABLET: 90 | 84 days supply | Qty: 60 | Fill #0

## 2020-09-26 MED FILL — ARMOUR THYROID 60 MG TABLET: 60 | 84 days supply | Qty: 24 | Fill #0

## 2020-09-26 NOTE — Addendum Note (Signed)
Addended by: Philemon Kingdom on: 09/26/2020 10:09 AM   Modules accepted: Orders

## 2020-09-27 ENCOUNTER — Other Ambulatory Visit: Payer: Self-pay

## 2020-09-27 LAB — HEMOGLOBIN A1C: Hgb A1c MFr Bld: 5.2 % (ref 4.6–6.5)

## 2020-09-28 ENCOUNTER — Encounter: Payer: Self-pay | Admitting: Specialist

## 2020-09-28 ENCOUNTER — Ambulatory Visit (INDEPENDENT_AMBULATORY_CARE_PROVIDER_SITE_OTHER): Payer: 59 | Admitting: Specialist

## 2020-09-28 VITALS — BP 107/75 | HR 65 | Ht 63.0 in | Wt 199.0 lb

## 2020-09-28 DIAGNOSIS — R200 Anesthesia of skin: Secondary | ICD-10-CM

## 2020-09-28 DIAGNOSIS — G5602 Carpal tunnel syndrome, left upper limb: Secondary | ICD-10-CM

## 2020-09-28 DIAGNOSIS — M47812 Spondylosis without myelopathy or radiculopathy, cervical region: Secondary | ICD-10-CM

## 2020-09-28 DIAGNOSIS — M545 Low back pain, unspecified: Secondary | ICD-10-CM | POA: Diagnosis not present

## 2020-09-28 DIAGNOSIS — M2170 Unequal limb length (acquired), unspecified site: Secondary | ICD-10-CM | POA: Diagnosis not present

## 2020-09-28 DIAGNOSIS — G5601 Carpal tunnel syndrome, right upper limb: Secondary | ICD-10-CM

## 2020-09-28 DIAGNOSIS — M542 Cervicalgia: Secondary | ICD-10-CM

## 2020-09-28 DIAGNOSIS — G5622 Lesion of ulnar nerve, left upper limb: Secondary | ICD-10-CM | POA: Diagnosis not present

## 2020-09-28 DIAGNOSIS — M25572 Pain in left ankle and joints of left foot: Secondary | ICD-10-CM

## 2020-09-28 DIAGNOSIS — R202 Paresthesia of skin: Secondary | ICD-10-CM

## 2020-09-28 NOTE — Progress Notes (Signed)
Office Visit Note   Patient: Taylor Bennett           Date of Birth: 01/29/1981           MRN: [id number] Visit Date: 09/28/2020              Requested by: Lennie Odor, PA [address] [place],  [place] [id number] PCP: Lennie Odor, PA   Assessment & Plan: Visit Diagnoses:  1. Low back pain, unspecified back pain laterality, unspecified chronicity, unspecified whether sciatica present   2. Carpal tunnel syndrome, left upper limb   3. Cervicalgia   4. Carpal tunnel syndrome, right upper limb   5. Numbness and tingling of both upper extremities   6. Cubital tunnel syndrome on left   7. Spondylosis without myelopathy or radiculopathy, cervical region   8. Pain in left ankle and joints of left foot     Plan:  1. Low back pain, unspecified back pain laterality, unspecified chronicity, unspecified whether sciatica present   2. Cervicalgia   3. Numbness and tingling of both upper extremities   4. Carpal tunnel syndrome, left upper limb   5. Carpal tunnel syndrome, right upper limb   6. Cubital tunnel syndrome on left   7. Spondylosis without myelopathy or radiculopathy, cervical region     Plan: Avoid frequent bending and stooping  No lifting greater than 10 lbs. May use ice or moist heat for pain. Weight loss is of benefit. Best medication for lumbar disc disease is arthritis medications like motrin, celebrex and naprosyn. Exercise is important to improve your indurance and does allow people to function better inspite of back pain. Avoid overhead lifting and overhead use of the arms. Do not lift greater than 5 lbs. Adjust head rest in vehicle to prevent hyperextension if rear ended. Take extra precautions to avoid falling. Carpal Tunnel Syndrome  Carpal tunnel syndrome is a condition that causes pain in your hand and arm. The carpal tunnel is a narrow area located on the palm side of your wrist. Repeated wrist motion or certain diseases may cause  swelling within the tunnel. This swelling pinches the main nerve in the wrist (median nerve). What are the causes? This condition may be caused by:  Repeated wrist motions.  Wrist injuries.  Arthritis.  A cyst or tumor in the carpal tunnel.  Fluid buildup during pregnancy. Sometimes the cause of this condition is not known. What increases the risk? This condition is more likely to develop in:  People who have jobs that cause them to repeatedly move their wrists in the same motion, such as Art gallery manager.  Women.  People with certain conditions, such as: ? Diabetes. ? Obesity. ? An underactive thyroid (hypothyroidism). ? Kidney failure. What are the signs or symptoms? Symptoms of this condition include:  A tingling feeling in your fingers, especially in your thumb, index, and middle fingers.  Tingling or numbness in your hand.  An aching feeling in your entire arm, especially when your wrist and elbow are bent for long periods of time.  Wrist pain that goes up your arm to your shoulder.  Pain that goes down into your palm or fingers.  A weak feeling in your hands. You may have trouble grabbing and holding items. Your symptoms may feel worse during the night. How is this diagnosed? This condition is diagnosed with a medical history and physical exam. You may also have tests, including:  An electromyogram (EMG). This test measures  electrical signals sent by your nerves into the muscles.  X-rays. How is this treated? Treatment for this condition includes:  Lifestyle changes. It is important to stop doing or modify the activity that caused your condition.  Physical or occupational therapy.  Medicines for pain and inflammation. This may include medicine that is injected into your wrist.  A wrist splint.  Surgery. Follow these instructions at home: If you have a splint:  Wear it as told by your health care provider. Remove it only as told by your health  care provider.  Loosen the splint if your fingers become numb and tingle, or if they turn cold and blue.  Keep the splint clean and dry. General instructions  Take over-the-counter and prescription medicines only as told by your health care provider.  Rest your wrist from any activity that may be causing your pain. If your condition is work related, talk to your employer about changes that can be made, such as getting a wrist pad to use while typing.  If directed, apply ice to the painful area: ? Put ice in a plastic bag. ? Place a towel between your skin and the bag. ? Leave the ice on for 20 minutes, 2-3 times per day.  Keep all follow-up visits as told by your health care provider. This is important.  Do any exercises as told by your health care provider, physical therapist, or occupational therapist. Contact a health care provider if:  You have new symptoms.  Your pain is not controlled with medicines.  Your symptoms get worse. This information is not intended to replace advice given to you by your health care provider. Make sure you discuss any questions you have with your health care provider. Document Released: 08/24/2000 Document Revised: 01/05/2016 Document Reviewed: 05/08/2017 Elsevier Interactive Patient Education  2017 [employer]  Gabapentin 100 mg po Qhs Celebrex 100 mg BID. Stop the motrin if you are taking celebrex. Follow-Up Instructions: No follow-ups on file.  Presently her exam and history suggest mechanical neck and low back pain that is due to spondylosis and degenerative disc disease. She has symptoms of numbness and tingling in the arms that may relate to compressive neuropathy at the elbow and wrist and possibly  Due to cervical spondylosis or both. There is no focal neurologic deficit and she does not consider the severity to be bad enough nor Do I to warrant EMG/NCV. The affect of hypo or hyper thyroidism may also impact the sensory symptoms in her  arms. She has findings of degenerative disc disease of the lumbar spine. Use of NSAIDs intermittantly is recommended and a Home Exercise  Program to bind endurance to these conditions. She likely will experience pain in the lumbar spine and from the cervical spine that will Require 2-3 days to recover from due to exacerbation of degenertive disc disease. It is appropriate to use FLMA for these periods of dysfunction. A surgical solution at this time is not recommended as the pain due to ddd ansdspondylosis does not cause motor or sensory Deficit.    Follow-Up Instructions: No follow-ups on file.   Orders:  No orders of the defined types were placed in this encounter.  No orders of the defined types were placed in this encounter.     Procedures: No procedures performed   Clinical Data: No additional findings.   Subjective: Chief Complaint  Patient presents with  . Lower Back - Follow-up    40 year old female with history of RA newly  diagnosed, having episodic pain in the low back and sciatica. Bilateral hand numbness and tingling. Last seen  One month ago and recommended the use of gabapentin at night, change from motrin to celebrex. She reports she has not tried eight medication. A prescription for bilateral wrist  Splints to use with episodic carpal tunnel symptoms, she reports that she has misplaced the prescriptions and requests new prescriptions. She does not feel as though there has been A worsening of her discomfort and does not feel a need to change her medications and remains on intermittant skelaxin and motrin. No new complaints.    Review of Systems  HENT: Negative.   Eyes: Negative.   Respiratory: Negative.   Cardiovascular: Negative.   Gastrointestinal: Negative.   Endocrine: Negative.   Genitourinary: Negative.   Musculoskeletal: Positive for back pain.  Allergic/Immunologic: Negative.   Neurological: Positive for numbness.  Hematological: Negative.    Psychiatric/Behavioral: Negative.      Objective: Vital Signs: BP 107/75   Pulse 65   Ht 5\' 3"  (1.6 m)   Wt 199 lb (90.3 kg)   BMI 35.25 kg/m   Physical Exam Constitutional:      Appearance: She is well-developed and well-nourished.  HENT:     Head: Normocephalic and atraumatic.  Eyes:     Extraocular Movements: EOM normal.     Pupils: Pupils are equal, round, and reactive to light.  Pulmonary:     Effort: Pulmonary effort is normal.     Breath sounds: Normal breath sounds.  Abdominal:     General: Bowel sounds are normal.     Palpations: Abdomen is soft.  Musculoskeletal:        General: Normal range of motion.     Cervical back: Normal range of motion and neck supple.  Skin:    General: Skin is warm and dry.  Neurological:     Mental Status: She is alert and oriented to person, place, and time.  Psychiatric:        Mood and Affect: Mood and affect normal.        Behavior: Behavior normal.        Thought Content: Thought content normal.        Judgment: Judgment normal.     Ortho Exam  Specialty Comments:  No specialty comments available.  Imaging: No results found.   PMFS History: Patient Active Problem List   Diagnosis Date Noted  . Paresthesia 11/30/2016  . Neck pain 10/23/2016  . Left cervical radiculopathy 10/23/2016  . Vitamin D deficiency 08/10/2016  . PCOS (polycystic ovarian syndrome) 06/16/2013  . Migraines 06/16/2013  . Hypothyroidism 06/10/2013   Past Medical History:  Diagnosis Date  . Back pain   . Cervical pain   . PCOS (polycystic ovarian syndrome)   . Scoliosis   . Thyroid disease     Family History  Problem Relation Age of Onset  . Thyroid disease Mother   . Arthritis Mother   . Diabetes Father   . Congestive Heart Failure Father   . Alcoholism Father   . Neuropathy Father   . Arthritis Father   . Osteoarthritis Maternal Aunt   . Rheum arthritis Paternal Grandmother   . Healthy Daughter   . Polycystic ovary syndrome  Daughter     Past Surgical History:  Procedure Laterality Date  . ANKLE SURGERY  2009   ORIF   . THYROIDECTOMY    . WISDOM TOOTH EXTRACTION     Social History   Occupational  History  . Occupation: ED - Admissions  Tobacco Use  . Smoking status: Former Smoker    Start date: 10/13/2011  . Smokeless tobacco: Never Used  Vaping Use  . Vaping Use: Never used  Substance and Sexual Activity  . Alcohol use: Yes    Comment: occ  . Drug use: No  . Sexual activity: Not on file

## 2020-09-28 NOTE — Patient Instructions (Addendum)
1. Low back pain, unspecified back pain laterality, unspecified chronicity, unspecified whether sciatica present   2. Cervicalgia   3. Numbness and tingling of both upper extremities   4. Carpal tunnel syndrome, left upper limb   5. Carpal tunnel syndrome, right upper limb   6. Cubital tunnel syndrome on left   7. Spondylosis without myelopathy or radiculopathy, cervical region     Plan: Avoid frequent bending and stooping  No lifting greater than 10 lbs. May use ice or moist heat for pain. Weight loss is of benefit. Best medication for lumbar disc disease is arthritis medications like motrin, celebrex and naprosyn. Exercise is important to improve your indurance and does allow people to function better inspite of back pain. Avoid overhead lifting and overhead use of the arms. Do not lift greater than 5 lbs. Adjust head rest in vehicle to prevent hyperextension if rear ended. Take extra precautions to avoid falling. Carpal Tunnel Syndrome  Carpal tunnel syndrome is a condition that causes pain in your hand and arm. The carpal tunnel is a narrow area located on the palm side of your wrist. Repeated wrist motion or certain diseases may cause swelling within the tunnel. This swelling pinches the main nerve in the wrist (median nerve). What are the causes? This condition may be caused by:  Repeated wrist motions.  Wrist injuries.  Arthritis.  A cyst or tumor in the carpal tunnel.  Fluid buildup during pregnancy. Sometimes the cause of this condition is not known. What increases the risk? This condition is more likely to develop in:  People who have jobs that cause them to repeatedly move their wrists in the same motion, such as Art gallery manager.  Women.  People with certain conditions, such as: ? Diabetes. ? Obesity. ? An underactive thyroid (hypothyroidism). ? Kidney failure. What are the signs or symptoms? Symptoms of this condition include:  A tingling feeling  in your fingers, especially in your thumb, index, and middle fingers.  Tingling or numbness in your hand.  An aching feeling in your entire arm, especially when your wrist and elbow are bent for long periods of time.  Wrist pain that goes up your arm to your shoulder.  Pain that goes down into your palm or fingers.  A weak feeling in your hands. You may have trouble grabbing and holding items. Your symptoms may feel worse during the night. How is this diagnosed? This condition is diagnosed with a medical history and physical exam. You may also have tests, including:  An electromyogram (EMG). This test measures electrical signals sent by your nerves into the muscles.  X-rays. How is this treated? Treatment for this condition includes:  Lifestyle changes. It is important to stop doing or modify the activity that caused your condition.  Physical or occupational therapy.  Medicines for pain and inflammation. This may include medicine that is injected into your wrist.  A wrist splint.  Surgery. Follow these instructions at home: If you have a splint:  Wear it as told by your health care provider. Remove it only as told by your health care provider.  Loosen the splint if your fingers become numb and tingle, or if they turn cold and blue.  Keep the splint clean and dry. General instructions  Take over-the-counter and prescription medicines only as told by your health care provider.  Rest your wrist from any activity that may be causing your pain. If your condition is work related, talk to your employer about changes that  can be made, such as getting a wrist pad to use while typing.  If directed, apply ice to the painful area: ? Put ice in a plastic bag. ? Place a towel between your skin and the bag. ? Leave the ice on for 20 minutes, 2-3 times per day.  Keep all follow-up visits as told by your health care provider. This is important.  Do any exercises as told by your  health care provider, physical therapist, or occupational therapist. Contact a health care provider if:  You have new symptoms.  Your pain is not controlled with medicines.  Your symptoms get worse. This information is not intended to replace advice given to you by your health care provider. Make sure you discuss any questions you have with your health care provider. Document Released: 08/24/2000 Document Revised: 01/05/2016 Document Reviewed: 05/08/2017 Elsevier Interactive Patient Education  2017 [employer]  Gabapentin 100 mg po Qhs Celebrex 100 mg BID. Stop the motrin if you are taking celebrex. Follow-Up Instructions: No follow-ups on file.  Presently her exam and history suggest mechanical neck and low back pain that is due to spondylosis and degenerative disc disease. She has symptoms of numbness and tingling in the arms that may relate to compressive neuropathy at the elbow and wrist and possibly  Due to cervical spondylosis or both. There is no focal neurologic deficit and she does not consider the severity to be bad enough nor Do I to warrant EMG/NCV. The affect of hypo or hyper thyroidism may also impact the sensory symptoms in her arms. She has findings of degenerative disc disease of the lumbar spine. Use of NSAIDs intermittantly is recommended and a Home Exercise  Program to bind endurance to these conditions. She likely will experience pain in the lumbar spine and from the cervical spine that will Require 2-3 days to recover from due to exacerbation of degenertive disc disease. It is appropriate to use FLMA for these periods of dysfunction. A surgical solution at this time is not recommended as the pain due to ddd ansdspondylosis does not cause motor or sensory Deficit.              7/16th inch heel lift to use in the left shoe due to leg length difference of 74mm measured on radiographs from 06/2016

## 2020-10-03 ENCOUNTER — Encounter: Payer: Self-pay | Admitting: Internal Medicine

## 2020-10-04 ENCOUNTER — Other Ambulatory Visit: Payer: Self-pay

## 2020-10-14 NOTE — Progress Notes (Signed)
Office Visit Note  Patient: Taylor Bennett             Date of Birth: 06-22-1981           MRN: [id number]             PCP: Lennie Odor, PA Referring: Lennie Odor, PA Visit Date: 10/27/2020 Occupation: @GUAROCC @  Subjective:  Joint stiffness   History of Present Illness: Taylor Bennett is a 40 y.o. female with history of seropositive rheumatoid arthritis, osteoarthritis, myofascial pain, and DDD.  Patient is taking Plaquenil 200 mg 1 tablet by mouth twice daily Monday through Friday.  Holding Plaquenil without any side effects.  Patient reports that she has been having (Hgb she started on Plaquenil in September 2021.  She reports that she continues to have pain and stiffness in both knee joints especially after sitting for prolonged periods of time.  She is having discomfort on the plantar aspect of both feet which is typically worse first thing in the morning or after being sedentary.  She denies any joint swelling currently.  She continues to have intermittent neck and lower back pain.  She is followed closely by Dr. Louanne Skye.  She takes Skelaxin and 100 mg of ibuprofen as needed for symptomatic relief.  She continues to have generalized myalgias and muscle tenderness due to myofascial pain syndrome.     Activities of Daily Living:  Patient reports morning stiffness for all day.   Patient Reports nocturnal pain.  Difficulty dressing/grooming: Reports Difficulty climbing stairs: Reports Difficulty getting out of chair: Reports Difficulty using hands for taps, buttons, cutlery, and/or writing: Denies  Review of Systems  Constitutional: Positive for fatigue.  HENT: Positive for mouth dryness and nose dryness. Negative for mouth sores.   Eyes: Negative for pain, itching and dryness.  Respiratory: Positive for shortness of breath. Negative for difficulty breathing.   Cardiovascular: Negative for chest pain and palpitations.  Gastrointestinal: Negative for blood in stool,  constipation and diarrhea.  Endocrine: Negative for increased urination.  Genitourinary: Negative for difficulty urinating.  Musculoskeletal: Positive for arthralgias, joint pain, joint swelling, myalgias, morning stiffness, muscle tenderness and myalgias.  Skin: Negative for color change, rash and redness.  Allergic/Immunologic: Negative for susceptible to infections.  Neurological: Positive for dizziness, numbness, headaches, memory loss and weakness.  Hematological: Negative for bruising/bleeding tendency.  Psychiatric/Behavioral: Negative for confusion.    PMFS History:  Patient Active Problem List   Diagnosis Date Noted  . Paresthesia 11/30/2016  . Neck pain 10/23/2016  . Left cervical radiculopathy 10/23/2016  . Vitamin D deficiency 08/10/2016  . PCOS (polycystic ovarian syndrome) 06/16/2013  . Migraines 06/16/2013  . Hypothyroidism 06/10/2013    Past Medical History:  Diagnosis Date  . Back pain   . Cervical pain   . PCOS (polycystic ovarian syndrome)   . Scoliosis   . Thyroid disease     Family History  Problem Relation Age of Onset  . Thyroid disease Mother   . Arthritis Mother   . Diabetes Father   . Congestive Heart Failure Father   . Alcoholism Father   . Neuropathy Father   . Arthritis Father   . Osteoarthritis Maternal Aunt   . Rheum arthritis Paternal Grandmother   . Healthy Daughter   . Polycystic ovary syndrome Daughter    Past Surgical History:  Procedure Laterality Date  . ANKLE SURGERY  2009   ORIF   . THYROIDECTOMY    . WISDOM TOOTH EXTRACTION  Social History   Social History Narrative   Lives at home with her fiance and children.   Right-handed.   1-2 cup caffeine daily.   Immunization History  Administered Date(s) Administered  . Influenza-Unspecified 06/03/2014, 06/20/2015  . Moderna Sars-Covid-2 Vaccination 10/02/2019, 11/02/2019, 07/09/2020     Objective: Vital Signs: BP 114/80 (BP Location: Left Arm, Patient Position:  Sitting, Cuff Size: Normal)   Pulse 90   Resp 14   Ht 5\' 3"  (1.6 m)   Wt 198 lb 3.2 oz (89.9 kg)   BMI 35.11 kg/m    Physical Exam Vitals and nursing note reviewed.  Constitutional:      Appearance: She is well-developed and well-nourished.  HENT:     Head: Normocephalic and atraumatic.  Eyes:     Extraocular Movements: EOM normal.     Conjunctiva/sclera: Conjunctivae normal.  Cardiovascular:     Pulses: Intact distal pulses.  Pulmonary:     Effort: Pulmonary effort is normal.  Abdominal:     Palpations: Abdomen is soft.  Musculoskeletal:     Cervical back: Normal range of motion.  Skin:    General: Skin is warm and dry.     Capillary Refill: Capillary refill takes less than 2 seconds.  Neurological:     Mental Status: She is alert and oriented to person, place, and time.  Psychiatric:        Mood and Affect: Mood and affect normal.        Behavior: Behavior normal.      Musculoskeletal Exam: Generalized hyperalgesia and positive tender points.  C-spine has good range of motion.  Trapezius muscle tension and muscle tenderness bilaterally.  Painful range of motion of lumbar spine.  Shoulder joints, elbow joints, wrist joints, MCPs, PIPs, and DIPs have good range of motion with no synovitis.  She is able to make a complete fist bilaterally.  She has tenderness over the left first PIP joint.  Hip joints have good range of motion with no discomfort.  She has tenderness palpation over bilateral trochanteric bursa.  Knee joints have good range of motion with no warmth or effusion.  Ankle joints have good range of motion with no tenderness or inflammation.  She has tenderness along the plantar fascia of both feet, right greater than left.  No tenderness of MTP joints.  CDAI Exam: CDAI Score: 0.4  Patient Global: 2 mm; Provider Global: 2 mm Swollen: 0 ; Tender: 0  Joint Exam 10/27/2020   No joint exam has been documented for this visit   There is currently no information  documented on the homunculus. Go to the Rheumatology activity and complete the homunculus joint exam.  Investigation: No additional findings.  Imaging: No results found.  Recent Labs: Lab Results  Component Value Date   WBC 6.4 07/27/2020   HGB 13.8 07/27/2020   PLT 392 07/27/2020   NA 140 07/27/2020   K 4.0 07/27/2020   CL 104 07/27/2020   CO2 28 07/27/2020   GLUCOSE 108 07/27/2020   BUN 13 07/27/2020   CREATININE 0.73 07/27/2020   BILITOT 0.4 07/27/2020   ALKPHOS 58 07/19/2015   AST 17 07/27/2020   ALT 12 07/27/2020   PROT 6.9 07/27/2020   ALBUMIN 3.6 07/19/2015   CALCIUM 9.6 07/27/2020   GFRAA 119 07/27/2020    Speciality Comments: PLQ EYe Exam: 07/28/2020 WNL @ [facility].   Procedures:  No procedures performed Allergies: Patient has no known allergies.    Assessment / Plan:  Visit Diagnoses: Seropositive rheumatoid arthritis ([place]) - +14 3 3  eta, elevated sedimentation rate in March 2021, +synovitis on ultrasound: She has no synovitis on examination today.  She has not had any recent rheumatoid arthritis flares.  She is clinically doing well taking Plaquenil 200 mg 1 tablet by mouth twice daily Monday through Friday.  She continues to tolerate Plaquenil without any side effects.  She experiences intermittent pain and stiffness in both knee joints.  On examination today both knee joints have good range of motion with no warmth or effusion.  She continues have generalized myalgias and muscle tenderness due to underlying myofascial pain syndrome.  Overall her rheumatoid arthritis seems well controlled.  She will continue taking Plaquenil as prescribed.  She was advised to notify us if she develops increased joint pain or joint swelling.  She will follow-up in the office in 5 months.  High risk medication use - Plaquenil 200 mg twice daily Monday through Friday. PLQ Eye Exam: 07/28/2020. CBC and CMP WNL on 07/27/20.  She will be due to update lab work in  April and every 5 months. Standing orders for CBC and CMP placed today.  - Plan: CBC with Differential/Platelet, COMPLETE METABOLIC PANEL WITH GFR She has not had any recent infections.  Primary osteoarthritis of both hands: She has no joint tenderness or synovitis on exam.  She is able to make a complete fist bilaterally.  We discussed the importance of joint protection and muscle strengthening.  She can use Voltaren gel topically as needed for pain relief.  Chondromalacia patellae, left knee: She has good range of motion of the left knee joint on examination today.  No warmth or effusion was noted.  No crepitus was noted at this time.  Primary osteoarthritis of both feet: She has been experiencing increased discomfort in both feet due to plantar fasciitis bilaterally.  She has no tenderness of MTP or PIP joints.  Ankle joints have good range of motion with no tenderness or inflammation.  We discussed the importance of wearing proper fitting shoes.   Chronic SI joint pain: She has no SI joint discomfort at this time.  DDD (degenerative disc disease), cervical: Chronic pain.  She has good range of motion of the C-spine on exam.  She has trapezius muscle tension and muscle tenderness bilaterally.  She is followed closely by Dr. Louanne Skye.  She had updated x-rays of the C-spine on 08/17/2020.   Other idiopathic scoliosis, thoracic region: Chronic pain  DDD (degenerative disc disease), lumbar -Chronic pain.  Followed by Dr. Louanne Skye.  She had updated x-rays on 08/17/2020.  We discussed the importance of core strengthening and lower back exercises.  We also discussed the use of a foam roller.  She plans on continuing to take Skelaxin and ibuprofen as needed for pain relief.  Myofascial pain: She has generalized hyperalgesia and positive tender points on examination today.  She experiences intermittent myalgias and muscle tenderness due to myofascial pain syndrome.  She has trapezius muscle tension and muscle  tenderness bilaterally.  She has tenderness palpation of her bilateral trochanteric bursa.  She was given a handout of exercises to perform.  We discussed the importance of regular exercise and good sleep hygiene.  Trochanteric bursitis of both hips: She has tenderness palpation over bilateral trochanteric bursa.  Her discomfort is exacerbated by lying on her sides at night.  We discussed the importance of changing positions frequently throughout the day so she is not sitting for prolonged periods of time.  She was given a handout of exercises to perform.  Several stretching exercises were demonstrated today in the office.  We also discussed the use of a foam roller.   Plantar fasciitis, bilateral: She has been experiencing intermittent pain on the plantar aspect of both feet for the past several months.  She has tenderness along the plantar fascia bilaterally, R>L.  Different treatment options were discussed today in detail.  She was given a handout of exercises to perform.  We discussed that if her symptoms persist or worsen physical therapy as well as a plantar fascial boot or options.  If her pain is severe she can return for a cortisone injection in the future.  We discussed the importance of wearing proper fitting shoes with support.  Family history of rheumatoid arthritis  Vitamin D deficiency  PCOS (polycystic ovarian syndrome)  Hx of migraines  Postoperative hypothyroidism  Orders: Orders Placed This Encounter  Procedures  . CBC with Differential/Platelet  . COMPLETE METABOLIC PANEL WITH GFR   No orders of the defined types were placed in this encounter.     Follow-Up Instructions: Return in about 5 months (around 03/26/2021) for Rheumatoid arthritis, Osteoarthritis, DDD.   Ofilia Neas, PA-C  Note - This record has been created using Dragon software.  Chart creation errors have been sought, but may not always  have been located. Such creation errors do not reflect on  the  standard of medical care.

## 2020-10-27 ENCOUNTER — Other Ambulatory Visit: Payer: Self-pay

## 2020-10-27 ENCOUNTER — Ambulatory Visit: Payer: 59 | Admitting: Physician Assistant

## 2020-10-27 ENCOUNTER — Encounter: Payer: Self-pay | Admitting: Physician Assistant

## 2020-10-27 VITALS — BP 114/80 | HR 90 | Resp 14 | Ht 63.0 in | Wt 198.2 lb

## 2020-10-27 DIAGNOSIS — M5136 Other intervertebral disc degeneration, lumbar region: Secondary | ICD-10-CM | POA: Diagnosis not present

## 2020-10-27 DIAGNOSIS — M19071 Primary osteoarthritis, right ankle and foot: Secondary | ICD-10-CM

## 2020-10-27 DIAGNOSIS — M4124 Other idiopathic scoliosis, thoracic region: Secondary | ICD-10-CM

## 2020-10-27 DIAGNOSIS — Z79899 Other long term (current) drug therapy: Secondary | ICD-10-CM

## 2020-10-27 DIAGNOSIS — M059 Rheumatoid arthritis with rheumatoid factor, unspecified: Secondary | ICD-10-CM | POA: Diagnosis not present

## 2020-10-27 DIAGNOSIS — M19041 Primary osteoarthritis, right hand: Secondary | ICD-10-CM

## 2020-10-27 DIAGNOSIS — M2242 Chondromalacia patellae, left knee: Secondary | ICD-10-CM

## 2020-10-27 DIAGNOSIS — M7918 Myalgia, other site: Secondary | ICD-10-CM

## 2020-10-27 DIAGNOSIS — M533 Sacrococcygeal disorders, not elsewhere classified: Secondary | ICD-10-CM | POA: Diagnosis not present

## 2020-10-27 DIAGNOSIS — E282 Polycystic ovarian syndrome: Secondary | ICD-10-CM

## 2020-10-27 DIAGNOSIS — M5030 Other cervical disc degeneration, unspecified cervical region: Secondary | ICD-10-CM

## 2020-10-27 DIAGNOSIS — E890 Postprocedural hypothyroidism: Secondary | ICD-10-CM

## 2020-10-27 DIAGNOSIS — G8929 Other chronic pain: Secondary | ICD-10-CM

## 2020-10-27 DIAGNOSIS — E559 Vitamin D deficiency, unspecified: Secondary | ICD-10-CM

## 2020-10-27 DIAGNOSIS — M19072 Primary osteoarthritis, left ankle and foot: Secondary | ICD-10-CM

## 2020-10-27 DIAGNOSIS — M722 Plantar fascial fibromatosis: Secondary | ICD-10-CM

## 2020-10-27 DIAGNOSIS — M7061 Trochanteric bursitis, right hip: Secondary | ICD-10-CM

## 2020-10-27 DIAGNOSIS — Z8261 Family history of arthritis: Secondary | ICD-10-CM

## 2020-10-27 DIAGNOSIS — Z8669 Personal history of other diseases of the nervous system and sense organs: Secondary | ICD-10-CM

## 2020-10-27 DIAGNOSIS — M19042 Primary osteoarthritis, left hand: Secondary | ICD-10-CM

## 2020-10-27 DIAGNOSIS — M7062 Trochanteric bursitis, left hip: Secondary | ICD-10-CM

## 2020-10-27 NOTE — Patient Instructions (Addendum)
Standing Labs We placed an order today for your standing lab work.   Please have your standing labs drawn in April and every 5 months   If possible, please have your labs drawn 2 weeks prior to your appointment so that the provider can discuss your results at your appointment.  We have open lab daily Monday through Thursday from 1:30-4:30 PM and Friday from 1:30-4:00 PM at the office of Dr. Bo Merino, [facility].   Please be advised, all patients with office appointments requiring lab work will take precedents over walk-in lab work.  If possible, please come for your lab work on Monday and Friday afternoons, as you may experience shorter wait times. The office is located at [address] [place], [place], [place] [address] No appointment is necessary.   Labs are drawn by [facility]. Please bring your co-pay at the time of your lab draw.  You may receive a bill from [facility] for your lab work.  If you wish to have your labs drawn at another location, please call the office 24 hours in advance to send orders.  If you have any questions regarding directions or hours of operation,  please call [phone].   As a reminder, please drink plenty of water prior to coming for your lab work. Thanks!   Hip Bursitis Rehab Ask your health care provider which exercises are safe for you. Do exercises exactly as told by your health care provider and adjust them as directed. It is normal to feel mild stretching, pulling, tightness, or discomfort as you do these exercises. Stop right away if you feel sudden pain or your pain gets worse. Do not begin these exercises until told by your health care provider. Stretching exercise This exercise warms up your muscles and joints and improves the movement and flexibility of your hip. This exercise also helps to relieve pain and stiffness. Iliotibial band stretch An iliotibial band is a strong band of muscle tissue that runs from the outer side of  your hip to the outer side of your thigh and knee. 1. Lie on your side with your left / right leg in the top position. 2. Bend your left / right knee and grab your ankle. Stretch out your bottom arm to help you balance. 3. Slowly bring your knee back so your thigh is behind your body. 4. Slowly lower your knee toward the floor until you feel a gentle stretch on the outside of your left / right thigh. If you do not feel a stretch and your knee will not fall farther, place the heel of your other foot on top of your knee and pull your knee down toward the floor with your foot. 5. Hold this position for __________ seconds. 6. Slowly return to the starting position. Repeat __________ times. Complete this exercise __________ times a day.   Strengthening exercises These exercises build strength and endurance in your hip and pelvis. Endurance is the ability to use your muscles for a long time, even after they get tired. Bridge This exercise strengthens the muscles that move your thigh backward (hip extensors). 1. Lie on your back on a firm surface with your knees bent and your feet flat on the floor. 2. Tighten your buttocks muscles and lift your buttocks off the floor until your trunk is level with your thighs. ? Do not arch your back. ? You should feel the muscles working in your buttocks and the back of your thighs. If you do not feel  these muscles, slide your feet 1-2 inches (2.5-5 cm) farther away from your buttocks. ? If this exercise is too easy, try doing it with your arms crossed over your chest. 3. Hold this position for __________ seconds. 4. Slowly lower your hips to the starting position. 5. Let your muscles relax completely after each repetition. Repeat __________ times. Complete this exercise __________ times a day.   Squats This exercise strengthens the muscles in front of your thigh and knee (quadriceps). 1. Stand in front of a table, with your feet and knees pointing straight ahead.  You may rest your hands on the table for balance but not for support. 2. Slowly bend your knees and lower your hips like you are going to sit in a chair. ? Keep your weight over your heels, not over your toes. ? Keep your lower legs upright so they are parallel with the table legs. ? Do not let your hips go lower than your knees. ? Do not bend lower than told by your health care provider. ? If your hip pain increases, do not bend as low. 3. Hold the squat position for __________ seconds. 4. Slowly push with your legs to return to standing. Do not use your hands to pull yourself to standing. Repeat __________ times. Complete this exercise __________ times a day. Hip hike 1. Stand sideways on a bottom step. Stand on your left / right leg with your other foot unsupported next to the step. You can hold on to the railing or wall for balance if needed. 2. Keep your knees straight and your torso square. Then lift your left / right hip up toward the ceiling. 3. Hold this position for __________ seconds. 4. Slowly let your left / right hip lower toward the floor, past the starting position. Your foot should get closer to the floor. Do not lean or bend your knees. Repeat __________ times. Complete this exercise __________ times a day. Single leg stand 1. Without shoes, stand near a railing or in a doorway. You may hold on to the railing or door frame as needed for balance. 2. Squeeze your left / right buttock muscles, then lift up your other foot. ? Do not let your left / right hip push out to the side. ? It is helpful to stand in front of a mirror for this exercise so you can watch your hip. 3. Hold this position for __________ seconds. Repeat __________ times. Complete this exercise __________ times a day. This information is not intended to replace advice given to you by your health care provider. Make sure you discuss any questions you have with your health care provider. Document Revised:  12/22/2018 Document Reviewed: 12/22/2018 Elsevier Patient Education  [place].  Plantar Fasciitis Rehab Ask your health care provider which exercises are safe for you. Do exercises exactly as told by your health care provider and adjust them as directed. It is normal to feel mild stretching, pulling, tightness, or discomfort as you do these exercises. Stop right away if you feel sudden pain or your pain gets worse. Do not begin these exercises until told by your health care provider. Stretching and range-of-motion exercises These exercises warm up your muscles and joints and improve the movement and flexibility of your foot. These exercises also help to relieve pain. Plantar fascia stretch 1. Sit with your left / right leg crossed over your opposite knee. 2. Hold your heel with one hand with that thumb near your arch. With your other  hand, hold your toes and gently pull them back toward the top of your foot. You should feel a stretch on the base (bottom) of your toes, or the bottom of your foot (plantar fascia), or both. 3. Hold this stretch for__________ seconds. 4. Slowly release your toes and return to the starting position. Repeat __________ times. Complete this exercise __________ times a day.   Gastrocnemius stretch, standing This exercise is also called a calf (gastroc) stretch. It stretches the muscles in the back of the upper calf. 1. Stand with your hands against a wall. 2. Extend your left / right leg behind you, and bend your front knee slightly. 3. Keeping your heels on the floor, your toes facing forward, and your back knee straight, shift your weight toward the wall. Do not arch your back. You should feel a gentle stretch in your upper calf. 4. Hold this position for __________ seconds. Repeat __________ times. Complete this exercise __________ times a day.   Soleus stretch, standing This exercise is also called a calf (soleus) stretch. It stretches the muscles in the  back of the lower calf. 1. Stand with your hands against a wall. 2. Extend your left / right leg behind you, and bend your front knee slightly. 3. Keeping your heels on the floor and your toes facing forward, bend your back knee and shift your weight slightly over your back leg. You should feel a gentle stretch deep in your lower calf. 4. Hold this position for __________ seconds. Repeat __________ times. Complete this exercise __________ times a day. Gastroc and soleus stretch, standing step This exercise stretches the muscles in the back of the lower leg. These muscles are in the upper calf (gastrocnemius) and the lower calf (soleus). 1. Stand with the ball of your left / right foot on the front of a step. The ball of your foot is on the walking surface, right under your toes. 2. Keep your other foot firmly on the same step. 3. Hold on to the wall or a railing for balance. 4. Slowly lift your other foot, allowing your body weight to press your heel down over the edge of the front of the step. Keep knee straight and unbent. You should feel a stretch in your calf. 5. Hold this position for __________ seconds. 6. Return both feet to the step. 7. Repeat this exercise with a slight bend in your left / right knee. Repeat __________ times with your left / right knee straight and __________ times with your left / right knee bent. Complete this exercise __________ times a day. Balance exercise This exercise builds your balance and strength control of your arch to help take pressure off your plantar fascia. Single leg stand If this exercise is too easy, you can try it with your eyes closed or while standing on a pillow. 1. Without shoes, stand near a railing or in a doorway. You may hold on to the railing or door frame as needed. 2. Stand on your left / right foot. Keep your big toe down on the floor and lift the arch of your foot. You should feel a stretch across the bottom of your foot and your arch.  Do not let your foot roll inward. 3. Hold this position for __________ seconds. Repeat __________ times. Complete this exercise __________ times a day. This information is not intended to replace advice given to you by your health care provider. Make sure you discuss any questions you have with your health care  provider. Document Revised: 06/09/2020 Document Reviewed: 06/09/2020 Elsevier Patient Education  [place].

## 2020-11-02 DIAGNOSIS — F3281 Premenstrual dysphoric disorder: Secondary | ICD-10-CM | POA: Diagnosis not present

## 2020-11-02 DIAGNOSIS — R102 Pelvic and perineal pain: Secondary | ICD-10-CM | POA: Diagnosis not present

## 2020-11-02 DIAGNOSIS — R55 Syncope and collapse: Secondary | ICD-10-CM | POA: Diagnosis not present

## 2020-11-03 ENCOUNTER — Encounter: Payer: Self-pay | Admitting: Rheumatology

## 2020-11-03 ENCOUNTER — Encounter: Payer: Self-pay | Admitting: Internal Medicine

## 2020-11-03 DIAGNOSIS — N920 Excessive and frequent menstruation with regular cycle: Secondary | ICD-10-CM | POA: Diagnosis not present

## 2020-11-07 ENCOUNTER — Other Ambulatory Visit (HOSPITAL_COMMUNITY): Payer: Self-pay | Admitting: Nurse Practitioner

## 2020-11-07 MED FILL — FEMYNOR 0.25-35 MG-MCG TABS: 0.25-35 | 84 days supply | Qty: 84 | Fill #0

## 2020-11-14 DIAGNOSIS — Z0000 Encounter for general adult medical examination without abnormal findings: Secondary | ICD-10-CM | POA: Diagnosis not present

## 2020-11-25 ENCOUNTER — Other Ambulatory Visit: Payer: Self-pay | Admitting: Rheumatology

## 2020-11-25 ENCOUNTER — Other Ambulatory Visit: Payer: Self-pay | Admitting: Physician Assistant

## 2020-11-25 NOTE — Telephone Encounter (Signed)
Last Visit: 10/27/2020 Next Visit: 03/30/2021 Labs: 07/27/2020, CBC and CMP are normal Eye exam: 07/28/2020  Current Dose per office note 10/27/2020, Plaquenil 200 mg twice daily Monday through Friday [id number] rheumatoid arthritis   Last Fill: 08/17/2020  Okay to refill Plaquenil?

## 2020-12-07 ENCOUNTER — Other Ambulatory Visit (HOSPITAL_COMMUNITY): Payer: Self-pay | Admitting: Physician Assistant

## 2020-12-08 ENCOUNTER — Other Ambulatory Visit: Payer: Self-pay

## 2020-12-08 DIAGNOSIS — Z79899 Other long term (current) drug therapy: Secondary | ICD-10-CM

## 2020-12-09 LAB — CBC WITH DIFFERENTIAL/PLATELET
Absolute Monocytes: 968 cells/uL — ABNORMAL HIGH (ref 200–950)
Basophils Absolute: 48 cells/uL (ref 0–200)
Basophils Relative: 0.6 %
Eosinophils Absolute: 64 cells/uL (ref 15–500)
Eosinophils Relative: 0.8 %
HCT: 38.8 % (ref 35.0–45.0)
Hemoglobin: 13.1 g/dL (ref 11.7–15.5)
Lymphs Abs: 1968 cells/uL (ref 850–3900)
MCH: 31.7 pg (ref 27.0–33.0)
MCHC: 33.8 g/dL (ref 32.0–36.0)
MCV: 93.9 fL (ref 80.0–100.0)
MPV: 9.7 fL (ref 7.5–12.5)
Monocytes Relative: 12.1 %
Neutro Abs: 4952 cells/uL (ref 1500–7800)
Neutrophils Relative %: 61.9 %
Platelets: 398 10*3/uL (ref 140–400)
RBC: 4.13 10*6/uL (ref 3.80–5.10)
RDW: 11.6 % (ref 11.0–15.0)
Total Lymphocyte: 24.6 %
WBC: 8 10*3/uL (ref 3.8–10.8)

## 2020-12-09 LAB — COMPLETE METABOLIC PANEL WITH GFR
AG Ratio: 1.5 (calc) (ref 1.0–2.5)
ALT: 10 U/L (ref 6–29)
AST: 15 U/L (ref 10–30)
Albumin: 3.8 g/dL (ref 3.6–5.1)
Alkaline phosphatase (APISO): 43 U/L (ref 31–125)
BUN: 13 mg/dL (ref 7–25)
CO2: 26 mmol/L (ref 20–32)
Calcium: 8.9 mg/dL (ref 8.6–10.2)
Chloride: 104 mmol/L (ref 98–110)
Creat: 0.81 mg/dL (ref 0.50–1.10)
GFR, Est African American: 104 mL/min/{1.73_m2} (ref >=60)
GFR, Est Non African American: 90 mL/min/{1.73_m2} (ref >=60)
Globulin: 2.6 g/dL (calc) (ref 1.9–3.7)
Glucose, Bld: 87 mg/dL (ref 65–99)
Potassium: 4.6 mmol/L (ref 3.5–5.3)
Sodium: 136 mmol/L (ref 135–146)
Total Bilirubin: 0.4 mg/dL (ref 0.2–1.2)
Total Protein: 6.4 g/dL (ref 6.1–8.1)

## 2020-12-09 NOTE — Progress Notes (Signed)
Absolute monocytes are borderline elevated. Rest of CBC WNL.  CMP WNL.

## 2020-12-10 ENCOUNTER — Other Ambulatory Visit (HOSPITAL_COMMUNITY): Payer: Self-pay

## 2020-12-12 ENCOUNTER — Other Ambulatory Visit (HOSPITAL_COMMUNITY): Payer: Self-pay

## 2020-12-12 DIAGNOSIS — Z30015 Encounter for initial prescription of vaginal ring hormonal contraceptive: Secondary | ICD-10-CM | POA: Diagnosis not present

## 2020-12-12 DIAGNOSIS — R350 Frequency of micturition: Secondary | ICD-10-CM | POA: Diagnosis not present

## 2020-12-12 DIAGNOSIS — N926 Irregular menstruation, unspecified: Secondary | ICD-10-CM | POA: Diagnosis not present

## 2020-12-12 MED ORDER — RIZATRIPTAN BENZOATE 10 MG PO TABS
10.0000 mg | ORAL_TABLET | ORAL | 4 refills | Status: DC
  Filled 2020-12-12: qty 6, 30d supply, fill #0
  Filled 2021-02-20: qty 6, 30d supply, fill #1

## 2020-12-12 MED ORDER — ETONOGESTREL-ETHINYL ESTRADIOL 0.12-0.015 MG/24HR VA RING
VAGINAL_RING | VAGINAL | 4 refills | Status: DC
  Filled 2020-12-12: qty 3, 84d supply, fill #0
  Filled 2021-03-15: qty 3, 84d supply, fill #1
  Filled 2021-05-22: qty 3, 84d supply, fill #2
  Filled 2021-09-15: qty 3, 84d supply, fill #3

## 2020-12-12 MED FILL — Metaxalone Tab 800 MG: ORAL | 20 days supply | Qty: 60 | Fill #0 | Status: CN

## 2020-12-13 ENCOUNTER — Other Ambulatory Visit (HOSPITAL_COMMUNITY): Payer: Self-pay

## 2020-12-28 ENCOUNTER — Ambulatory Visit: Payer: 59 | Admitting: Specialist

## 2020-12-29 ENCOUNTER — Other Ambulatory Visit (HOSPITAL_COMMUNITY): Payer: Self-pay

## 2020-12-29 MED FILL — Thyroid Tab 90 MG (1 1/2 Grain): ORAL | 84 days supply | Qty: 60 | Fill #0 | Status: AC

## 2020-12-29 MED FILL — Thyroid Tab 60 MG (1 Grain): ORAL | 84 days supply | Qty: 24 | Fill #0 | Status: AC

## 2021-01-09 ENCOUNTER — Encounter: Payer: Self-pay | Admitting: Internal Medicine

## 2021-01-09 ENCOUNTER — Encounter: Payer: Self-pay | Admitting: Rheumatology

## 2021-01-10 ENCOUNTER — Other Ambulatory Visit (HOSPITAL_COMMUNITY): Payer: Self-pay

## 2021-01-10 NOTE — Telephone Encounter (Signed)
I reviewed her medications.  She is only taking Plaquenil.  She does not need to stop Plaquenil.  I called patient but did not get a response.

## 2021-02-07 DIAGNOSIS — Z800 Family history of malignant neoplasm of digestive organs: Secondary | ICD-10-CM | POA: Diagnosis not present

## 2021-02-07 DIAGNOSIS — N920 Excessive and frequent menstruation with regular cycle: Secondary | ICD-10-CM | POA: Diagnosis not present

## 2021-02-07 DIAGNOSIS — E039 Hypothyroidism, unspecified: Secondary | ICD-10-CM | POA: Diagnosis not present

## 2021-02-07 DIAGNOSIS — N923 Ovulation bleeding: Secondary | ICD-10-CM | POA: Diagnosis not present

## 2021-02-09 ENCOUNTER — Encounter: Payer: Self-pay | Admitting: Internal Medicine

## 2021-02-09 NOTE — Telephone Encounter (Signed)
Labs have been received and uploaded to patient's Media tab.

## 2021-02-10 ENCOUNTER — Other Ambulatory Visit: Payer: Self-pay | Admitting: Internal Medicine

## 2021-02-10 ENCOUNTER — Other Ambulatory Visit (HOSPITAL_COMMUNITY): Payer: Self-pay

## 2021-02-10 DIAGNOSIS — E890 Postprocedural hypothyroidism: Secondary | ICD-10-CM

## 2021-02-10 MED ORDER — ARMOUR THYROID 90 MG PO TABS: 90.0000 mg | ORAL_TABLET | Freq: Every day | ORAL | 3 refills | Status: DC

## 2021-02-10 MED ORDER — ARMOUR THYROID 90 MG PO TABS
90.0000 mg | ORAL_TABLET | Freq: Every day | ORAL | 11 refills | Status: DC
  Filled 2021-02-10 – 2021-02-20 (×2): qty 30, 30d supply, fill #0
  Filled 2021-04-04 – 2021-04-05 (×2): qty 30, 30d supply, fill #1
  Filled 2021-05-04: qty 30, 30d supply, fill #2

## 2021-02-20 ENCOUNTER — Other Ambulatory Visit (HOSPITAL_COMMUNITY): Payer: Self-pay

## 2021-02-25 ENCOUNTER — Ambulatory Visit
Admission: EM | Admit: 2021-02-25 | Discharge: 2021-02-25 | Disposition: A | Discharge status: Home / Self Care | Payer: 59 | Attending: Physician Assistant | Admitting: Physician Assistant

## 2021-02-25 ENCOUNTER — Ambulatory Visit (INDEPENDENT_AMBULATORY_CARE_PROVIDER_SITE_OTHER): Payer: 59

## 2021-02-25 ENCOUNTER — Encounter: Payer: Self-pay | Admitting: Emergency Medicine

## 2021-02-25 ENCOUNTER — Other Ambulatory Visit: Payer: Self-pay

## 2021-02-25 DIAGNOSIS — U071 COVID-19: Secondary | ICD-10-CM | POA: Diagnosis not present

## 2021-02-25 DIAGNOSIS — R059 Cough, unspecified: Secondary | ICD-10-CM

## 2021-02-25 DIAGNOSIS — R062 Wheezing: Secondary | ICD-10-CM

## 2021-02-25 IMAGING — DX DG CHEST 2V
2 series · 2 of 2 positions shown · non-contrast
Comparison: [DATE]

CLINICAL DATA: Cough and wheezing

EXAM:
CHEST - 2 VIEW

[chest pa]
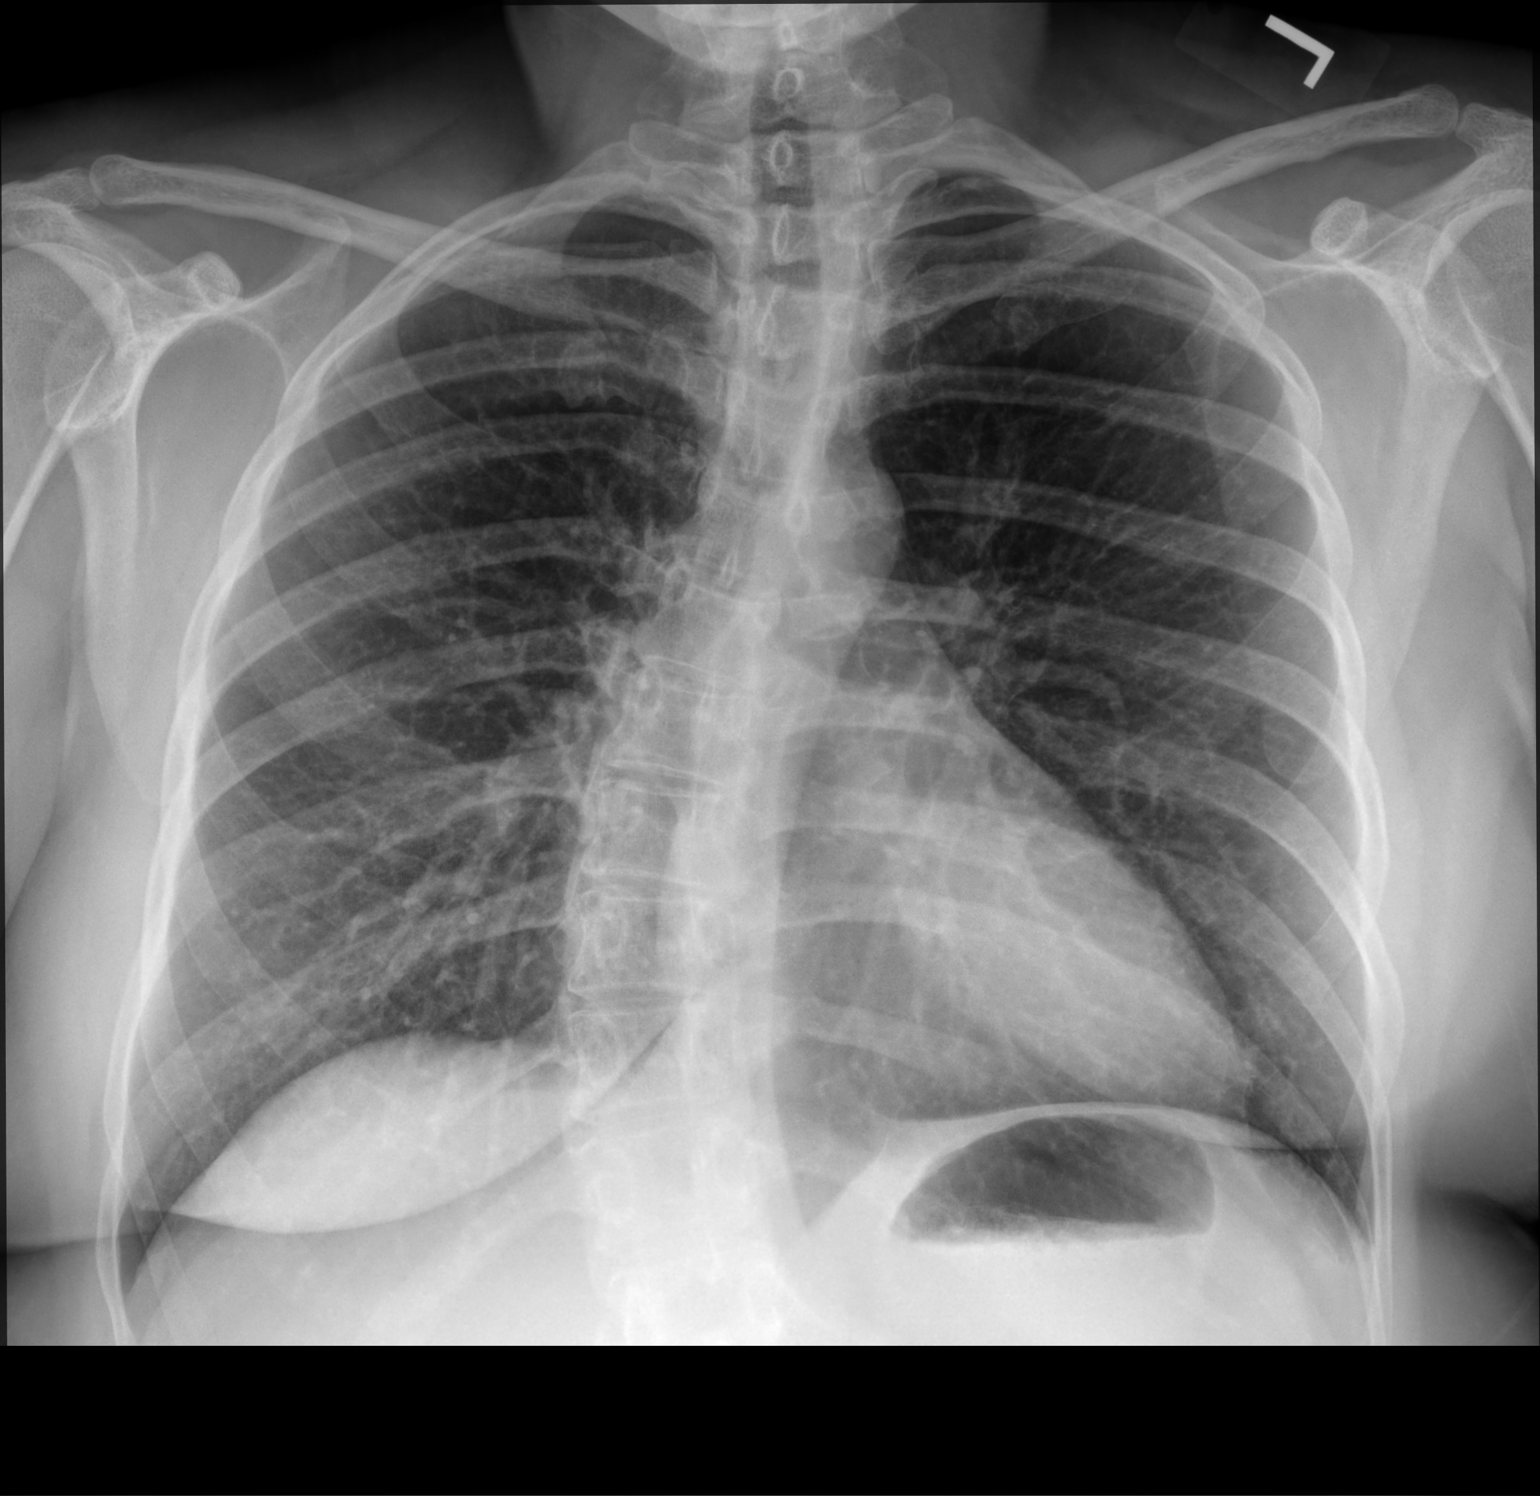

[chest lat]
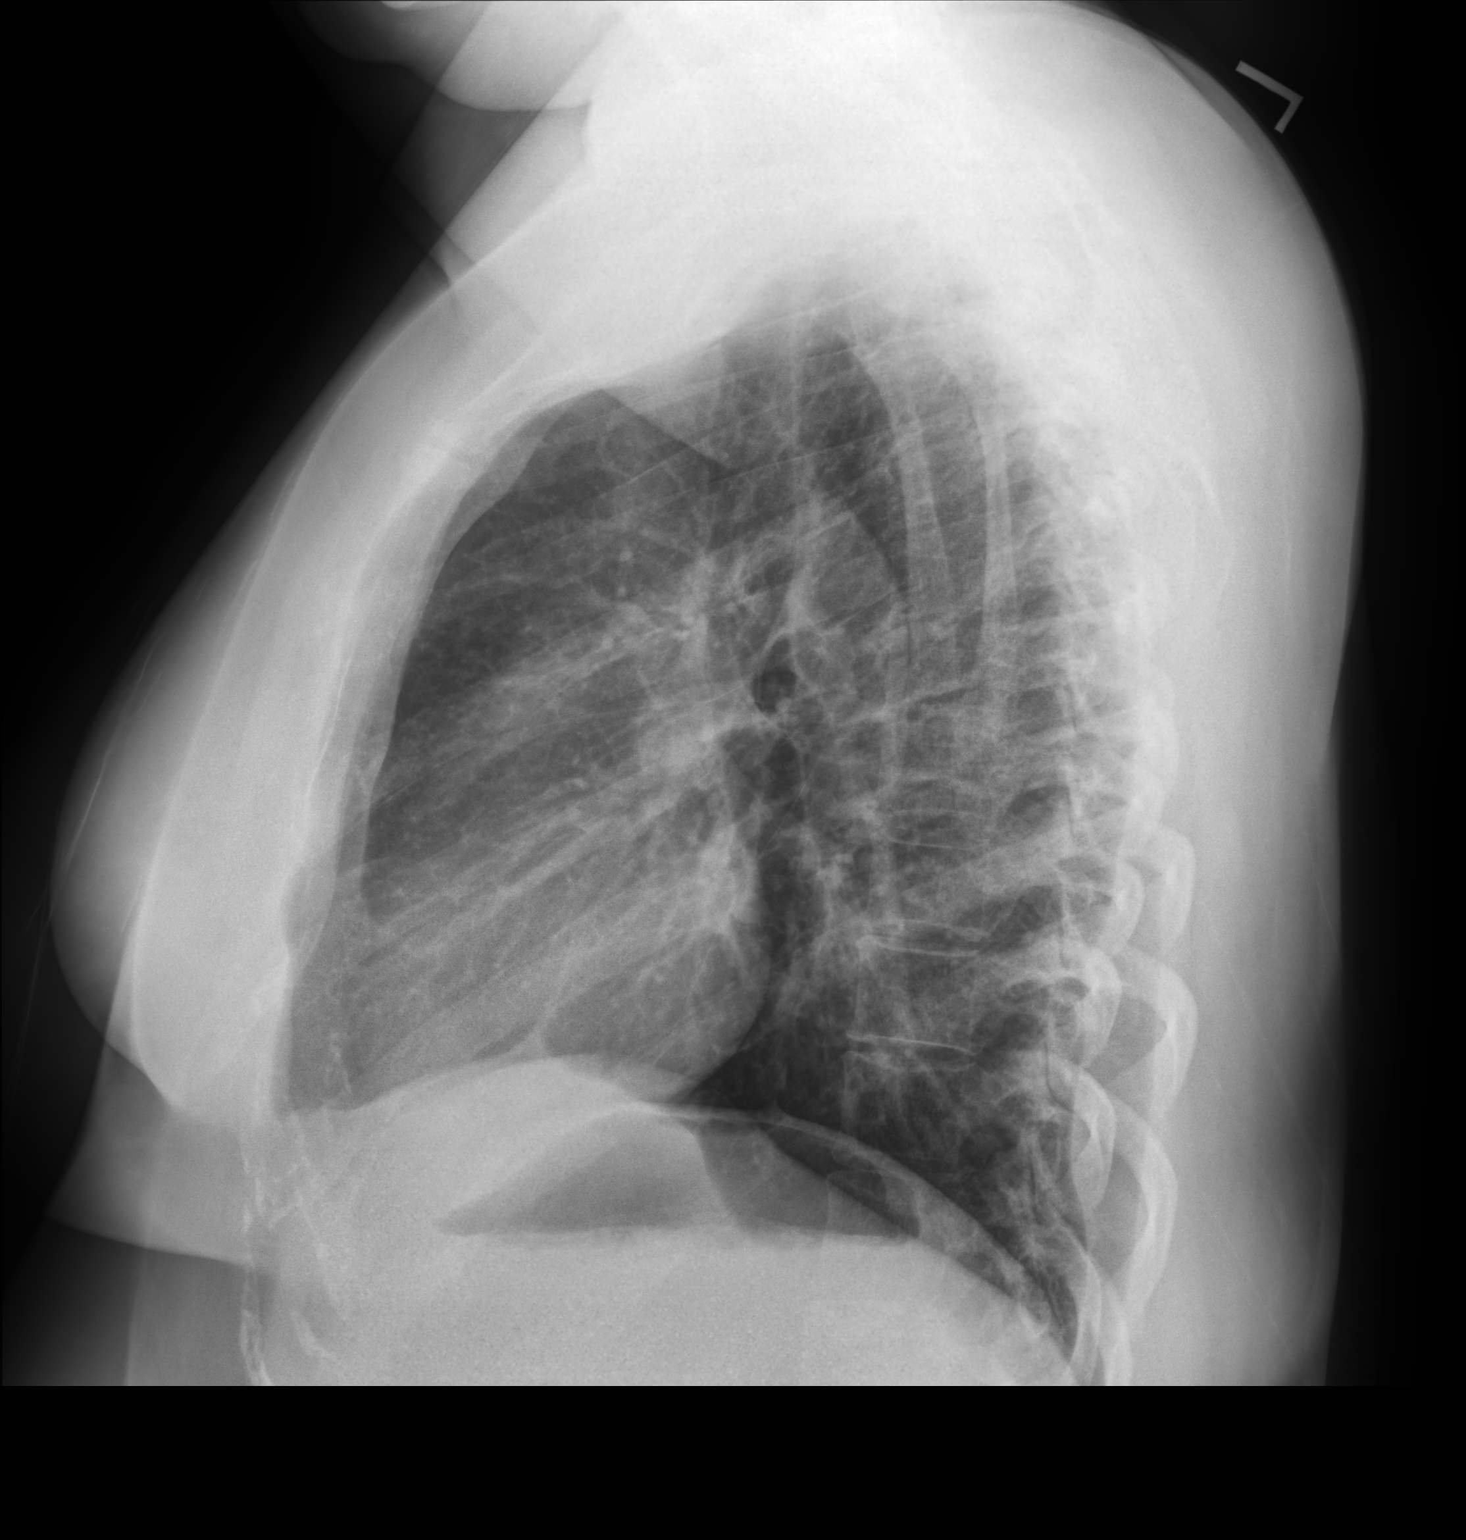

[2 of 2 positions shown; findings below may reference images not displayed]

FINDINGS: Heart size and vascularity normal. Lungs clear without infiltrate or
effusion. Thoracic scoliosis convex to the right unchanged.
IMPRESSION: No active cardiopulmonary disease.

## 2021-02-25 MED ORDER — NIRMATRELVIR/RITONAVIR (PAXLOVID)TABLET: 3.0000 | ORAL_TABLET | Freq: Two times a day (BID) | ORAL | 0 refills | Status: AC

## 2021-02-25 MED ORDER — BENZONATATE 100 MG PO CAPS: 100.0000 mg | ORAL_CAPSULE | Freq: Three times a day (TID) | ORAL | 0 refills | Status: DC | PRN

## 2021-02-25 MED ORDER — PSEUDOEPH-BROMPHEN-DM 30-2-10 MG/5ML PO SYRP: 10.0000 mL | ORAL_SOLUTION | Freq: Four times a day (QID) | ORAL | 0 refills | Status: DC | PRN

## 2021-02-25 NOTE — Discharge Instructions (Addendum)
Take medications as prescribed Return for evaluation if you develop shortness of breath or difficulty breathing.  Self isolate 5 days from symptom onset, and then wear a mask around others for 5 days.

## 2021-02-25 NOTE — ED Provider Notes (Signed)
[facility]    CSN: [id number] Arrival date & time: 02/25/21  0910      History   Chief Complaint Chief Complaint  Patient presents with   Cough    HPI Taylor Bennett is a 40 y.o. female.   Pt reports three days ago she woke up with head congestion, sore throat, body aches, and cough.  She denies wheezing, shortness of breath, fever, n/v/d.  She took a home COVID test yesterday which was positive.  Pt has been taking OTC cold and cough medications with temporary relief.  She reports she was COVID positive 01/09/21 and treated with paxlovid with improvement.  Tested negative on antigen test to return to work.  Pt immunocompromised, on plaquenil for RA.  She is vaccinated against COVID.    Past Medical History:  Diagnosis Date   Back pain    Cervical pain    PCOS (polycystic ovarian syndrome)    Scoliosis    Thyroid disease     Patient Active Problem List   Diagnosis Date Noted   Paresthesia 11/30/2016   Neck pain 10/23/2016   Left cervical radiculopathy 10/23/2016   Vitamin D deficiency 08/10/2016   PCOS (polycystic ovarian syndrome) 06/16/2013   Migraines 06/16/2013   Hypothyroidism 06/10/2013    Past Surgical History:  Procedure Laterality Date   ANKLE SURGERY  2009   ORIF    THYROIDECTOMY     WISDOM TOOTH EXTRACTION      OB History   No obstetric history on file.      Home Medications    Prior to Admission medications   Medication Sig Start Date End Date Taking? Authorizing Provider  ARMOUR THYROID 90 MG tablet Take 1 tablet (90 mg total) by mouth daily. 02/10/21 02/10/22 Yes Philemon Kingdom, MD  Ascorbic Acid (VITAMIN C PO) Take by mouth daily.   Yes [provider]  benzonatate (TESSALON) 100 MG capsule Take 1 capsule (100 mg total) by mouth 3 (three) times daily as needed for cough. 02/25/21  Yes Dilcia Rybarczyk, PA-C  brompheniramine-pseudoephedrine-DM 30-2-10 MG/5ML syrup Take 10 mLs by mouth 4 (four) times daily as needed.  02/25/21  Yes Cory Kitt, PA-C  Cholecalciferol (VITAMIN D PO) Take 5,000 Units by mouth 2 (two) times daily.    Yes [provider]  etonogestrel-ethinyl estradiol (NUVARING) 0.12-0.015 MG/24HR vaginal ring Insert 1 ring vaginally; leave in place for 3 weeks, remove, and replace with a new ring.  Take 7 day break after every third ring Vaginal as directed 21 day(s) 12/12/20  Yes   hydroxychloroquine (PLAQUENIL) 200 MG tablet TAKE 1 TABLET BY MOUTH TWICE DAILY FROM MONDAY TO FRIDAY. 11/25/20 11/25/21 Yes Ofilia Neas, PA-C  ibuprofen (ADVIL,MOTRIN) 200 MG tablet Takes 600mg  - 800mg  daily, as needed.   Yes [provider]  loratadine (CLARITIN) 10 MG tablet Take 10 mg by mouth daily.   Yes [provider]  meclizine (ANTIVERT) 25 MG tablet Take 1 tablet (25 mg total) by mouth 3 (three) times daily as needed for dizziness. 07/19/15  Yes Harvel Quale, MD  Metaxalone (SKELAXIN PO) Take by mouth as needed.   Yes [provider]  metaxalone (SKELAXIN) 800 MG tablet TAKE 1/2 TO 1 TABLET BY MOUTH UP TO 3 TIMES DAILY. 12/07/20 12/07/21 Yes Redmon, Noelle, PA  Multiple Vitamin (MULTIVITAMIN PO) Take by mouth daily.   Yes [provider]  nirmatrelvir/ritonavir EUA (PAXLOVID) TABS Take 3 tablets by mouth 2 (two) times daily for 5 days.  Patient GFR is 90. Take nirmatrelvir (150 mg) two tablets twice daily for 5 days and ritonavir (100 mg) one tablet twice daily for 5 days. 02/25/21 03/02/21 Yes Camerin Ladouceur, Janett Billow, PA-C  rizatriptan (MAXALT) 10 MG tablet Take 10 mg by mouth as needed for migraine. May repeat in 2 hours if needed   Yes [provider]  rizatriptan (MAXALT) 10 MG tablet Take 1 tablet (10 mg total) by mouth at the onset of headache. May repeat in 2 hours as needed (max: 2 tabs in 24 hours) 12/12/20  Yes   ZINC CITRATE-PHYTASE PO Take by mouth.   Yes [provider]  norgestimate-ethinyl estradiol (ORTHO-CYCLEN) 0.25-35 MG-MCG tablet TAKE 1  TABLET BY MOUTH ONCE A DAY 11/07/20 11/07/21  Ma Hillock, FNP  gabapentin (NEURONTIN) 100 MG capsule Take 1 capsule (100 mg total) by mouth at bedtime. Patient not taking: Reported on 10/27/2020 08/17/20 10/27/20  Jessy Oto, MD    Family History Family History  Problem Relation Age of Onset   Thyroid disease Mother    Arthritis Mother    Diabetes Father    Congestive Heart Failure Father    Alcoholism Father    Neuropathy Father    Arthritis Father    Osteoarthritis Maternal Aunt    Rheum arthritis Paternal Grandmother    Healthy Daughter    Polycystic ovary syndrome Daughter     Social History Social History   Tobacco Use   Smoking status: Former    Pack years: 0.00    Types: Cigarettes    Start date: 10/13/2011   Smokeless tobacco: Never  Vaping Use   Vaping Use: Never used  Substance Use Topics   Alcohol use: Yes    Comment: occ   Drug use: No     Allergies   Patient has no known allergies.   Review of Systems Review of Systems  Constitutional:  Positive for chills and fever.  HENT:  Positive for congestion. Negative for ear pain and sore throat.   Eyes:  Negative for pain and visual disturbance.  Respiratory:  Positive for cough. Negative for shortness of breath.   Cardiovascular:  Negative for chest pain and palpitations.  Gastrointestinal:  Negative for abdominal pain and vomiting.  Genitourinary:  Negative for dysuria and hematuria.  Musculoskeletal:  Negative for arthralgias and back pain.  Skin:  Negative for color change and rash.  Neurological:  Negative for seizures and syncope.  All other systems reviewed and are negative.   Physical Exam Triage Vital Signs ED Triage Vitals  Enc Vitals Group     BP 02/25/21 0949 125/79     Pulse Rate 02/25/21 0949 89     Resp --      Temp 02/25/21 0949 98.3 F (36.8 C)     Temp Source 02/25/21 0949 Oral     SpO2 02/25/21 0949 95 %     Weight --      Height --      Head Circumference --       Peak Flow --      Pain Score 02/25/21 0951 5     Pain Loc --      Pain Edu? --      Excl. in [place]? --    No data found.  Updated Vital Signs BP 125/79 (BP Location: Left Arm)   Pulse 89   Temp 98.3 F (36.8 C) (Oral)   SpO2 95%   Visual Acuity Right Eye Distance:   Left Eye  Distance:   Bilateral Distance:    Right Eye Near:   Left Eye Near:    Bilateral Near:     Physical Exam Vitals and nursing note reviewed.  Constitutional:      General: She is not in acute distress.    Appearance: She is well-developed.  HENT:     Head: Normocephalic and atraumatic.  Eyes:     Conjunctiva/sclera: Conjunctivae normal.  Cardiovascular:     Rate and Rhythm: Normal rate and regular rhythm.     Heart sounds: No murmur heard. Pulmonary:     Effort: Pulmonary effort is normal. No respiratory distress.     Breath sounds: Normal breath sounds.  Abdominal:     Palpations: Abdomen is soft.     Tenderness: There is no abdominal tenderness.  Musculoskeletal:     Cervical back: Neck supple.  Skin:    General: Skin is warm and dry.  Neurological:     Mental Status: She is alert.     UC Treatments / Results  Labs (all labs ordered are listed, but only abnormal results are displayed) Labs Reviewed - No data to display  EKG   Radiology DG Chest 2 View  Result Date: 02/25/2021 CLINICAL DATA:  Cough and wheezing EXAM: CHEST - 2 VIEW COMPARISON:  10/06/2018 FINDINGS: Heart size and vascularity normal. Lungs clear without infiltrate or effusion. Thoracic scoliosis convex to the right unchanged. IMPRESSION: No active cardiopulmonary disease. Electronically Signed   By: Franchot Gallo M.D.   On: 02/25/2021 11:44    Procedures Procedures (including critical care time)  Medications Ordered in UC Medications - No data to display  Initial Impression / Assessment and Plan / UC Course  I have reviewed the triage vital signs and the nursing notes.  Pertinent labs & imaging results that  were available during my care of the patient were reviewed by me and considered in my medical decision making (see chart for details).     Pt well appearing in no acute distress, chest xray negative for PNA.  Pt is immunocompromised.  With new sx onset and positive covid test pt likely covid positive with new infection.  She had tested negative to return work with last infection. Will treat with paxlovid. Return precautions discussed.   Final Clinical Impressions(s) / UC Diagnoses   Final diagnoses:  Cough  COVID     Discharge Instructions      Take medications as prescribed Return for evaluation if you develop shortness of breath or difficulty breathing.  Self isolate 5 days from symptom onset, and then wear a mask around others for 5 days.      ED Prescriptions     Medication Sig Dispense Auth. Provider   nirmatrelvir/ritonavir EUA (PAXLOVID) TABS Take 3 tablets by mouth 2 (two) times daily for 5 days. Patient GFR is 90. Take nirmatrelvir (150 mg) two tablets twice daily for 5 days and ritonavir (100 mg) one tablet twice daily for 5 days. 30 tablet Kamora Vossler, PA-C   benzonatate (TESSALON) 100 MG capsule Take 1 capsule (100 mg total) by mouth 3 (three) times daily as needed for cough. 21 capsule Khalon Cansler, PA-C   brompheniramine-pseudoephedrine-DM 30-2-10 MG/5ML syrup Take 10 mLs by mouth 4 (four) times daily as needed. 120 mL Konrad Felix, PA-C      PDMP not reviewed this encounter.   Konrad Felix, PA-C 02/25/21 1215

## 2021-02-25 NOTE — ED Triage Notes (Signed)
Patient states that she tested positive for COVID about a month ago.  Patient started with sore throat, head congestion, sneezing, productive cough on Wednesday.  Also having neck pain and chest discomfort.  Patient has been taken OTC cold/flu medication.  Patient is vaccinated for COVID.

## 2021-02-28 ENCOUNTER — Other Ambulatory Visit (HOSPITAL_COMMUNITY): Payer: Self-pay

## 2021-02-28 ENCOUNTER — Telehealth: Payer: Self-pay

## 2021-02-28 ENCOUNTER — Other Ambulatory Visit: Payer: Self-pay | Admitting: Physician Assistant

## 2021-02-28 MED ORDER — HYDROXYCHLOROQUINE SULFATE 200 MG PO TABS: 200.0000 mg | ORAL_TABLET | Freq: Two times a day (BID) | ORAL | 0 refills | Status: DC

## 2021-02-28 MED ORDER — HYDROXYCHLOROQUINE SULFATE 200 MG PO TABS
200.0000 mg | ORAL_TABLET | Freq: Two times a day (BID) | ORAL | 0 refills | Status: DC
  Filled 2021-02-28: qty 120, 84d supply, fill #0

## 2021-02-28 NOTE — Telephone Encounter (Signed)
Last Visit: 10/27/2020  Next Visit: 03/30/2021  Labs: 12/08/2020 Absolute monocytes are borderline elevated. Rest of CBC WNL.  CMP WNL.    Eye exam: 07/28/2020   Current Dose per office note 10/27/2020, Plaquenil 200 mg twice daily Monday through Friday [id number] rheumatoid arthritis    Last Fill: 11/25/2020  Okay to refill PLQ?

## 2021-02-28 NOTE — Telephone Encounter (Signed)
Patient called requesting prescription refill of Plaquenil.  Patient states she is working from home and requesting the prescription be sent to [facility]  in [place]

## 2021-02-28 NOTE — Telephone Encounter (Signed)
Prescription resent to requested pharmacy.

## 2021-03-15 ENCOUNTER — Other Ambulatory Visit (HOSPITAL_COMMUNITY): Payer: Self-pay

## 2021-03-15 MED ORDER — CARESTART COVID-19 HOME TEST VI KIT
PACK | 0 refills | Status: DC
  Filled 2021-03-15: qty 4, 4d supply, fill #0

## 2021-03-15 MED FILL — Metaxalone Tab 800 MG: ORAL | 20 days supply | Qty: 60 | Fill #0 | Status: CN

## 2021-03-20 ENCOUNTER — Other Ambulatory Visit: Payer: Self-pay | Admitting: Nurse Practitioner

## 2021-03-20 ENCOUNTER — Other Ambulatory Visit (INDEPENDENT_AMBULATORY_CARE_PROVIDER_SITE_OTHER): Payer: 59

## 2021-03-20 ENCOUNTER — Other Ambulatory Visit: Payer: Self-pay

## 2021-03-20 DIAGNOSIS — E890 Postprocedural hypothyroidism: Secondary | ICD-10-CM | POA: Diagnosis not present

## 2021-03-20 DIAGNOSIS — R234 Changes in skin texture: Secondary | ICD-10-CM

## 2021-03-20 DIAGNOSIS — N926 Irregular menstruation, unspecified: Secondary | ICD-10-CM | POA: Diagnosis not present

## 2021-03-20 DIAGNOSIS — N644 Mastodynia: Secondary | ICD-10-CM | POA: Diagnosis not present

## 2021-03-20 LAB — T3, FREE: T3, Free: 4.5 pg/mL — ABNORMAL HIGH (ref 2.3–4.2)

## 2021-03-20 LAB — TSH: TSH: 2.1 u[IU]/mL (ref 0.35–5.50)

## 2021-03-20 LAB — T4, FREE: Free T4: 0.73 ng/dL (ref 0.60–1.60)

## 2021-03-30 ENCOUNTER — Ambulatory Visit: Payer: 59 | Admitting: Rheumatology

## 2021-04-04 ENCOUNTER — Other Ambulatory Visit (HOSPITAL_COMMUNITY): Payer: Self-pay

## 2021-04-04 DIAGNOSIS — R102 Pelvic and perineal pain: Secondary | ICD-10-CM | POA: Diagnosis not present

## 2021-04-04 DIAGNOSIS — Z01419 Encounter for gynecological examination (general) (routine) without abnormal findings: Secondary | ICD-10-CM | POA: Diagnosis not present

## 2021-04-04 DIAGNOSIS — N939 Abnormal uterine and vaginal bleeding, unspecified: Secondary | ICD-10-CM | POA: Diagnosis not present

## 2021-04-05 ENCOUNTER — Other Ambulatory Visit (HOSPITAL_COMMUNITY): Payer: Self-pay

## 2021-04-05 ENCOUNTER — Encounter: Payer: Self-pay | Admitting: Internal Medicine

## 2021-04-06 ENCOUNTER — Other Ambulatory Visit (HOSPITAL_COMMUNITY): Payer: Self-pay

## 2021-04-06 LAB — HM PAP SMEAR
HM Pap smear: NEGATIVE
HPV 16/18/45 genotyping: NEGATIVE

## 2021-04-10 ENCOUNTER — Ambulatory Visit: Payer: 59 | Admitting: Rheumatology

## 2021-04-11 ENCOUNTER — Telehealth: Payer: Self-pay | Admitting: Pharmacy Technician

## 2021-04-11 NOTE — Telephone Encounter (Addendum)
Patient Advocate Encounter   Received notification from [facility] that prior authorization for ARMOUR THYROID '90MG'$  is required.   PA submitted on 04/11/2021 Key [id number] Status is DENIED  Called plan and faxed labs 04/24/2021.  APPEAL denied.    [facility] will continue to follow   Ronney Asters, CPhT Patient Advocate [facility] Phone: [phone] Fax:  [phone]

## 2021-04-17 NOTE — Progress Notes (Signed)
Office Visit Note  Patient: Taylor Bennett             Date of Birth: 1981/04/05           MRN: [id number]             PCP: Lennie Odor, PA Referring: Lennie Odor, PA Visit Date: 05/01/2021 Occupation: '@GUAROCC'$ @  Subjective:  Pain in both hands   History of Present Illness: Taylor Bennett is a 40 y.o. female with history of rheumatoid arthritis and osteoarthritis.  She states she has been having increased pain and discomfort in her bilateral hands, bilateral wrist and her left knee .  She notices swelling in her hands.  She has been taking hydroxychloroquine 200 mg p.o. twice daily Monday to Friday.  She states she has missed few doses of Plaquenil in the last 2 to 3 weeks.  Activities of Daily Living:  Patient reports morning stiffness for 2-3 hours.   Patient Reports nocturnal pain.  Difficulty dressing/grooming: Denies Difficulty climbing stairs: Reports Difficulty getting out of chair: Reports Difficulty using hands for taps, buttons, cutlery, and/or writing: Reports  Review of Systems  Constitutional:  Positive for fatigue.  HENT:  Negative for mouth sores, mouth dryness and nose dryness.   Eyes:  Positive for dryness. Negative for pain and itching.  Respiratory:  Negative for shortness of breath and difficulty breathing.   Cardiovascular:  Negative for chest pain and palpitations.  Gastrointestinal:  Negative for blood in stool, constipation and diarrhea.  Endocrine: Negative for increased urination.  Genitourinary:  Negative for difficulty urinating.  Musculoskeletal:  Positive for joint pain, joint pain, joint swelling, myalgias, morning stiffness, muscle tenderness and myalgias.  Skin:  Negative for color change, rash and redness.  Allergic/Immunologic: Positive for susceptible to infections.  Neurological:  Positive for numbness, parasthesias, memory loss and weakness. Negative for dizziness and headaches.  Hematological:  Positive for bruising/bleeding  tendency.  Psychiatric/Behavioral:  Negative for confusion.    PMFS History:  Patient Active Problem List   Diagnosis Date Noted   Paresthesia 11/30/2016   Neck pain 10/23/2016   Left cervical radiculopathy 10/23/2016   Vitamin D deficiency 08/10/2016   PCOS (polycystic ovarian syndrome) 06/16/2013   Migraines 06/16/2013   Hypothyroidism 06/10/2013    Past Medical History:  Diagnosis Date   Back pain    Cervical pain    PCOS (polycystic ovarian syndrome)    Scoliosis    Thyroid disease     Family History  Problem Relation Age of Onset   Thyroid disease Mother    Arthritis Mother    Diabetes Father    Congestive Heart Failure Father    Alcoholism Father    Neuropathy Father    Arthritis Father    Osteoarthritis Maternal Aunt    Rheum arthritis Paternal Grandmother    Healthy Daughter    Polycystic ovary syndrome Daughter    Past Surgical History:  Procedure Laterality Date   ANKLE SURGERY  2009   ORIF    THYROIDECTOMY     WISDOM TOOTH EXTRACTION     Social History   Social History Narrative   Lives at home with her fiance and children.   Right-handed.   1-2 cup caffeine daily.   Immunization History  Administered Date(s) Administered   Influenza-Unspecified 06/03/2014, 06/20/2015   Moderna Sars-Covid-2 Vaccination 10/02/2019, 11/02/2019, 07/09/2020     Objective: Vital Signs: BP 126/83 (BP Location: Right Arm, Patient Position: Sitting, Cuff Size: Normal)   Pulse 90  Ht '5\' 3"'$  (1.6 m)   Wt 202 lb 6.4 oz (91.8 kg)   LMP 04/06/2021   BMI 35.85 kg/m    Physical Exam Vitals and nursing note reviewed.  Constitutional:      Appearance: She is well-developed.  HENT:     Head: Normocephalic and atraumatic.  Eyes:     Conjunctiva/sclera: Conjunctivae normal.  Cardiovascular:     Rate and Rhythm: Normal rate and regular rhythm.     Heart sounds: Normal heart sounds.  Pulmonary:     Effort: Pulmonary effort is normal.     Breath sounds: Normal breath  sounds.  Abdominal:     General: Bowel sounds are normal.     Palpations: Abdomen is soft.  Musculoskeletal:     Cervical back: Normal range of motion.  Lymphadenopathy:     Cervical: No cervical adenopathy.  Skin:    General: Skin is warm and dry.     Capillary Refill: Capillary refill takes less than 2 seconds.  Neurological:     Mental Status: She is alert and oriented to person, place, and time.  Psychiatric:        Behavior: Behavior normal.     Musculoskeletal Exam: She has discomfort with range of motion of cervical, thoracic and lumbar spine.    Shoulder joints, elbow joints, wrist joints, MCPs PIPs and DIPs with good range of motion with no synovitis.  She had tenderness on palpation of her right third and fourth PIP joints.  Hip joints and knee joints with good range of motion.  She has no tenderness over ankles or MTPs.  CDAI Exam: CDAI Score: 2.6  Patient Global: 4 mm; Provider Global: 2 mm Swollen: 0 ; Tender: 2  Joint Exam 05/01/2021      Right  Left  PIP 3   Tender     PIP 4   Tender        Investigation: No additional findings.  Imaging: US BREAST LTD UNI LEFT INC AXILLA  Result Date: 05/01/2021 CLINICAL DATA:  Patient reports rash and itching of the RIGHT breast which lasted approximately 3 weeks. Rash and itching have resolved completely. Patient feels focal pain possibly associated with mass along LOWER OUTER portion of the RIGHT breast, at the bra line. Patient has a persistent palpable abnormality in the LOWER INNER QUADRANT of the LEFT breast, possibly changed since it was evaluated 1 year ago. EXAM: DIGITAL DIAGNOSTIC BILATERAL MAMMOGRAM WITH TOMOSYNTHESIS AND CAD TECHNIQUE: Bilateral digital diagnostic mammography and breast tomosynthesis was performed. The images were evaluated with computer-aided detection. COMPARISON:  Previous exam(s). ACR Breast Density Category c: The breast tissue is heterogeneously dense, which may obscure small masses. FINDINGS:  RIGHT BREAST: Mammogram: No suspicious mass, distortion, or microcalcifications are identified to suggest presence of malignancy. Spot tangential view of the palpable abnormality in the RIGHT breast is unremarkable. Mammographic images were processed with CAD. Ultrasound: Targeted ultrasound is performed, showing normal appearing fibroglandular tissue in the LOWER OUTER QUADRANT of the RIGHT breast. No suspicious mass, distortion, or acoustic shadowing is demonstrated with ultrasound. LEFT BREAST: Mammogram: No suspicious mass, distortion, or microcalcifications are identified to suggest presence of malignancy. Spot tangential view of the palpable abnormality in the LEFT breast is unremarkable. Mammographic images were processed with CAD. Ultrasound: Targeted ultrasound is performed, showing normal appearing fibroglandular tissue in the LOWER INNER QUADRANT of the LEFT breast. No suspicious mass, distortion, or acoustic shadowing is demonstrated with ultrasound. IMPRESSION: No mammographic or ultrasound evidence for malignancy.  RECOMMENDATION: Screening mammogram in one year.(Code:SM-B-01Y) I have discussed the findings and recommendations with the patient. If applicable, a reminder letter will be sent to the patient regarding the next appointment. BI-RADS CATEGORY  1: Negative. Electronically Signed   By: Nolon Nations M.D.   On: 04/28/2021 15:26   US BREAST LTD UNI RIGHT INC AXILLA  Result Date: 05/01/2021 CLINICAL DATA:  Patient reports rash and itching of the RIGHT breast which lasted approximately 3 weeks. Rash and itching have resolved completely. Patient feels focal pain possibly associated with mass along LOWER OUTER portion of the RIGHT breast, at the bra line. Patient has a persistent palpable abnormality in the LOWER INNER QUADRANT of the LEFT breast, possibly changed since it was evaluated 1 year ago. EXAM: DIGITAL DIAGNOSTIC BILATERAL MAMMOGRAM WITH TOMOSYNTHESIS AND CAD TECHNIQUE: Bilateral  digital diagnostic mammography and breast tomosynthesis was performed. The images were evaluated with computer-aided detection. COMPARISON:  Previous exam(s). ACR Breast Density Category c: The breast tissue is heterogeneously dense, which may obscure small masses. FINDINGS: RIGHT BREAST: Mammogram: No suspicious mass, distortion, or microcalcifications are identified to suggest presence of malignancy. Spot tangential view of the palpable abnormality in the RIGHT breast is unremarkable. Mammographic images were processed with CAD. Ultrasound: Targeted ultrasound is performed, showing normal appearing fibroglandular tissue in the LOWER OUTER QUADRANT of the RIGHT breast. No suspicious mass, distortion, or acoustic shadowing is demonstrated with ultrasound. LEFT BREAST: Mammogram: No suspicious mass, distortion, or microcalcifications are identified to suggest presence of malignancy. Spot tangential view of the palpable abnormality in the LEFT breast is unremarkable. Mammographic images were processed with CAD. Ultrasound: Targeted ultrasound is performed, showing normal appearing fibroglandular tissue in the LOWER INNER QUADRANT of the LEFT breast. No suspicious mass, distortion, or acoustic shadowing is demonstrated with ultrasound. IMPRESSION: No mammographic or ultrasound evidence for malignancy. RECOMMENDATION: Screening mammogram in one year.(Code:SM-B-01Y) I have discussed the findings and recommendations with the patient. If applicable, a reminder letter will be sent to the patient regarding the next appointment. BI-RADS CATEGORY  1: Negative. Electronically Signed   By: Nolon Nations M.D.   On: 04/28/2021 15:26   MM DIAG BREAST TOMO BILATERAL  Result Date: 04/28/2021 CLINICAL DATA:  Patient reports rash and itching of the RIGHT breast which lasted approximately 3 weeks. Rash and itching have resolved completely. Patient feels focal pain possibly associated with mass along LOWER OUTER portion of the  RIGHT breast, at the bra line. Patient has a persistent palpable abnormality in the LOWER INNER QUADRANT of the LEFT breast, possibly changed since it was evaluated 1 year ago. EXAM: DIGITAL DIAGNOSTIC BILATERAL MAMMOGRAM WITH TOMOSYNTHESIS AND CAD TECHNIQUE: Bilateral digital diagnostic mammography and breast tomosynthesis was performed. The images were evaluated with computer-aided detection. COMPARISON:  Previous exam(s). ACR Breast Density Category c: The breast tissue is heterogeneously dense, which may obscure small masses. FINDINGS: RIGHT BREAST: Mammogram: No suspicious mass, distortion, or microcalcifications are identified to suggest presence of malignancy. Spot tangential view of the palpable abnormality in the RIGHT breast is unremarkable. Mammographic images were processed with CAD. Ultrasound: Targeted ultrasound is performed, showing normal appearing fibroglandular tissue in the LOWER OUTER QUADRANT of the RIGHT breast. No suspicious mass, distortion, or acoustic shadowing is demonstrated with ultrasound. LEFT BREAST: Mammogram: No suspicious mass, distortion, or microcalcifications are identified to suggest presence of malignancy. Spot tangential view of the palpable abnormality in the LEFT breast is unremarkable. Mammographic images were processed with CAD. Ultrasound: Targeted ultrasound is performed, showing normal appearing  fibroglandular tissue in the LOWER INNER QUADRANT of the LEFT breast. No suspicious mass, distortion, or acoustic shadowing is demonstrated with ultrasound. IMPRESSION: No mammographic or ultrasound evidence for malignancy. RECOMMENDATION: Screening mammogram in one year.(Code:SM-B-01Y) I have discussed the findings and recommendations with the patient. If applicable, a reminder letter will be sent to the patient regarding the next appointment. BI-RADS CATEGORY  1: Negative. Electronically Signed   By: Nolon Nations M.D.   On: 04/28/2021 15:26    Recent Labs: Lab Results   Component Value Date   WBC 8.0 12/08/2020   HGB 13.1 12/08/2020   PLT 398 12/08/2020   NA 136 12/08/2020   K 4.6 12/08/2020   CL 104 12/08/2020   CO2 26 12/08/2020   GLUCOSE 87 12/08/2020   BUN 13 12/08/2020   CREATININE 0.81 12/08/2020   BILITOT 0.4 12/08/2020   ALKPHOS 58 07/19/2015   AST 15 12/08/2020   ALT 10 12/08/2020   PROT 6.4 12/08/2020   ALBUMIN 3.6 07/19/2015   CALCIUM 8.9 12/08/2020   GFRAA 104 12/08/2020    Speciality Comments: PLQ EYe Exam: 07/28/2020 WNL @ [facility].   Procedures:  No procedures performed Allergies: Patient has no known allergies.   Assessment / Plan:     Visit Diagnoses: Seropositive rheumatoid arthritis ([place]) - +'14 3 3 '$ eta, elevated sedimentation rate in March 2021, +synovitis on ultrasound: -Patient had no synovitis on examination.  Although she complains of pain and discomfort in her hands and tenderness over PIP joints.  She has not been taking Plaquenil on a regular basis.  We discussed taking Plaquenil on a regular basis and check hydroxychloroquine level at the follow-up visit.  I would also schedule a follow-up ultrasound to evaluate for synovitis.  Plan: Sedimentation rate  High risk medication use - Plaquenil 200 mg twice daily Monday through Friday. PLQ Eye Exam: 07/28/2020. - Plan: CBC with Differential/Platelet, COMPLETE METABOLIC PANEL WITH GFR, today.  Hydroxychloroquine, Blood at the follow-up visit.  Primary osteoarthritis of both hands-joint protection muscle strengthening was discussed.  Chondromalacia patellae, left knee-she continues to have some discomfort.  Lower extremity muscle strengthening exercises were discussed  Primary osteoarthritis of both feet-proper fitting shoes were discussed.  Chronic SI joint pain-probably related to myofascial pain and disc disease.  DDD (degenerative disc disease), cervical-she had good range of motion with some stiffness.  Other idiopathic scoliosis, thoracic  region  DDD (degenerative disc disease), lumbar - Followed by Dr. Louanne Skye.  She had updated x-rays on 08/17/2020.   Myofascial pain-she has generalized pain and discomfort.  Plantar fasciitis, bilateral-she gives history of intermittent discomfort.  Trochanteric bursitis of both hips-IT band stretches were demonstrated.  PCOS (polycystic ovarian syndrome)  Hx of migraines  Vitamin D deficiency  Postoperative hypothyroidism  Family history of rheumatoid arthritis  Orders: Orders Placed This Encounter  Procedures   CBC with Differential/Platelet   COMPLETE METABOLIC PANEL WITH GFR   Sedimentation rate   Hydroxychloroquine, Blood    No orders of the defined types were placed in this encounter.    Follow-Up Instructions: Return in about 3 months (around 08/01/2021) for Rheumatoid arthritis.   Bo Merino, MD  Note - This record has been created using Editor, commissioning.  Chart creation errors have been sought, but may not always  have been located. Such creation errors do not reflect on  the standard of medical care.

## 2021-04-28 ENCOUNTER — Ambulatory Visit
Admission: RE | Admit: 2021-04-28 | Discharge: 2021-04-28 | Disposition: A | Discharge status: Home / Self Care | Payer: 59 | Source: Ambulatory Visit | Attending: Nurse Practitioner | Admitting: Nurse Practitioner

## 2021-04-28 ENCOUNTER — Other Ambulatory Visit: Payer: Self-pay

## 2021-04-28 DIAGNOSIS — R234 Changes in skin texture: Secondary | ICD-10-CM

## 2021-04-28 DIAGNOSIS — N644 Mastodynia: Secondary | ICD-10-CM

## 2021-04-28 DIAGNOSIS — R922 Inconclusive mammogram: Secondary | ICD-10-CM | POA: Diagnosis not present

## 2021-04-28 IMAGING — MG DIGITAL DIAGNOSTIC BILAT W/ TOMO W/ CAD
8 of 14 series · 8 of 40 positions shown · non-contrast
Comparison: Previous exam(s).
COMPARISON: Previous exam(s).

Addendum:
CLINICAL DATA: Patient reports rash and itching of the RIGHT breast
which lasted approximately 3 weeks. Rash and itching have resolved
completely. Patient feels focal pain possibly associated with mass
along LOWER OUTER portion of the RIGHT breast, at the bra line.
Patient has a persistent palpable abnormality in the LOWER INNER
QUADRANT of the LEFT breast, possibly changed since it was evaluated
1 year ago.

EXAM:
DIGITAL DIAGNOSTIC BILATERAL MAMMOGRAM WITH TOMOSYNTHESIS AND CAD
TECHNIQUE: Bilateral digital diagnostic mammography and breast tomosynthesis
was performed. The images were evaluated with computer-aided
detection.

[R MLO synth-2D]
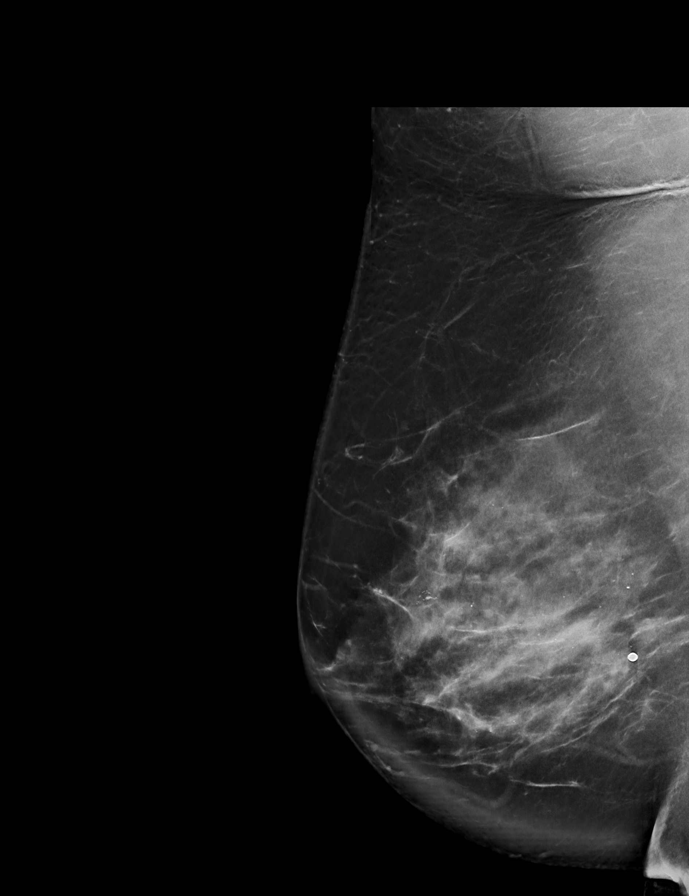

[R TAN synth-2D (1 of 2)]
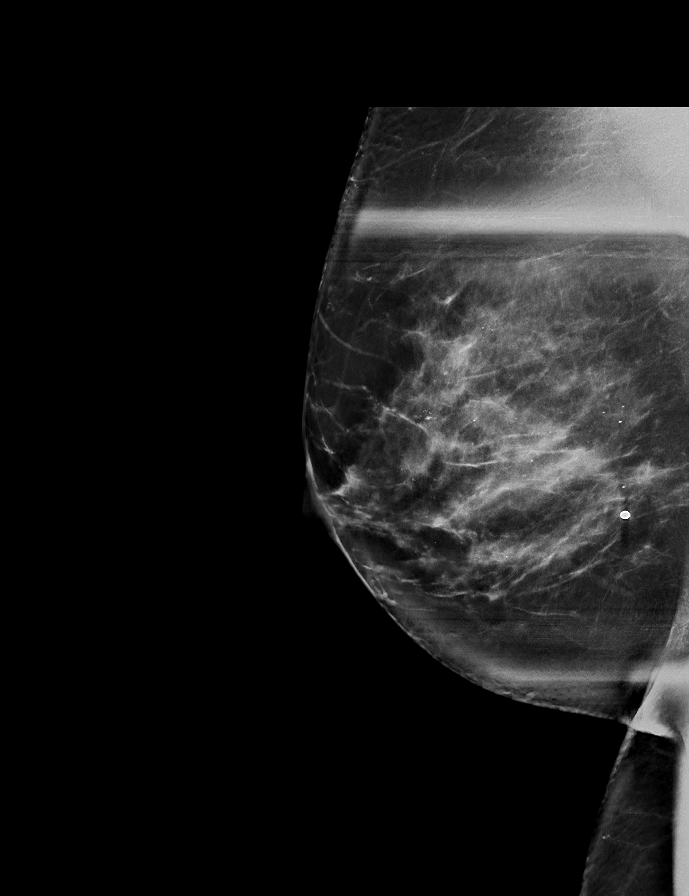

[R TAN synth-2D (2 of 2)]
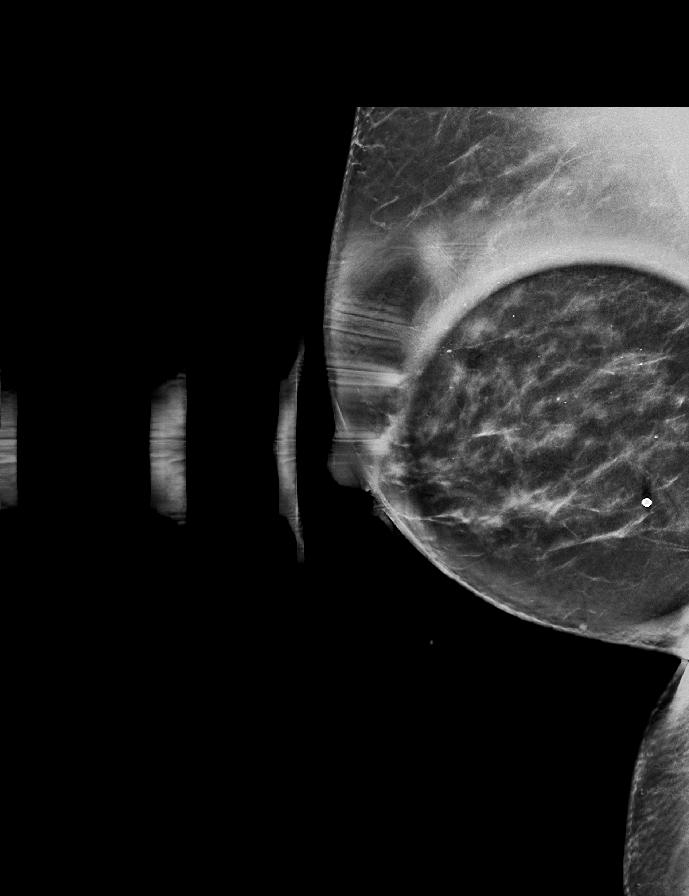

[R CC synth-2D]
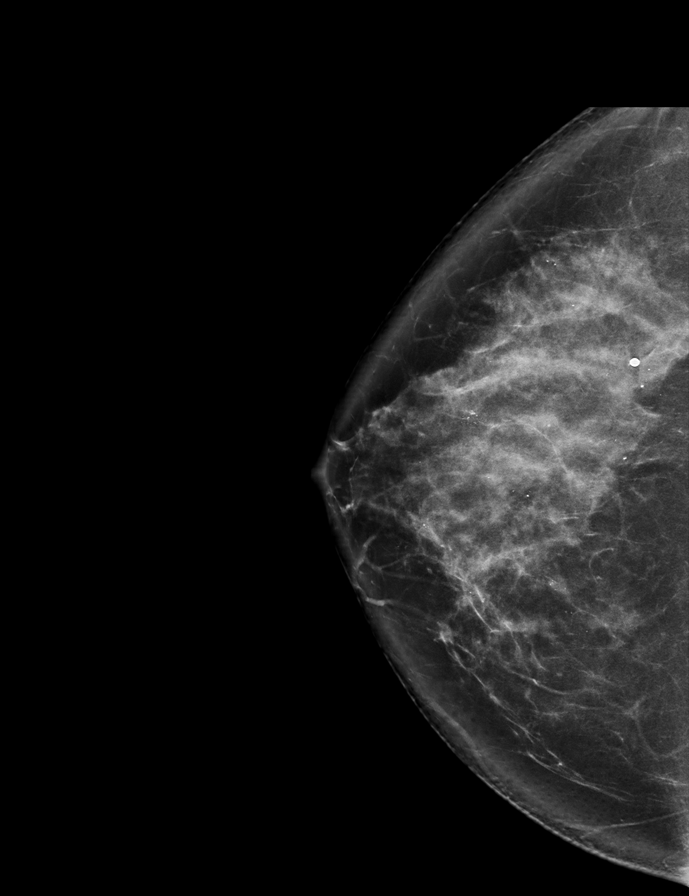

[L CC synth-2D]
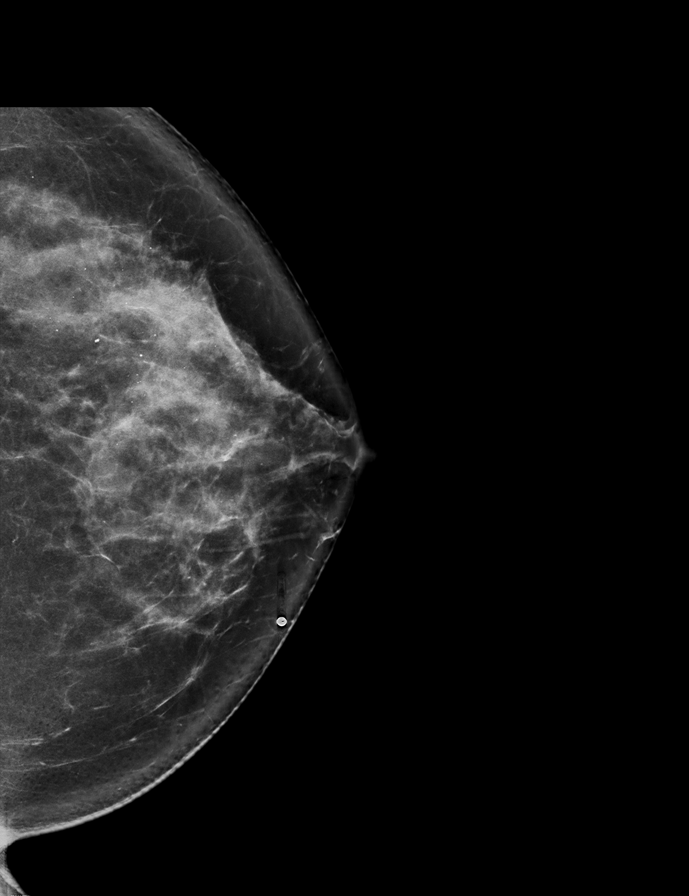

[L TAN synth-2D]
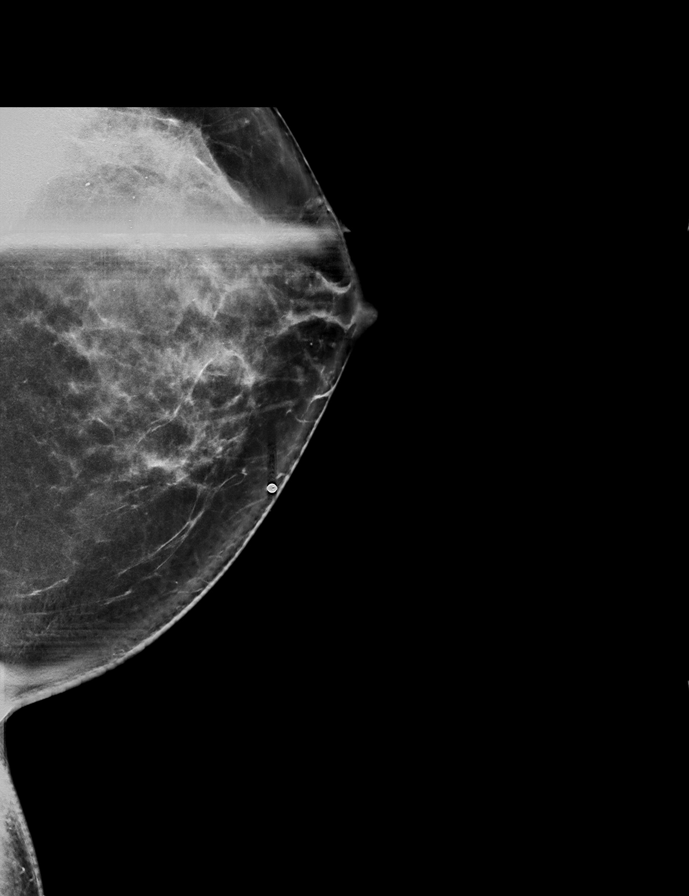

[L MLO synth-2D]
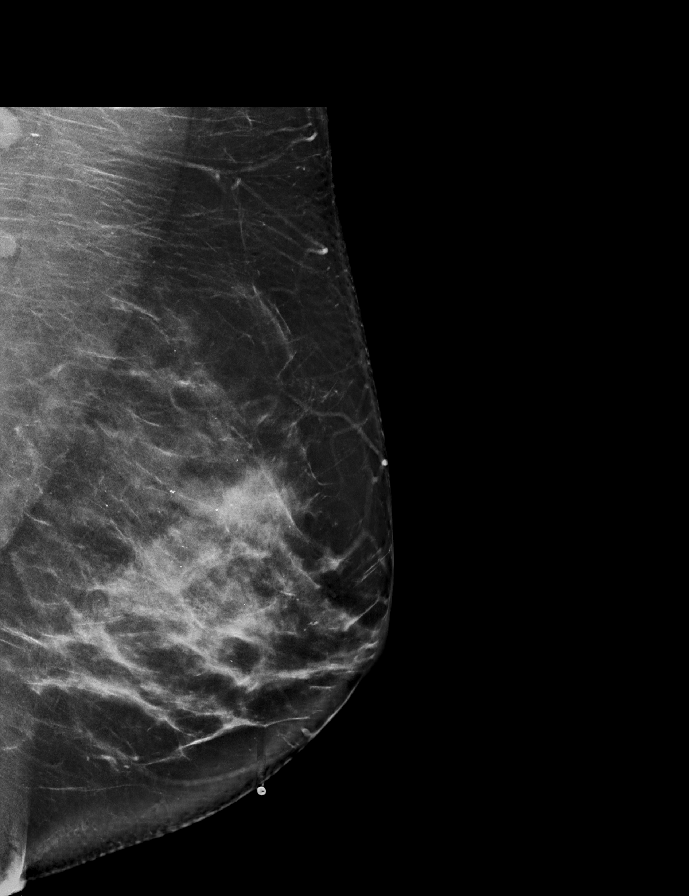

[L TAN tomo · tomo slice 39/78.0]
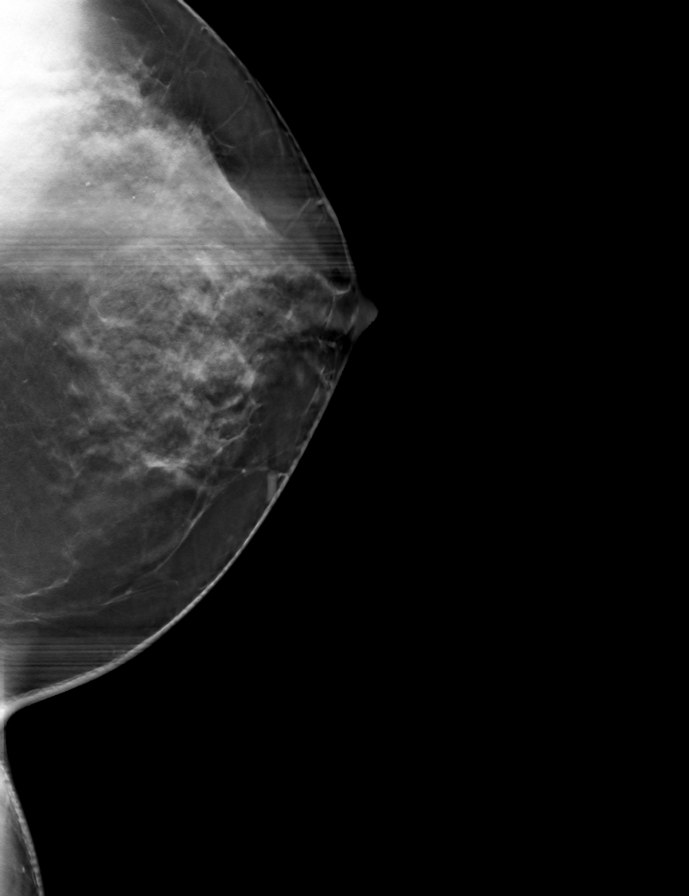

[8 of 40 positions shown; findings below may reference images not displayed]

ACR Breast Density Category c: The breast tissue is heterogeneously
dense, which may obscure small masses.
FINDINGS: RIGHT BREAST:

Mammogram: No suspicious mass, distortion, or microcalcifications
are identified to suggest presence of malignancy. Spot tangential
view of the palpable abnormality in the RIGHT breast is
unremarkable. Mammographic images were processed with CAD.

Ultrasound: Targeted ultrasound is performed, showing normal
appearing fibroglandular tissue in the LOWER OUTER QUADRANT of the
RIGHT breast. No suspicious mass, distortion, or acoustic shadowing
is demonstrated with ultrasound.

LEFT BREAST:

Mammogram: No suspicious mass, distortion, or microcalcifications
are identified to suggest presence of malignancy. Spot tangential
view of the palpable abnormality in the LEFT breast is unremarkable.
Mammographic images were processed with CAD.

Ultrasound: Targeted ultrasound is performed, showing normal
appearing fibroglandular tissue in the LOWER INNER QUADRANT of the
LEFT breast. No suspicious mass, distortion, or acoustic shadowing
is demonstrated with ultrasound.
IMPRESSION: No mammographic or ultrasound evidence for malignancy.

RECOMMENDATION:
Screening mammogram in one year.(Code:[TP])

I have discussed the findings and recommendations with the patient.
If applicable, a reminder letter will be sent to the patient
regarding the next appointment.

BI-RADS CATEGORY  1: Negative.

ADDENDUM:
Bilateral breast ultrasound was performed at the time of the exam.

*** End of Addendum ***
ACR Breast Density Category c: The breast tissue is heterogeneously
dense, which may obscure small masses.
FINDINGS: RIGHT BREAST:

Mammogram: No suspicious mass, distortion, or microcalcifications
are identified to suggest presence of malignancy. Spot tangential
view of the palpable abnormality in the RIGHT breast is
unremarkable. Mammographic images were processed with CAD.

Ultrasound: Targeted ultrasound is performed, showing normal
appearing fibroglandular tissue in the LOWER OUTER QUADRANT of the
RIGHT breast. No suspicious mass, distortion, or acoustic shadowing
is demonstrated with ultrasound.

LEFT BREAST:

Mammogram: No suspicious mass, distortion, or microcalcifications
are identified to suggest presence of malignancy. Spot tangential
view of the palpable abnormality in the LEFT breast is unremarkable.
Mammographic images were processed with CAD.

Ultrasound: Targeted ultrasound is performed, showing normal
appearing fibroglandular tissue in the LOWER INNER QUADRANT of the
LEFT breast. No suspicious mass, distortion, or acoustic shadowing
is demonstrated with ultrasound.
IMPRESSION: No mammographic or ultrasound evidence for malignancy.

RECOMMENDATION:
Screening mammogram in one year.(Code:[TP])

I have discussed the findings and recommendations with the patient.
If applicable, a reminder letter will be sent to the patient
regarding the next appointment.

BI-RADS CATEGORY  1: Negative.

## 2021-04-28 IMAGING — US US BREAST*R* LIMITED INC AXILLA
1 series · 4 of 4 positions shown · non-contrast
Comparison: Previous exam(s).
COMPARISON: Previous exam(s).

Addendum:
CLINICAL DATA: Patient reports rash and itching of the RIGHT breast
which lasted approximately 3 weeks. Rash and itching have resolved
completely. Patient feels focal pain possibly associated with mass
along LOWER OUTER portion of the RIGHT breast, at the bra line.
Patient has a persistent palpable abnormality in the LOWER INNER
QUADRANT of the LEFT breast, possibly changed since it was evaluated
1 year ago.

EXAM:
DIGITAL DIAGNOSTIC BILATERAL MAMMOGRAM WITH TOMOSYNTHESIS AND CAD
TECHNIQUE: Bilateral digital diagnostic mammography and breast tomosynthesis
was performed. The images were evaluated with computer-aided
detection.

[Series 1: us breast*right* limited inc axilla · 0.06mm/px · 4 of 4 slices shown]
[im 1/4]
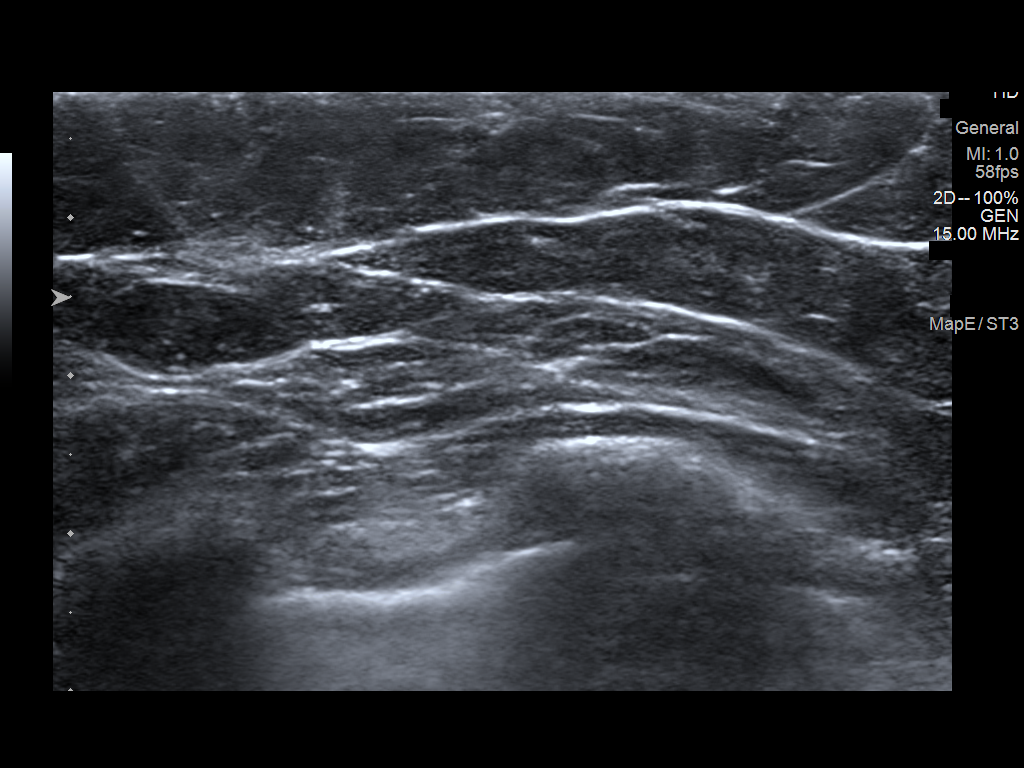
[im 2/4]
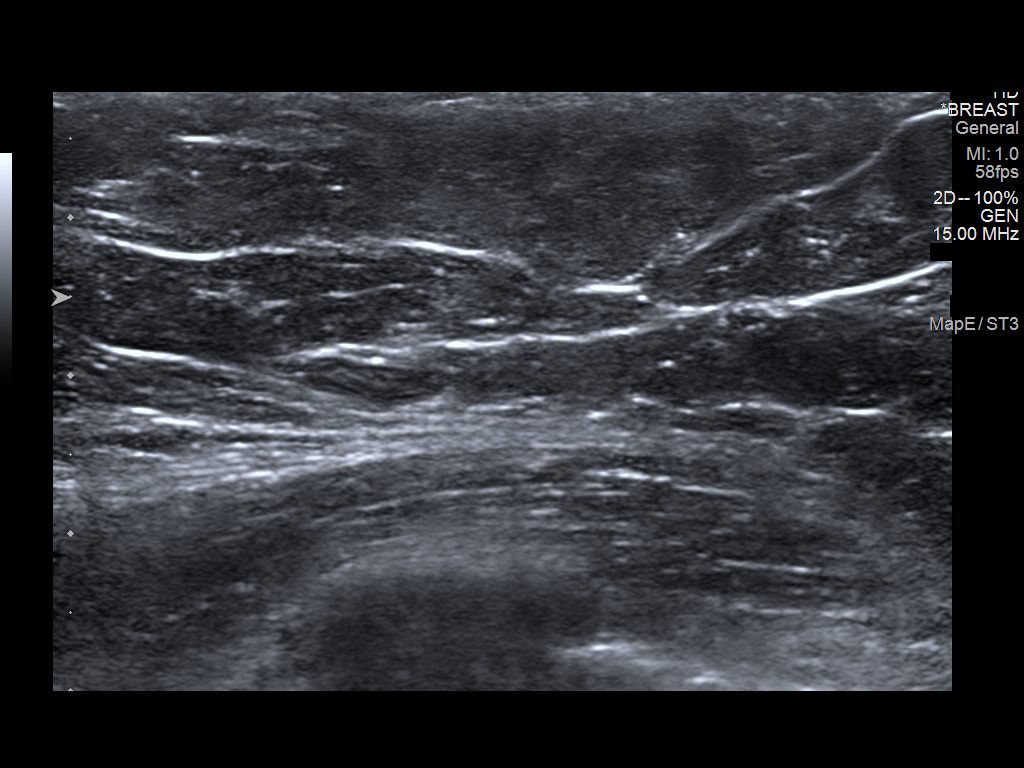
[im 3/4]
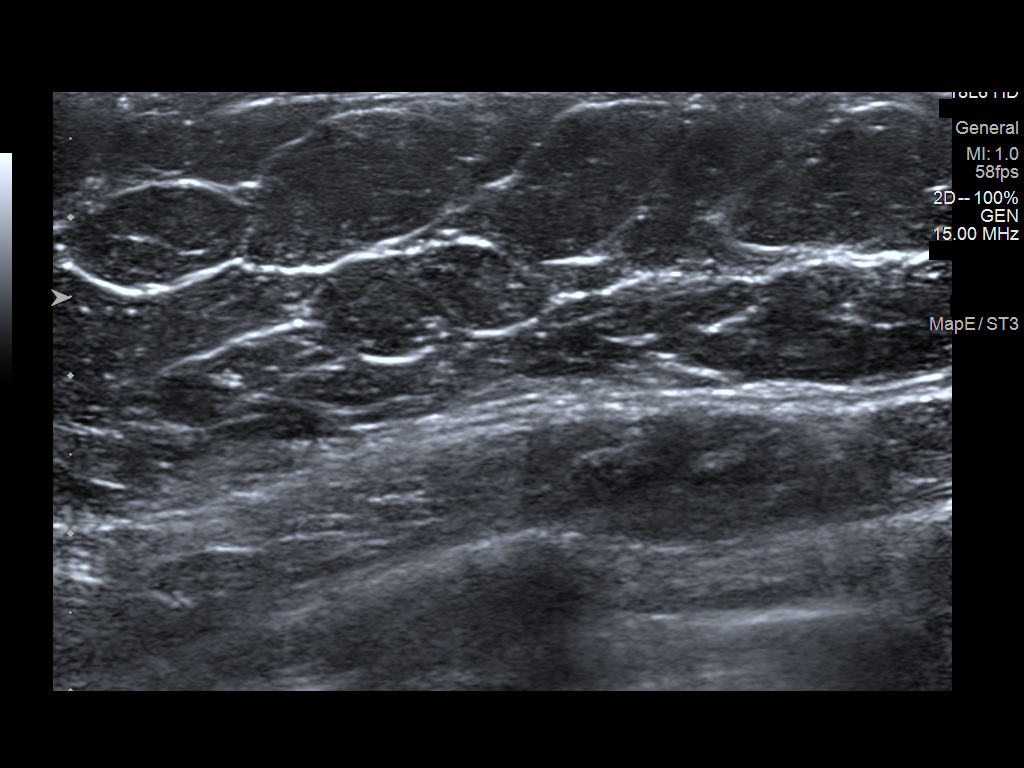
[im 4/4]
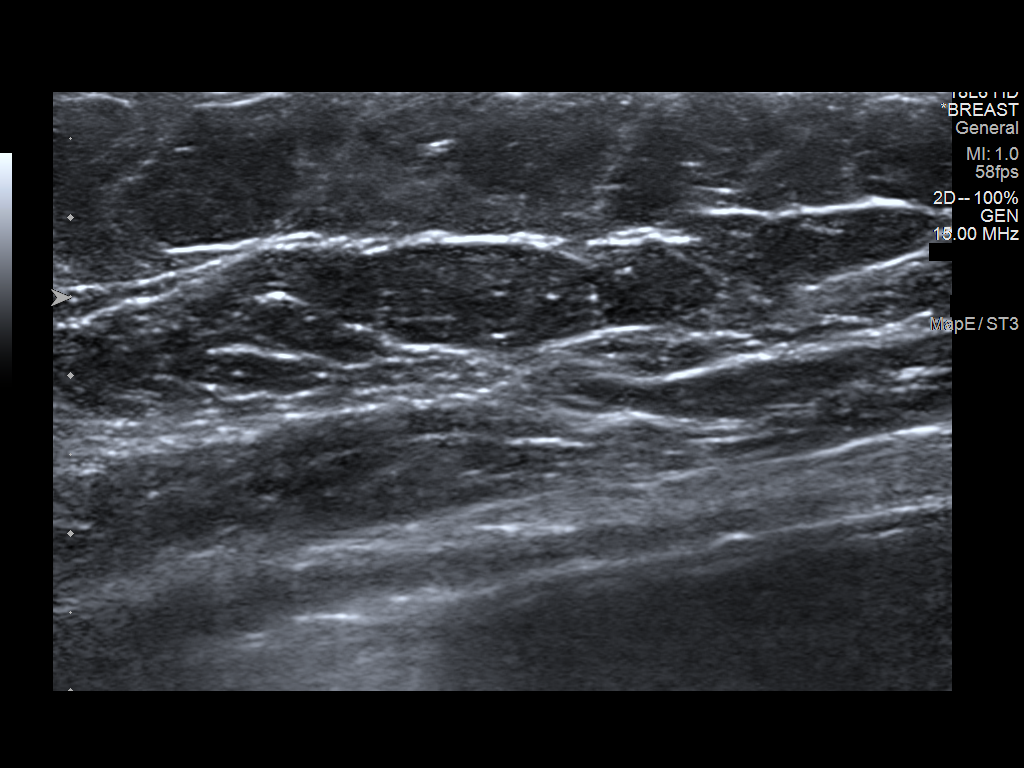

[4 of 4 positions shown; findings below may reference images not displayed]

ACR Breast Density Category c: The breast tissue is heterogeneously
dense, which may obscure small masses.
FINDINGS: RIGHT BREAST:

Mammogram: No suspicious mass, distortion, or microcalcifications
are identified to suggest presence of malignancy. Spot tangential
view of the palpable abnormality in the RIGHT breast is
unremarkable. Mammographic images were processed with CAD.

Ultrasound: Targeted ultrasound is performed, showing normal
appearing fibroglandular tissue in the LOWER OUTER QUADRANT of the
RIGHT breast. No suspicious mass, distortion, or acoustic shadowing
is demonstrated with ultrasound.

LEFT BREAST:

Mammogram: No suspicious mass, distortion, or microcalcifications
are identified to suggest presence of malignancy. Spot tangential
view of the palpable abnormality in the LEFT breast is unremarkable.
Mammographic images were processed with CAD.

Ultrasound: Targeted ultrasound is performed, showing normal
appearing fibroglandular tissue in the LOWER INNER QUADRANT of the
LEFT breast. No suspicious mass, distortion, or acoustic shadowing
is demonstrated with ultrasound.
IMPRESSION: No mammographic or ultrasound evidence for malignancy.

RECOMMENDATION:
Screening mammogram in one year.(Code:[TP])

I have discussed the findings and recommendations with the patient.
If applicable, a reminder letter will be sent to the patient
regarding the next appointment.

BI-RADS CATEGORY  1: Negative.

ADDENDUM:
Bilateral breast ultrasound was performed at the time of the exam.

*** End of Addendum ***
ACR Breast Density Category c: The breast tissue is heterogeneously
dense, which may obscure small masses.
FINDINGS: RIGHT BREAST:

Mammogram: No suspicious mass, distortion, or microcalcifications
are identified to suggest presence of malignancy. Spot tangential
view of the palpable abnormality in the RIGHT breast is
unremarkable. Mammographic images were processed with CAD.

Ultrasound: Targeted ultrasound is performed, showing normal
appearing fibroglandular tissue in the LOWER OUTER QUADRANT of the
RIGHT breast. No suspicious mass, distortion, or acoustic shadowing
is demonstrated with ultrasound.

LEFT BREAST:

Mammogram: No suspicious mass, distortion, or microcalcifications
are identified to suggest presence of malignancy. Spot tangential
view of the palpable abnormality in the LEFT breast is unremarkable.
Mammographic images were processed with CAD.

Ultrasound: Targeted ultrasound is performed, showing normal
appearing fibroglandular tissue in the LOWER INNER QUADRANT of the
LEFT breast. No suspicious mass, distortion, or acoustic shadowing
is demonstrated with ultrasound.
IMPRESSION: No mammographic or ultrasound evidence for malignancy.

RECOMMENDATION:
Screening mammogram in one year.(Code:[TP])

I have discussed the findings and recommendations with the patient.
If applicable, a reminder letter will be sent to the patient
regarding the next appointment.

BI-RADS CATEGORY  1: Negative.

## 2021-05-01 ENCOUNTER — Ambulatory Visit (INDEPENDENT_AMBULATORY_CARE_PROVIDER_SITE_OTHER): Payer: 59 | Admitting: Rheumatology

## 2021-05-01 ENCOUNTER — Encounter: Payer: Self-pay | Admitting: Rheumatology

## 2021-05-01 ENCOUNTER — Other Ambulatory Visit: Payer: Self-pay

## 2021-05-01 VITALS — BP 126/83 | HR 90 | Ht 63.0 in | Wt 202.4 lb

## 2021-05-01 DIAGNOSIS — M19071 Primary osteoarthritis, right ankle and foot: Secondary | ICD-10-CM

## 2021-05-01 DIAGNOSIS — M533 Sacrococcygeal disorders, not elsewhere classified: Secondary | ICD-10-CM | POA: Diagnosis not present

## 2021-05-01 DIAGNOSIS — Z79899 Other long term (current) drug therapy: Secondary | ICD-10-CM | POA: Diagnosis not present

## 2021-05-01 DIAGNOSIS — Z8261 Family history of arthritis: Secondary | ICD-10-CM

## 2021-05-01 DIAGNOSIS — M7918 Myalgia, other site: Secondary | ICD-10-CM

## 2021-05-01 DIAGNOSIS — M5136 Other intervertebral disc degeneration, lumbar region: Secondary | ICD-10-CM | POA: Diagnosis not present

## 2021-05-01 DIAGNOSIS — E890 Postprocedural hypothyroidism: Secondary | ICD-10-CM

## 2021-05-01 DIAGNOSIS — M19041 Primary osteoarthritis, right hand: Secondary | ICD-10-CM | POA: Diagnosis not present

## 2021-05-01 DIAGNOSIS — M2242 Chondromalacia patellae, left knee: Secondary | ICD-10-CM

## 2021-05-01 DIAGNOSIS — M5030 Other cervical disc degeneration, unspecified cervical region: Secondary | ICD-10-CM

## 2021-05-01 DIAGNOSIS — M059 Rheumatoid arthritis with rheumatoid factor, unspecified: Secondary | ICD-10-CM

## 2021-05-01 DIAGNOSIS — M722 Plantar fascial fibromatosis: Secondary | ICD-10-CM

## 2021-05-01 DIAGNOSIS — M4124 Other idiopathic scoliosis, thoracic region: Secondary | ICD-10-CM | POA: Diagnosis not present

## 2021-05-01 DIAGNOSIS — M7061 Trochanteric bursitis, right hip: Secondary | ICD-10-CM

## 2021-05-01 DIAGNOSIS — E559 Vitamin D deficiency, unspecified: Secondary | ICD-10-CM

## 2021-05-01 DIAGNOSIS — M19072 Primary osteoarthritis, left ankle and foot: Secondary | ICD-10-CM

## 2021-05-01 DIAGNOSIS — M7062 Trochanteric bursitis, left hip: Secondary | ICD-10-CM

## 2021-05-01 DIAGNOSIS — M19042 Primary osteoarthritis, left hand: Secondary | ICD-10-CM

## 2021-05-01 DIAGNOSIS — Z8669 Personal history of other diseases of the nervous system and sense organs: Secondary | ICD-10-CM

## 2021-05-01 DIAGNOSIS — G8929 Other chronic pain: Secondary | ICD-10-CM

## 2021-05-01 DIAGNOSIS — E282 Polycystic ovarian syndrome: Secondary | ICD-10-CM

## 2021-05-02 ENCOUNTER — Ambulatory Visit: Payer: 59 | Admitting: Rheumatology

## 2021-05-02 LAB — CBC WITH DIFFERENTIAL/PLATELET
Absolute Monocytes: 720 cells/uL (ref 200–950)
Basophils Absolute: 29 cells/uL (ref 0–200)
Basophils Relative: 0.4 %
Eosinophils Absolute: 58 cells/uL (ref 15–500)
Eosinophils Relative: 0.8 %
HCT: 40.3 % (ref 35.0–45.0)
Hemoglobin: 13.2 g/dL (ref 11.7–15.5)
Lymphs Abs: 1836 cells/uL (ref 850–3900)
MCH: 31.6 pg (ref 27.0–33.0)
MCHC: 32.8 g/dL (ref 32.0–36.0)
MCV: 96.4 fL (ref 80.0–100.0)
MPV: 9.7 fL (ref 7.5–12.5)
Monocytes Relative: 10 %
Neutro Abs: 4558 cells/uL (ref 1500–7800)
Neutrophils Relative %: 63.3 %
Platelets: 358 10*3/uL (ref 140–400)
RBC: 4.18 10*6/uL (ref 3.80–5.10)
RDW: 11.8 % (ref 11.0–15.0)
Total Lymphocyte: 25.5 %
WBC: 7.2 10*3/uL (ref 3.8–10.8)

## 2021-05-02 LAB — COMPLETE METABOLIC PANEL WITH GFR
AG Ratio: 1.4 (calc) (ref 1.0–2.5)
ALT: 12 U/L (ref 6–29)
AST: 15 U/L (ref 10–30)
Albumin: 3.7 g/dL (ref 3.6–5.1)
Alkaline phosphatase (APISO): 41 U/L (ref 31–125)
BUN: 11 mg/dL (ref 7–25)
CO2: 25 mmol/L (ref 20–32)
Calcium: 8.8 mg/dL (ref 8.6–10.2)
Chloride: 108 mmol/L (ref 98–110)
Creat: 0.74 mg/dL (ref 0.50–0.99)
Globulin: 2.6 g/dL (calc) (ref 1.9–3.7)
Glucose, Bld: 83 mg/dL (ref 65–99)
Potassium: 4 mmol/L (ref 3.5–5.3)
Sodium: 141 mmol/L (ref 135–146)
Total Bilirubin: 0.5 mg/dL (ref 0.2–1.2)
Total Protein: 6.3 g/dL (ref 6.1–8.1)
eGFR: 104 mL/min/{1.73_m2} (ref >=60)

## 2021-05-02 LAB — SEDIMENTATION RATE: Sed Rate: 9 mm/h (ref 0–20)

## 2021-05-04 ENCOUNTER — Encounter: Payer: Self-pay | Admitting: Internal Medicine

## 2021-05-04 ENCOUNTER — Other Ambulatory Visit: Payer: Self-pay | Admitting: Internal Medicine

## 2021-05-04 ENCOUNTER — Other Ambulatory Visit (HOSPITAL_COMMUNITY): Payer: Self-pay

## 2021-05-04 MED ORDER — NP THYROID 90 MG PO TABS
90.0000 mg | ORAL_TABLET | Freq: Every day | ORAL | 3 refills | Status: DC
  Filled 2021-05-04: qty 90, 90d supply, fill #0
  Filled 2021-08-07: qty 90, 90d supply, fill #1

## 2021-05-08 ENCOUNTER — Other Ambulatory Visit (HOSPITAL_COMMUNITY): Payer: Self-pay

## 2021-05-08 MED ORDER — QUICKVUE AT-HOME COVID-19 TEST VI KIT
PACK | 0 refills | Status: DC
  Filled 2021-05-08: qty 2, 2d supply, fill #0

## 2021-05-16 ENCOUNTER — Other Ambulatory Visit (HOSPITAL_COMMUNITY): Payer: Self-pay

## 2021-05-16 DIAGNOSIS — R42 Dizziness and giddiness: Secondary | ICD-10-CM | POA: Diagnosis not present

## 2021-05-16 DIAGNOSIS — R519 Headache, unspecified: Secondary | ICD-10-CM | POA: Diagnosis not present

## 2021-05-16 DIAGNOSIS — M47812 Spondylosis without myelopathy or radiculopathy, cervical region: Secondary | ICD-10-CM | POA: Diagnosis not present

## 2021-05-16 DIAGNOSIS — Z23 Encounter for immunization: Secondary | ICD-10-CM | POA: Diagnosis not present

## 2021-05-16 DIAGNOSIS — M5136 Other intervertebral disc degeneration, lumbar region: Secondary | ICD-10-CM | POA: Diagnosis not present

## 2021-05-16 DIAGNOSIS — M5030 Other cervical disc degeneration, unspecified cervical region: Secondary | ICD-10-CM | POA: Diagnosis not present

## 2021-05-16 DIAGNOSIS — M069 Rheumatoid arthritis, unspecified: Secondary | ICD-10-CM | POA: Diagnosis not present

## 2021-05-16 DIAGNOSIS — M419 Scoliosis, unspecified: Secondary | ICD-10-CM | POA: Diagnosis not present

## 2021-05-16 MED ORDER — RIZATRIPTAN BENZOATE 10 MG PO TABS
ORAL_TABLET | ORAL | 3 refills | Status: DC
  Filled 2021-05-16: qty 6, 30d supply, fill #0

## 2021-05-17 ENCOUNTER — Other Ambulatory Visit: Payer: 59 | Admitting: Rheumatology

## 2021-05-19 ENCOUNTER — Other Ambulatory Visit (HOSPITAL_COMMUNITY): Payer: Self-pay

## 2021-05-19 DIAGNOSIS — Z800 Family history of malignant neoplasm of digestive organs: Secondary | ICD-10-CM | POA: Diagnosis not present

## 2021-05-19 DIAGNOSIS — R109 Unspecified abdominal pain: Secondary | ICD-10-CM | POA: Diagnosis not present

## 2021-05-19 DIAGNOSIS — R197 Diarrhea, unspecified: Secondary | ICD-10-CM | POA: Diagnosis not present

## 2021-05-19 DIAGNOSIS — Z1211 Encounter for screening for malignant neoplasm of colon: Secondary | ICD-10-CM | POA: Diagnosis not present

## 2021-05-19 MED ORDER — DICYCLOMINE HCL 10 MG PO CAPS
10.0000 mg | ORAL_CAPSULE | Freq: Three times a day (TID) | ORAL | 3 refills | Status: DC | PRN
  Filled 2021-05-19: qty 90, 30d supply, fill #0
  Filled 2021-12-06: qty 90, 30d supply, fill #1

## 2021-05-22 ENCOUNTER — Other Ambulatory Visit (HOSPITAL_COMMUNITY): Payer: Self-pay

## 2021-05-23 ENCOUNTER — Other Ambulatory Visit: Payer: Self-pay | Admitting: Physician Assistant

## 2021-05-23 DIAGNOSIS — R109 Unspecified abdominal pain: Secondary | ICD-10-CM

## 2021-06-01 DIAGNOSIS — N920 Excessive and frequent menstruation with regular cycle: Secondary | ICD-10-CM | POA: Diagnosis not present

## 2021-06-01 DIAGNOSIS — E282 Polycystic ovarian syndrome: Secondary | ICD-10-CM | POA: Diagnosis not present

## 2021-06-01 DIAGNOSIS — M47812 Spondylosis without myelopathy or radiculopathy, cervical region: Secondary | ICD-10-CM | POA: Diagnosis not present

## 2021-06-01 DIAGNOSIS — F3281 Premenstrual dysphoric disorder: Secondary | ICD-10-CM | POA: Diagnosis not present

## 2021-06-02 ENCOUNTER — Other Ambulatory Visit (HOSPITAL_COMMUNITY): Payer: Self-pay

## 2021-06-02 ENCOUNTER — Other Ambulatory Visit: Payer: Self-pay | Admitting: Physician Assistant

## 2021-06-02 MED ORDER — HYDROXYCHLOROQUINE SULFATE 200 MG PO TABS
200.0000 mg | ORAL_TABLET | Freq: Two times a day (BID) | ORAL | 0 refills | Status: DC
  Filled 2021-06-02: qty 120, 84d supply, fill #0

## 2021-06-02 NOTE — Telephone Encounter (Signed)
Next Visit: 08/10/2021  Last Visit: 05/01/2021  Labs: 05/01/2021 CBC and CMP WNL.    Eye exam: 07/28/2020   Current Dose per office note 05/01/2021: Plaquenil 200 mg twice daily Monday through Friday.  [id number] rheumatoid arthritis   Last Fill: 02/28/2021  Okay to refill Plaquenil?

## 2021-06-05 ENCOUNTER — Other Ambulatory Visit (HOSPITAL_COMMUNITY): Payer: Self-pay

## 2021-06-12 ENCOUNTER — Ambulatory Visit
Admission: RE | Admit: 2021-06-12 | Discharge: 2021-06-12 | Disposition: A | Discharge status: Home / Self Care | Payer: 59 | Source: Ambulatory Visit | Attending: Physician Assistant | Admitting: Physician Assistant

## 2021-06-12 DIAGNOSIS — K7689 Other specified diseases of liver: Secondary | ICD-10-CM | POA: Diagnosis not present

## 2021-06-12 DIAGNOSIS — R110 Nausea: Secondary | ICD-10-CM | POA: Diagnosis not present

## 2021-06-12 DIAGNOSIS — R109 Unspecified abdominal pain: Secondary | ICD-10-CM | POA: Diagnosis not present

## 2021-06-12 DIAGNOSIS — R197 Diarrhea, unspecified: Secondary | ICD-10-CM | POA: Diagnosis not present

## 2021-06-12 IMAGING — CT CT ABD-PELV W/ CM
2 of 5 series · 12 of 46 positions shown, 14 images · IV contrast (iopamidol)
Comparison: None.

CLINICAL DATA: Bdominal pain, nausea, diarrhea, and cramping

EXAM:
CT ABDOMEN AND PELVIS WITH CONTRAST
TECHNIQUE: Multidetector CT imaging of the abdomen and pelvis was performed
using the standard protocol following bolus administration of
intravenous contrast. Sagittal and coronal MPR images reconstructed
from axial data set.
CONTRAST:  100mL [2K] IOPAMIDOL ([2K]) INJECTION 61% IV.
Dilate oral contrast.

[Series 2: abd pelvis 5.00 br40 s3 axial · axial · 0.67mm/px · z∈[+1361,+1756]mm · 9 of 94 slices shown, 11 images]
[im 10/94  soft-tissue]
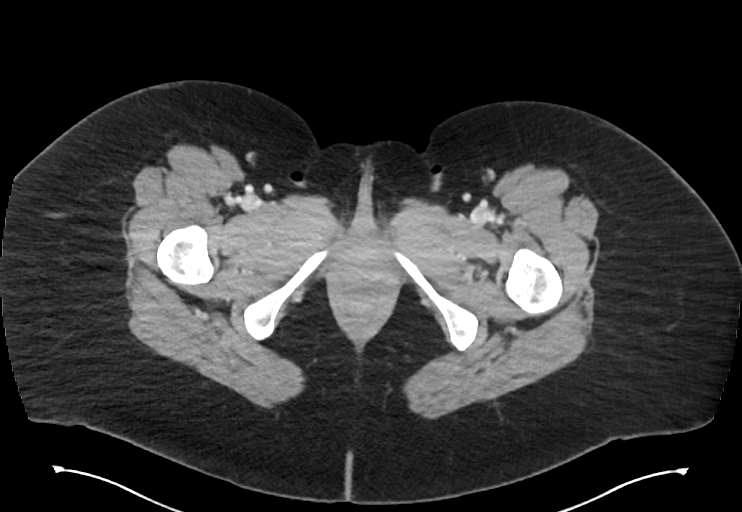
[im 10/94  bone]
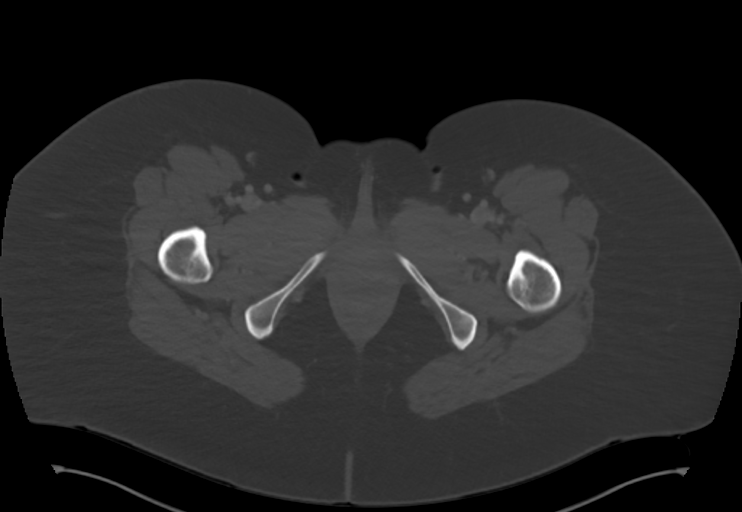
[im 20/94  soft-tissue]
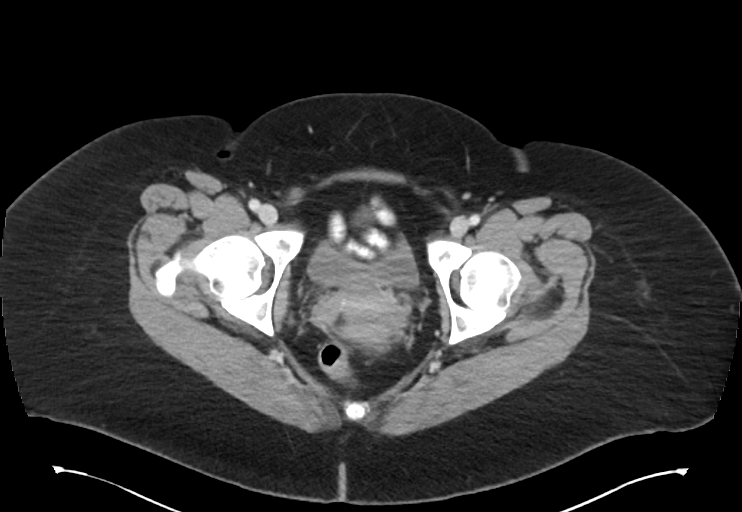
[im 30/94  soft-tissue]
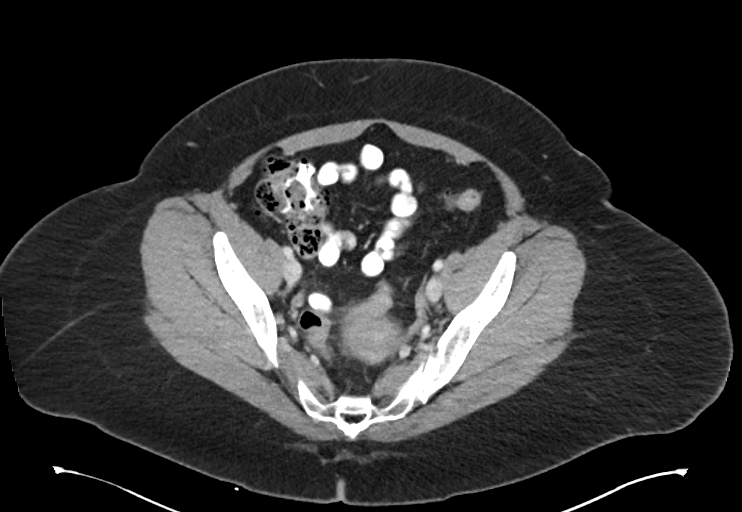
[im 40/94  soft-tissue]
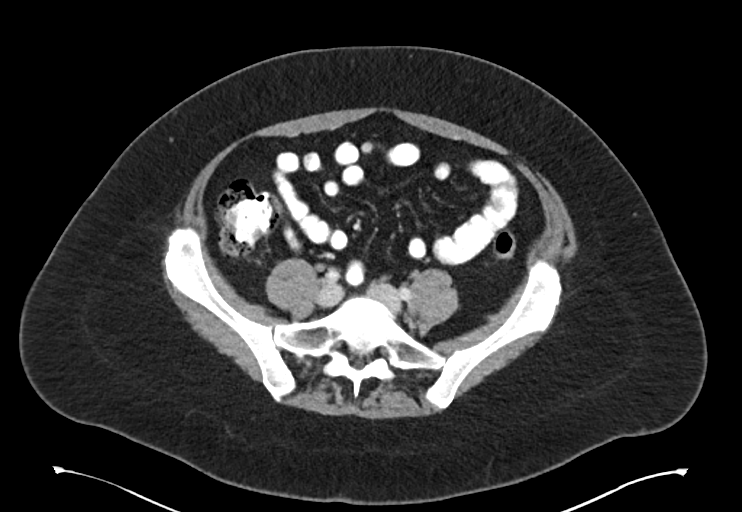
[im 49/94  soft-tissue]
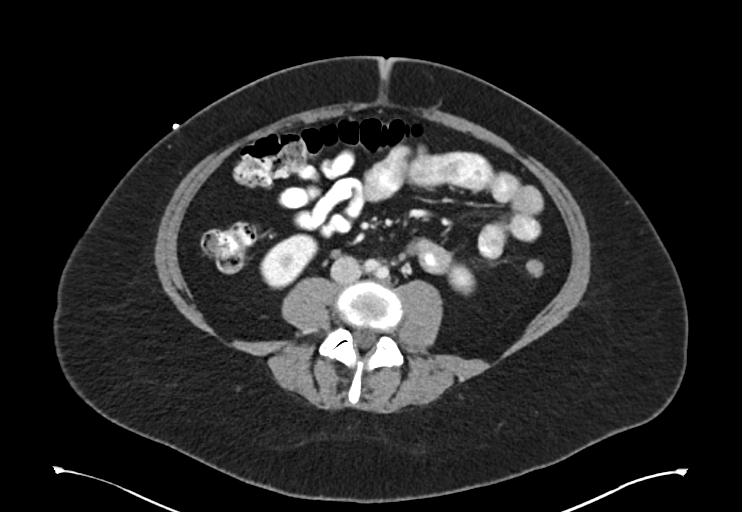
[im 59/94  soft-tissue]
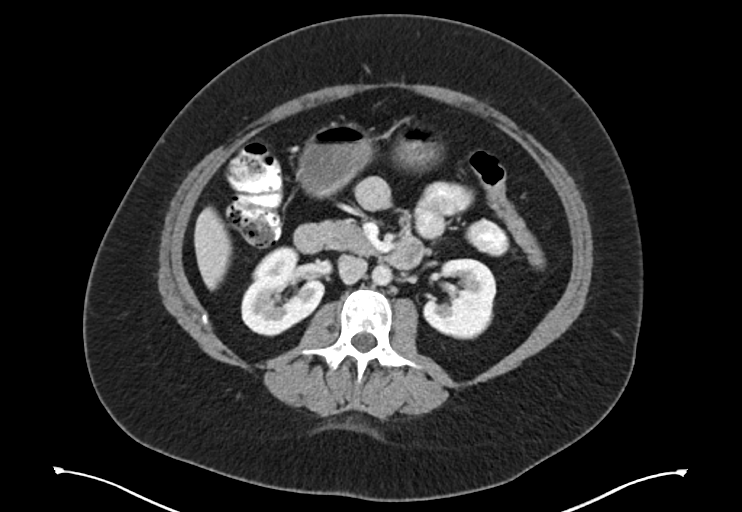
[im 69/94  soft-tissue]
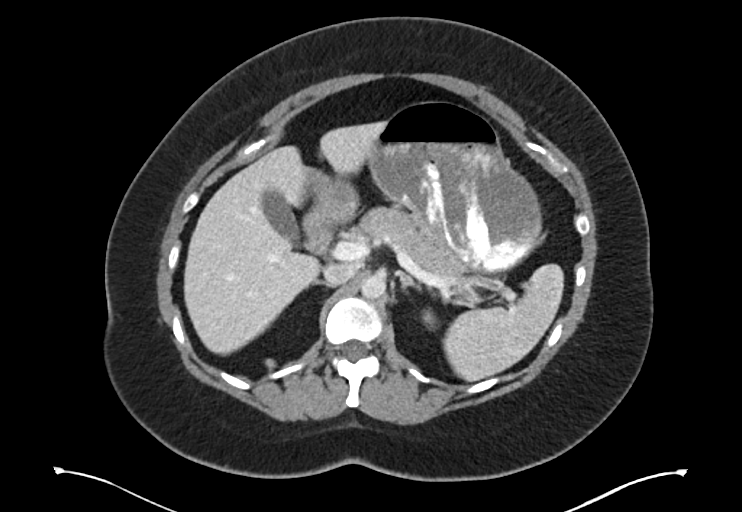
[im 79/94  soft-tissue]
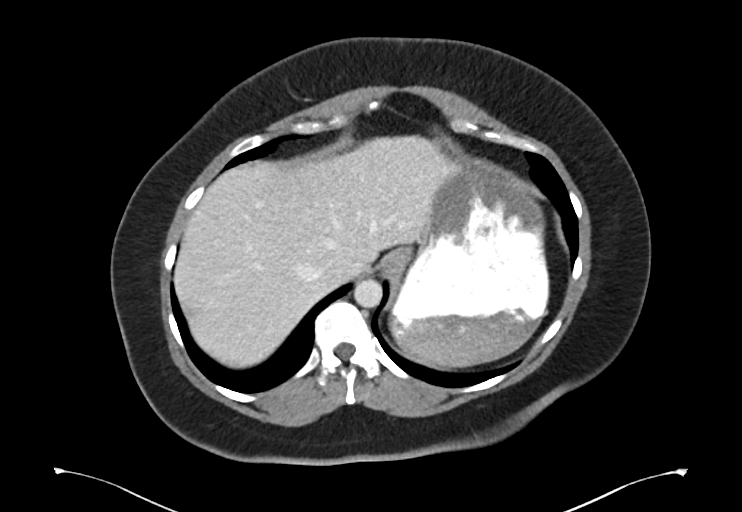
[im 89/94  soft-tissue]
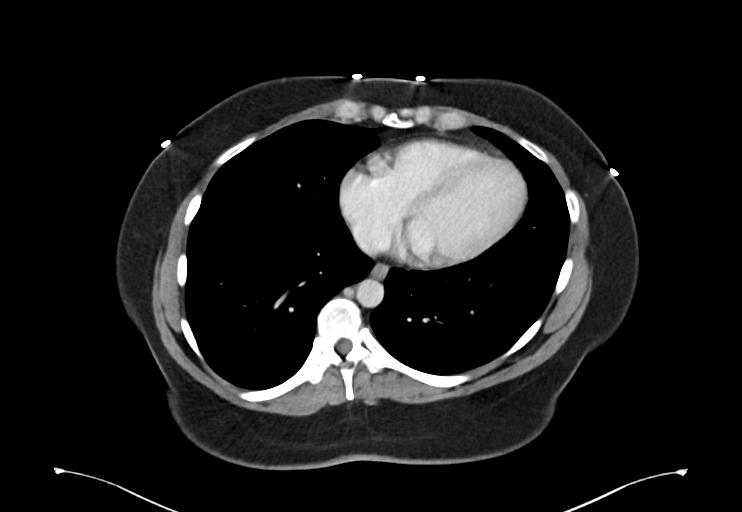
[im 89/94  bone]
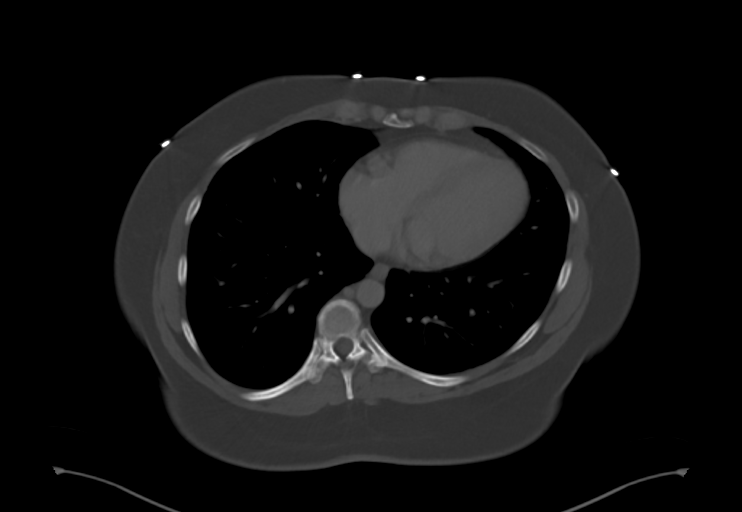

[Series 6: abd pelvis 2.00 br40 s3 cor · coronal · 0.92mm/px · 3 of 169 slices shown]
[im 57/169  soft-tissue]
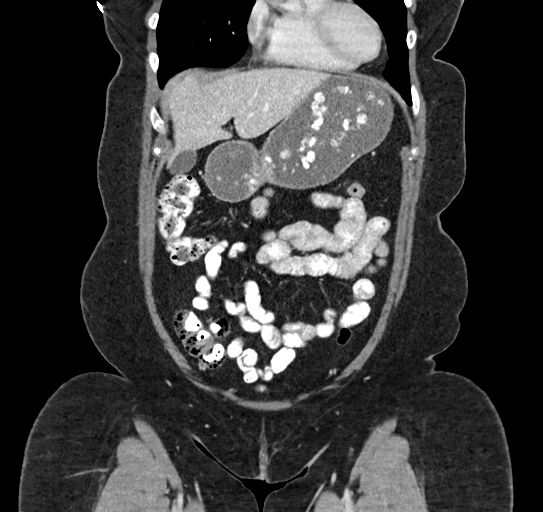
[im 75/169  soft-tissue]
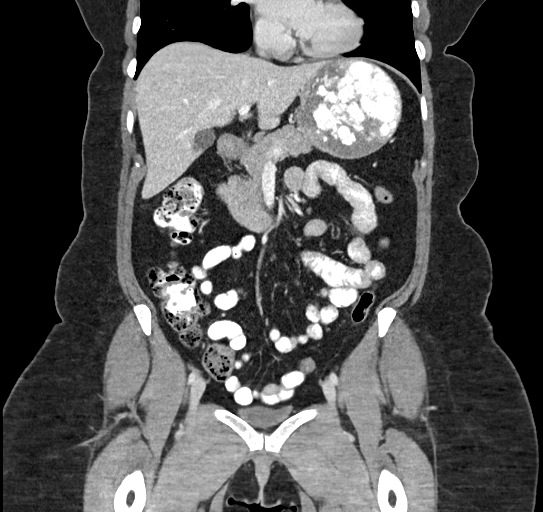
[im 94/169  soft-tissue]
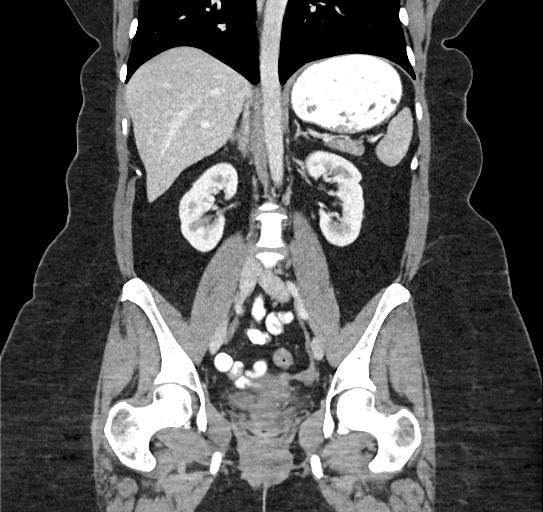

[12 of 46 positions shown; findings below may reference images not displayed]

FINDINGS: Lower chest: Lung bases clear.

Hepatobiliary: Tiny hepatic cysts. Gallbladder and liver otherwise
normal.

Pancreas: Normal appearance

Spleen: Normal appearance

Adrenals/Urinary Tract: Adrenal glands, kidneys, ureters and bladder
normal appearance.

Stomach/Bowel: Normal appendix. Stomach and bowel loops normal
appearance

Vascular/Lymphatic: Vascular structures patent.  No adenopathy.

Reproductive: Uterus and ovaries unremarkable

Other: No free air or free fluid. No hernia or inflammatory process.

Musculoskeletal: Mild thoracolumbar scoliosis.
IMPRESSION: No acute intra-abdominal or intrapelvic abnormalities.

## 2021-06-12 MED ORDER — IOPAMIDOL (ISOVUE-300) INJECTION 61%
100.0000 mL | Freq: Once | INTRAVENOUS | Status: AC | PRN
  Administered 2021-06-12: 100 mL via INTRAVENOUS

## 2021-06-26 DIAGNOSIS — K589 Irritable bowel syndrome without diarrhea: Secondary | ICD-10-CM | POA: Diagnosis not present

## 2021-06-26 DIAGNOSIS — Z1211 Encounter for screening for malignant neoplasm of colon: Secondary | ICD-10-CM | POA: Diagnosis not present

## 2021-07-26 ENCOUNTER — Other Ambulatory Visit: Payer: 59 | Admitting: Rheumatology

## 2021-07-26 DIAGNOSIS — M059 Rheumatoid arthritis with rheumatoid factor, unspecified: Secondary | ICD-10-CM

## 2021-08-04 NOTE — Progress Notes (Signed)
Office Visit Note  Patient: Taylor Bennett             Date of Birth: Jan 23, 1981           MRN: [id number]             PCP: Lennie Odor, PA Referring: Lennie Odor, PA Visit Date: 08/10/2021 Occupation: @GUAROCC @  Subjective:  Rheumatoid Arthritis (Bil hand and wrist pain and swelling, bil elbow pain)   History of Present Illness: Taylor Bennett is a 40 y.o. female with history of seropositive rheumatoid arthritis, osteoarthritis and myofascial pain syndrome.  She states she has been having intermittent pain and swelling in her bilateral hands and wrist joints.  She also has been experiencing discomfort in her elbows.  She continues to have discomfort in her knee joints.  She has been using braces for her knee joints.  She has constant pain in her entire spine.  She continues to have generalized discomfort.  Activities of Daily Living:  Patient reports morning stiffness for 1 hour.   Patient Reports nocturnal pain.  Difficulty dressing/grooming: Reports Difficulty climbing stairs: Denies Difficulty getting out of chair: Reports Difficulty using hands for taps, buttons, cutlery, and/or writing: Reports  Review of Systems  Constitutional:  Positive for fatigue.  HENT:  Positive for mouth dryness.   Eyes:  Positive for dryness.  Respiratory:  Negative for shortness of breath.   Cardiovascular:  Positive for swelling in legs/feet.  Gastrointestinal:  Positive for constipation and diarrhea.  Endocrine: Positive for cold intolerance, heat intolerance, excessive thirst and increased urination.  Genitourinary:  Negative for difficulty urinating.  Musculoskeletal:  Positive for joint pain, joint pain, joint swelling, muscle weakness, morning stiffness and muscle tenderness.  Skin:  Negative for color change, rash and sensitivity to sunlight.  Allergic/Immunologic: Positive for susceptible to infections.  Neurological:  Positive for numbness and weakness.  Hematological:   Positive for bruising/bleeding tendency. Negative for swollen glands.  Psychiatric/Behavioral:  Positive for sleep disturbance.    PMFS History:  Patient Active Problem List   Diagnosis Date Noted   Paresthesia 11/30/2016   Neck pain 10/23/2016   Left cervical radiculopathy 10/23/2016   Vitamin D deficiency 08/10/2016   PCOS (polycystic ovarian syndrome) 06/16/2013   Migraines 06/16/2013   Hypothyroidism 06/10/2013    Past Medical History:  Diagnosis Date   Back pain    Cervical pain    Cervical spondylolysis    DDD (degenerative disc disease), lumbar    PCOS (polycystic ovarian syndrome)    Rheumatoid arthritis ([place])    Scoliosis    Thyroid disease     Family History  Problem Relation Age of Onset   Thyroid disease Mother    Arthritis Mother    Diabetes Father    Congestive Heart Failure Father    Alcoholism Father    Neuropathy Father    Arthritis Father    Osteoarthritis Maternal Aunt    Rheum arthritis Paternal Grandmother    Healthy Daughter    Polycystic ovary syndrome Daughter    Past Surgical History:  Procedure Laterality Date   ANKLE SURGERY  2009   ORIF    THYROIDECTOMY     WISDOM TOOTH EXTRACTION     Social History   Social History Narrative   Lives at home with her fiance and children.   Right-handed.   1-2 cup caffeine daily.   Immunization History  Administered Date(s) Administered   Influenza-Unspecified 06/03/2014, 06/20/2015   Moderna Sars-Covid-2 Vaccination 10/02/2019, 11/02/2019,  07/09/2020     Objective: Vital Signs: BP 122/71 (BP Location: Left Arm, Patient Position: Sitting, Cuff Size: Normal)   Pulse 78   Resp 15   Ht 5\' 3"  (1.6 m)   Wt 209 lb (94.8 kg)   BMI 37.02 kg/m    Physical Exam Vitals and nursing note reviewed.  Constitutional:      Appearance: She is well-developed.  HENT:     Head: Normocephalic and atraumatic.  Eyes:     Conjunctiva/sclera: Conjunctivae normal.  Cardiovascular:     Rate and Rhythm:  Normal rate and regular rhythm.     Heart sounds: Normal heart sounds.  Pulmonary:     Effort: Pulmonary effort is normal.     Breath sounds: Normal breath sounds.  Abdominal:     General: Bowel sounds are normal.     Palpations: Abdomen is soft.  Musculoskeletal:     Cervical back: Normal range of motion.  Lymphadenopathy:     Cervical: No cervical adenopathy.  Skin:    General: Skin is warm and dry.     Capillary Refill: Capillary refill takes less than 2 seconds.  Neurological:     Mental Status: She is alert and oriented to person, place, and time.  Psychiatric:        Behavior: Behavior normal.     Musculoskeletal Exam: C-spine was in good range of motion with some stiffness.  She had discomfort with range of motion of her lumbar spine.  Shoulder joints, elbow joints, wrist joints with good range of motion.  She had discomfort from palpation of her bilateral wrist joints and MCP joints.  Synovial thickening was noted over the left third MCP joint.  Hip joints, knee joints, ankles with good range of motion.  She had no tenderness over ankles or MTPs.  CDAI Exam: CDAI Score: 6.8  Patient Global: 4 mm; Provider Global: 4 mm Swollen: 0 ; Tender: 6  Joint Exam 08/10/2021      Right  Left  Wrist      Tender  MCP 3   Tender   Tender  MCP 4      Tender  PIP 3      Tender  PIP 4      Tender     Investigation: No additional findings.  Imaging: [place] COMPLETE JOINT SPACE STRUCTURES UP BILAT  Result Date: 08/10/2021 Ultrasound examination of bilateral hands was performed per EULAR recommendations. Using 12 MHz transducer, grayscale and power Doppler bilateral second, third, and fifth MCP joints and bilateral wrist joints both dorsal and volar aspects were evaluated to look for synovitis or tenosynovitis. The findings were there was synovitis over bilateral second MCPs, left third MCP and bilateral fifth MCPs.  Synovitis was also noted in bilateral wrist joints on ultrasound  examination. Right median nerve was 0.09 cm squares which was normal limits and left median nerve was 0.09 cm squares which was within normal limits. Impression: Ultrasound examination showed synovitis in bilateral MCPs and wrist joints.  Bilateral median nerves are within normal limits.   Recent Labs: Lab Results  Component Value Date   WBC 7.2 05/01/2021   HGB 13.2 05/01/2021   PLT 358 05/01/2021   NA 141 05/01/2021   K 4.0 05/01/2021   CL 108 05/01/2021   CO2 25 05/01/2021   GLUCOSE 83 05/01/2021   BUN 11 05/01/2021   CREATININE 0.74 05/01/2021   BILITOT 0.5 05/01/2021   ALKPHOS 58 07/19/2015   AST 15 05/01/2021  ALT 12 05/01/2021   PROT 6.3 05/01/2021   ALBUMIN 3.6 07/19/2015   CALCIUM 8.8 05/01/2021   GFRAA 104 12/08/2020   February 25, 2021 chest x-ray normal  Speciality Comments: PLQ EYe Exam: 07/28/2020 WNL @ [facility].   Procedures:  No procedures performed Allergies: Patient has no known allergies.   Assessment / Plan:     Visit Diagnoses: Seropositive rheumatoid arthritis ([place]) - +14 3 3  eta, elevated sedimentation rate in March 2021, +synovitis on ultrasound: She continues to have pain and discomfort despite being on hydroxychloroquine.  She is gives history of intermittent swelling and discomfort in her bilateral hands and wrist joints.  She has been also experiencing discomfort in her elbows and her neck.  She complains of lower back pain.  She has synovial thickening on palpation of her right third MCP joint.  On ultrasound examination today she had synovitis in bilateral MCPs and wrist joints.  I detailed discussion with the patient regarding different treatment options and their side effects.  After indication side effects contraindications were discussed she was in agreement to proceed with methotrexate.  A handout was given and consent was taken.  I plan to start her on methotrexate after the lab results are available.  Her dose will be methotrexate  2.5 mg tablet, 6 tablets p.o. weekly for [redacted] weeks along with folic acid 2 mg p.o. daily.  If her labs are stable in 2 weeks we will increase the dose to 8 tablets p.o. weekly.  We will check labs in 2 weeks again and then every 3 months to monitor for drug toxicity.  Abstinence from alcohol was discussed.    Drug Counseling TB Gold: Patient had TB Gold at her work.  She will send [place] a copy. Hepatitis panel: Pending  Chest-xray: 10/28/2020  Contraception: NuvaRing  Alcohol use: None  Patient was counseled on the purpose, proper use, and adverse effects of methotrexate including nausea, infection, and signs and symptoms of pneumonitis.  Reviewed instructions with patient to take methotrexate weekly along with folic acid daily.  Discussed the importance of frequent monitoring of kidney and liver function and blood counts, and provided patient with standing lab instructions.  Counseled patient to avoid NSAIDs and alcohol while on methotrexate.  Provided patient with educational materials on methotrexate and answered all questions.  Advised patient to get annual influenza vaccine and to get a pneumococcal vaccine if patient has not already had one.  Patient voiced understanding.  Patient consented to methotrexate use.  Will upload into chart.     High risk medication use - Plaquenil 200 mg twice daily Monday through Friday. PLQ Eye Exam: 07/28/2020. - Plan: Hepatitis B surface antigen, Hepatitis C antibody, Hepatitis B core antibody, IgM, Serum protein electrophoresis with reflex, IgG, IgA, IgM, HIV Antibody (routine testing w rflx) today.  She will get labs in 2 weeks x 2 and then every 3 months to monitor for drug toxicity.  Information on immunization was placed in the AVS.  She was advised to hold methotrexate in case she develops an infection and restart after the infection resolves.  Primary osteoarthritis of both hands -as she has been experiencing pain and discomfort in bilateral hands and  intermittent swelling and ultrasound of bilateral hands was performed.  The ultrasound results are documented as above.  Plan: [place] COMPLETE JOINT SPACE STRUCTURES UP BILAT  Chondromalacia patellae, left knee-chronic pain.  Primary osteoarthritis of both feet-she has discomfort in her feet off and on.  Chronic SI joint pain-chronic pain  DDD (degenerative disc disease), cervical-chronic pain she has some stiffness range of motion of her cervical spine  Other idiopathic scoliosis, thoracic region-she gives history of chronic pain  DDD (degenerative disc disease), lumbar-chronic pain.  Myofascial pain-she has generalized pain and hyperalgesia.  She also has positive tender points.  Plantar fasciitis, bilateral-she has intermittent symptoms.  Trochanteric bursitis of both hips-IT band stretches were discussed.  With medical problems suggested as follows:  PCOS (polycystic ovarian syndrome)  Hx of migraines  Vitamin D deficiency  Postoperative hypothyroidism  Family history of rheumatoid arthritis  Orders: Orders Placed This Encounter  Procedures   [place] COMPLETE JOINT SPACE STRUCTURES UP BILAT   Hepatitis B surface antigen   Hepatitis C antibody   Hepatitis B core antibody, IgM   Serum protein electrophoresis with reflex   IgG, IgA, IgM   HIV Antibody (routine testing w rflx)   No orders of the defined types were placed in this encounter.   Follow-Up Instructions: Return in about 6 weeks (around 09/21/2021) for Rheumatoid arthritis, Osteoarthritis.   Bo Merino, MD  Note - This record has been created using Editor, commissioning.  Chart creation errors have been sought, but may not always  have been located. Such creation errors do not reflect on  the standard of medical care.

## 2021-08-07 ENCOUNTER — Other Ambulatory Visit (HOSPITAL_COMMUNITY): Payer: Self-pay

## 2021-08-07 ENCOUNTER — Telehealth: Payer: Self-pay

## 2021-08-07 NOTE — Telephone Encounter (Signed)
Patient called stating she missed her ultrasound appointment and is asking if she can have the ultrasound at her appointment on Thursday, 08/10/21 at 2:45 pm.

## 2021-08-07 NOTE — Telephone Encounter (Signed)
If we have any cancellations then we can add [place].

## 2021-08-08 ENCOUNTER — Other Ambulatory Visit (HOSPITAL_COMMUNITY): Payer: Self-pay

## 2021-08-08 NOTE — Telephone Encounter (Signed)
Left message to advise patient we can do both the ultrasound and her appointment on 08/10/2021. Patient advised we would need to move her appointment time to 3:15 pm. Patient advised to contact the office if she is unable to keep that appointment.

## 2021-08-10 ENCOUNTER — Telehealth: Payer: Self-pay

## 2021-08-10 ENCOUNTER — Ambulatory Visit: Payer: Self-pay

## 2021-08-10 ENCOUNTER — Ambulatory Visit: Payer: 59 | Admitting: Rheumatology

## 2021-08-10 ENCOUNTER — Other Ambulatory Visit: Payer: Self-pay

## 2021-08-10 ENCOUNTER — Encounter: Payer: Self-pay | Admitting: Rheumatology

## 2021-08-10 VITALS — BP 122/71 | HR 78 | Resp 15 | Ht 63.0 in | Wt 209.0 lb

## 2021-08-10 DIAGNOSIS — M19072 Primary osteoarthritis, left ankle and foot: Secondary | ICD-10-CM

## 2021-08-10 DIAGNOSIS — E890 Postprocedural hypothyroidism: Secondary | ICD-10-CM

## 2021-08-10 DIAGNOSIS — M19071 Primary osteoarthritis, right ankle and foot: Secondary | ICD-10-CM | POA: Diagnosis not present

## 2021-08-10 DIAGNOSIS — M2242 Chondromalacia patellae, left knee: Secondary | ICD-10-CM | POA: Diagnosis not present

## 2021-08-10 DIAGNOSIS — M7061 Trochanteric bursitis, right hip: Secondary | ICD-10-CM

## 2021-08-10 DIAGNOSIS — Z79899 Other long term (current) drug therapy: Secondary | ICD-10-CM

## 2021-08-10 DIAGNOSIS — M4124 Other idiopathic scoliosis, thoracic region: Secondary | ICD-10-CM

## 2021-08-10 DIAGNOSIS — M5136 Other intervertebral disc degeneration, lumbar region: Secondary | ICD-10-CM | POA: Diagnosis not present

## 2021-08-10 DIAGNOSIS — E282 Polycystic ovarian syndrome: Secondary | ICD-10-CM

## 2021-08-10 DIAGNOSIS — Z8261 Family history of arthritis: Secondary | ICD-10-CM

## 2021-08-10 DIAGNOSIS — M7062 Trochanteric bursitis, left hip: Secondary | ICD-10-CM

## 2021-08-10 DIAGNOSIS — G8929 Other chronic pain: Secondary | ICD-10-CM

## 2021-08-10 DIAGNOSIS — M7918 Myalgia, other site: Secondary | ICD-10-CM

## 2021-08-10 DIAGNOSIS — E559 Vitamin D deficiency, unspecified: Secondary | ICD-10-CM

## 2021-08-10 DIAGNOSIS — M19042 Primary osteoarthritis, left hand: Secondary | ICD-10-CM

## 2021-08-10 DIAGNOSIS — M19041 Primary osteoarthritis, right hand: Secondary | ICD-10-CM

## 2021-08-10 DIAGNOSIS — M059 Rheumatoid arthritis with rheumatoid factor, unspecified: Secondary | ICD-10-CM | POA: Diagnosis not present

## 2021-08-10 DIAGNOSIS — M533 Sacrococcygeal disorders, not elsewhere classified: Secondary | ICD-10-CM | POA: Diagnosis not present

## 2021-08-10 DIAGNOSIS — M5030 Other cervical disc degeneration, unspecified cervical region: Secondary | ICD-10-CM | POA: Diagnosis not present

## 2021-08-10 DIAGNOSIS — M722 Plantar fascial fibromatosis: Secondary | ICD-10-CM

## 2021-08-10 DIAGNOSIS — Z8669 Personal history of other diseases of the nervous system and sense organs: Secondary | ICD-10-CM

## 2021-08-10 NOTE — Patient Instructions (Addendum)
Methotrexate Tablets What is this medication? METHOTREXATE (METH oh TREX ate) treats inflammatory conditions such as arthritis and psoriasis. It works by decreasing inflammation, which can reduce pain and prevent long-term injury to the joints and skin. It may also be used to treat some types of cancer. It works by slowing down the growth of cancer cells. This medicine may be used for other purposes; ask your health care provider or pharmacist if you have questions. COMMON BRAND NAME(S): Rheumatrex, Trexall What should I tell my care team before I take this medication? They need to know if you have any of these conditions: Fluid in the stomach area or lungs If you often drink alcohol Infection or immune system problems Kidney disease or on hemodialysis Liver disease Low blood counts, like low white cell, platelet, or red cell counts Lung disease Radiation therapy Stomach ulcers Ulcerative colitis An unusual or allergic reaction to methotrexate, other medications, foods, dyes, or preservatives Pregnant or trying to get pregnant Breast-feeding How should I use this medication? Take this medication by mouth with a glass of water. Follow the directions on the prescription label. Take your medication at regular intervals. Do not take it more often than directed. Do not stop taking except on your care team's advice. Make sure you know why you are taking this medication and how often you should take it. If this medication is used for a condition that is not cancer, like arthritis or psoriasis, it should be taken weekly, NOT daily. Taking this medication more often than directed can cause serious side effects, even death. Talk to your care team about safe handling and disposal of this medication. You may need to take special precautions. Talk to your care team about the use of this medication in children. While this medication may be prescribed for selected conditions, precautions do  apply. Overdosage: If you think you have taken too much of this medicine contact a poison control center or emergency room at once. NOTE: This medicine is only for you. Do not share this medicine with others. What if I miss a dose? If you miss a dose, talk with your care team. Do not take double or extra doses. What may interact with this medication? Do not take this medication with any of the following: Acitretin This medication may also interact with the following: Aspirin and aspirin-like medications including salicylates Azathioprine Certain antibiotics like penicillins, tetracycline, and chloramphenicol Certain medications that treat or prevent blood clots like warfarin, apixaban, dabigatran, and rivaroxaban Certain medications for stomach problems like esomeprazole, omeprazole, pantoprazole Cyclosporine Dapsone Diuretics Gold Hydroxychloroquine Live virus vaccines Medications for infection like acyclovir, adefovir, amphotericin B, bacitracin, cidofovir, foscarnet, ganciclovir, gentamicin, pentamidine, vancomycin Mercaptopurine NSAIDs, medications for pain and inflammation, like ibuprofen or naproxen Other cytotoxic agents Pamidronate Pemetrexed Penicillamine Phenylbutazone Phenytoin Probenecid Pyrimethamine Retinoids such as isotretinoin and tretinoin Steroid medications like prednisone or cortisone Sulfonamides like sulfasalazine and trimethoprim/sulfamethoxazole Theophylline Zoledronic acid This list may not describe all possible interactions. Give your health care provider a list of all the medicines, herbs, non-prescription drugs, or dietary supplements you use. Also tell them if you smoke, drink alcohol, or use illegal drugs. Some items may interact with your medicine. What should I watch for while using this medication? Avoid alcoholic drinks. This medication can make you more sensitive to the sun. Keep out of the sun. If you cannot avoid being in the sun, wear  protective clothing and use sunscreen. Do not use sun lamps or tanning beds/booths. You may need   blood work done while you are taking this medication. Call your care team for advice if you get a fever, chills or sore throat, or other symptoms of a cold or flu. Do not treat yourself. This medication decreases your body's ability to fight infections. Try to avoid being around people who are sick. This medication may increase your risk to bruise or bleed. Call your care team if you notice any unusual bleeding. Be careful brushing or flossing your teeth or using a toothpick because you may get an infection or bleed more easily. If you have any dental work done, tell your dentist you are receiving this medication. Check with your care team if you get an attack of severe diarrhea, nausea and vomiting, or if you sweat a lot. The loss of too much body fluid can make it dangerous for you to take this medication. Talk to your care team about your risk of cancer. You may be more at risk for certain types of cancers if you take this medication. Do not become pregnant while taking this medication or for 6 months after stopping it. Women should inform their care team if they wish to become pregnant or think they might be pregnant. Men should not father a child while taking this medication and for 3 months after stopping it. There is potential for serious harm to an unborn child. Talk to your care team for more information. Do not breast-feed an infant while taking this medication or for 1 week after stopping it. This medication may make it more difficult to get pregnant or father a child. Talk to your care team if you are concerned about your fertility. What side effects may I notice from receiving this medication? Side effects that you should report to your care team as soon as possible: Allergic reactions--skin rash, itching, hives, swelling of the face, lips, tongue, or throat Blood clot--pain, swelling, or warmth  in the leg, shortness of breath, chest pain Dry cough, shortness of breath or trouble breathing Infection--fever, chills, cough, sore throat, wounds that don't heal, pain or trouble when passing urine, general feeling of discomfort or being unwell Kidney injury--decrease in the amount of urine, swelling of the ankles, hands, or feet Liver injury--right upper belly pain, loss of appetite, nausea, light-colored stool, dark yellow or brown urine, yellowing of the skin or eyes, unusual weakness or fatigue Low red blood cell count--unusual weakness or fatigue, dizziness, headache, trouble breathing Redness, blistering, peeling, or loosening of the skin, including inside the mouth Seizures Unusual bruising or bleeding Side effects that usually do not require medical attention (report to your care team if they continue or are bothersome): Diarrhea Dizziness Hair loss Nausea Pain, redness, or swelling with sores inside the mouth or throat Vomiting This list may not describe all possible side effects. Call your doctor for medical advice about side effects. You may report side effects to FDA at 1-800-FDA-1088. Where should I keep my medication? Keep out of the reach of children and pets. Store at room temperature between 20 and 25 degrees C (68 and 77 degrees F). Protect from light. Get rid of any unused medication after the expiration date. Talk to your care team about how to dispose of unused medication. Special directions may apply. NOTE: This sheet is a summary. It may not cover all possible information. If you have questions about this medicine, talk to your doctor, pharmacist, or health care provider.  2022 Elsevier/Gold Standard (2020-10-31 00:00:00)   Standing Labs We placed an  order today for your standing lab work.   Please have your standing labs drawn in 2 weeks x2, 2 months and then every 3 months.   If possible, please have your labs drawn 2 weeks prior to your appointment so that  the provider can discuss your results at your appointment.  Please note that you may see your imaging and lab results in [place] before we have reviewed them. We may be awaiting multiple results to interpret others before contacting you. Please allow our office up to 72 hours to thoroughly review all of the results before contacting the office for clarification of your results.  We have open lab daily: Monday through Thursday from 1:30-4:30 PM and Friday from 1:30-4:00 PM at the office of Dr. Bo Merino, [facility].   Please be advised, all patients with office appointments requiring lab work will take precedent over walk-in lab work.  If possible, please come for your lab work on Monday and Friday afternoons, as you may experience shorter wait times. The office is located at [address] [place], [place], [place] [address] No appointment is necessary.   Labs are drawn by [facility]. Please bring your co-pay at the time of your lab draw.  You may receive a bill from [facility] for your lab work.  If you wish to have your labs drawn at another location, please call the office 24 hours in advance to send orders.  If you have any questions regarding directions or hours of operation,  please call [phone].   As a reminder, please drink plenty of water prior to coming for your lab work. Thanks!    Vaccines You are taking a medication(s) that can suppress your immune system.  The following immunizations are recommended: Flu annually Covid-19  Td/Tdap (tetanus, diphtheria, pertussis) every 10 years Pneumonia (Prevnar 15 then Pneumovax 23 at least 1 year apart.  Alternatively, can take Prevnar 20 without needing additional dose) Shingrix: 2 doses from 4 weeks to 6 months apart  Please check with your PCP to make sure you are up to date.

## 2021-08-10 NOTE — Telephone Encounter (Signed)
Patient will be starting methotrexate tablets and folic acid pending lab results, per Dr. Estanislado Pandy.   Patient will obtain a copy of recent TB Gold test and send to our office.   Thanks!

## 2021-08-14 LAB — HEPATITIS C ANTIBODY
Hepatitis C Ab: NONREACTIVE
SIGNAL TO CUT-OFF: 0.21 (ref <1.00)

## 2021-08-14 LAB — IGG, IGA, IGM
IgG (Immunoglobin G), Serum: 991 mg/dL (ref 600–1640)
IgM, Serum: 61 mg/dL (ref 50–300)
Immunoglobulin A: 149 mg/dL (ref 47–310)

## 2021-08-14 LAB — PROTEIN ELECTROPHORESIS, SERUM, WITH REFLEX
Albumin ELP: 3.7 g/dL — ABNORMAL LOW (ref 3.8–4.8)
Alpha 1: 0.4 g/dL — ABNORMAL HIGH (ref 0.2–0.3)
Alpha 2: 0.8 g/dL (ref 0.5–0.9)
Beta 2: 0.4 g/dL (ref 0.2–0.5)
Beta Globulin: 0.6 g/dL (ref 0.4–0.6)
Gamma Globulin: 0.9 g/dL (ref 0.8–1.7)
Total Protein: 6.8 g/dL (ref 6.1–8.1)

## 2021-08-14 LAB — HEPATITIS B CORE ANTIBODY, IGM: Hep B C IgM: NONREACTIVE

## 2021-08-14 LAB — HIV ANTIBODY (ROUTINE TESTING W REFLEX): HIV 1&2 Ab, 4th Generation: NONREACTIVE

## 2021-08-14 LAB — HEPATITIS B SURFACE ANTIGEN: Hepatitis B Surface Ag: NONREACTIVE

## 2021-08-14 NOTE — Telephone Encounter (Signed)
TB Gold: 07/21/2021 Negative  Labs have results.   Patient states she is going through a lot of GI issues already. Patient states she has a colonoscopy scheduled for February 2023. Patient states she is concerned about the side effects of the MTX. Patient states she would like to know if she can wait until she has ruled out everything with GI. Please advise.

## 2021-08-15 NOTE — Telephone Encounter (Signed)
Yes, she may wait to start on methotrexate until GI work-up is complete.

## 2021-08-15 NOTE — Telephone Encounter (Signed)
Patient advised that, yes, she may wait to start on methotrexate until GI work-up is complete.

## 2021-08-29 DIAGNOSIS — R079 Chest pain, unspecified: Secondary | ICD-10-CM | POA: Diagnosis not present

## 2021-08-30 DIAGNOSIS — H40013 Open angle with borderline findings, low risk, bilateral: Secondary | ICD-10-CM | POA: Diagnosis not present

## 2021-08-30 DIAGNOSIS — H5213 Myopia, bilateral: Secondary | ICD-10-CM | POA: Diagnosis not present

## 2021-08-30 DIAGNOSIS — H16423 Pannus (corneal), bilateral: Secondary | ICD-10-CM | POA: Diagnosis not present

## 2021-09-01 ENCOUNTER — Other Ambulatory Visit (HOSPITAL_COMMUNITY): Payer: Self-pay

## 2021-09-01 MED ORDER — CARESTART COVID-19 HOME TEST VI KIT
PACK | 0 refills | Status: DC
Start: 2021-09-01 — End: 2022-01-08
  Filled 2021-09-01: qty 4, 4d supply, fill #0

## 2021-09-07 NOTE — Progress Notes (Deleted)
Office Visit Note  Patient: Taylor Bennett             Date of Birth: 1980-11-28           MRN: [id number]             PCP: Lennie Odor, PA Referring: Lennie Odor, PA Visit Date: 09/21/2021 Occupation: @GUAROCC @  Subjective:  No chief complaint on file.   History of Present Illness: Taylor Bennett is a 40 y.o. female ***   Activities of Daily Living:  Patient reports morning stiffness for *** {minute/hour:19697}.   Patient {ACTIONS;DENIES/REPORTS:21021675::"Denies"} nocturnal pain.  Difficulty dressing/grooming: {ACTIONS;DENIES/REPORTS:21021675::"Denies"} Difficulty climbing stairs: {ACTIONS;DENIES/REPORTS:21021675::"Denies"} Difficulty getting out of chair: {ACTIONS;DENIES/REPORTS:21021675::"Denies"} Difficulty using hands for taps, buttons, cutlery, and/or writing: {ACTIONS;DENIES/REPORTS:21021675::"Denies"}  No Rheumatology ROS completed.   PMFS History:  Patient Active Problem List   Diagnosis Date Noted   Paresthesia 11/30/2016   Neck pain 10/23/2016   Left cervical radiculopathy 10/23/2016   Vitamin D deficiency 08/10/2016   PCOS (polycystic ovarian syndrome) 06/16/2013   Migraines 06/16/2013   Hypothyroidism 06/10/2013    Past Medical History:  Diagnosis Date   Back pain    Cervical pain    Cervical spondylolysis    DDD (degenerative disc disease), lumbar    PCOS (polycystic ovarian syndrome)    Rheumatoid arthritis ([place])    Scoliosis    Thyroid disease     Family History  Problem Relation Age of Onset   Thyroid disease Mother    Arthritis Mother    Diabetes Father    Congestive Heart Failure Father    Alcoholism Father    Neuropathy Father    Arthritis Father    Osteoarthritis Maternal Aunt    Rheum arthritis Paternal Grandmother    Healthy Daughter    Polycystic ovary syndrome Daughter    Past Surgical History:  Procedure Laterality Date   ANKLE SURGERY  2009   ORIF    THYROIDECTOMY     WISDOM TOOTH EXTRACTION     Social  History   Social History Narrative   Lives at home with her fiance and children.   Right-handed.   1-2 cup caffeine daily.   Immunization History  Administered Date(s) Administered   Influenza-Unspecified 06/03/2014, 06/20/2015   Moderna Sars-Covid-2 Vaccination 10/02/2019, 11/02/2019, 07/09/2020     Objective: Vital Signs: There were no vitals taken for this visit.   Physical Exam   Musculoskeletal Exam: ***  CDAI Exam: CDAI Score: -- Patient Global: --; Provider Global: -- Swollen: --; Tender: -- Joint Exam 09/21/2021   No joint exam has been documented for this visit   There is currently no information documented on the homunculus. Go to the Rheumatology activity and complete the homunculus joint exam.  Investigation: No additional findings.  Imaging: [place] COMPLETE JOINT SPACE STRUCTURES UP BILAT  Result Date: 08/10/2021 Ultrasound examination of bilateral hands was performed per EULAR recommendations. Using 12 MHz transducer, grayscale and power Doppler bilateral second, third, and fifth MCP joints and bilateral wrist joints both dorsal and volar aspects were evaluated to look for synovitis or tenosynovitis. The findings were there was synovitis over bilateral second MCPs, left third MCP and bilateral fifth MCPs.  Synovitis was also noted in bilateral wrist joints on ultrasound examination. Right median nerve was 0.09 cm squares which was normal limits and left median nerve was 0.09 cm squares which was within normal limits. Impression: Ultrasound examination showed synovitis in bilateral MCPs and wrist joints.  Bilateral median nerves are within normal  limits.   Recent Labs: Lab Results  Component Value Date   WBC 7.2 05/01/2021   HGB 13.2 05/01/2021   PLT 358 05/01/2021   NA 141 05/01/2021   K 4.0 05/01/2021   CL 108 05/01/2021   CO2 25 05/01/2021   GLUCOSE 83 05/01/2021   BUN 11 05/01/2021   CREATININE 0.74 05/01/2021   BILITOT 0.5 05/01/2021   ALKPHOS 58  07/19/2015   AST 15 05/01/2021   ALT 12 05/01/2021   PROT 6.8 08/10/2021   ALBUMIN 3.6 07/19/2015   CALCIUM 8.8 05/01/2021   GFRAA 104 12/08/2020    Speciality Comments: PLQ EYe Exam: 07/28/2020 WNL @ [facility].   TB Gold: 07/21/2021 Negative  Procedures:  No procedures performed Allergies: Patient has no known allergies.   Assessment / Plan:     Visit Diagnoses: No diagnosis found.  Orders: No orders of the defined types were placed in this encounter.  No orders of the defined types were placed in this encounter.   Face-to-face time spent with patient was *** minutes. Greater than 50% of time was spent in counseling and coordination of care.  Follow-Up Instructions: No follow-ups on file.   Earnestine Mealing, CMA  Note - This record has been created using Editor, commissioning.  Chart creation errors have been sought, but may not always  have been located. Such creation errors do not reflect on  the standard of medical care.

## 2021-09-12 ENCOUNTER — Other Ambulatory Visit (HOSPITAL_COMMUNITY): Payer: Self-pay

## 2021-09-12 ENCOUNTER — Other Ambulatory Visit: Payer: Self-pay | Admitting: Rheumatology

## 2021-09-12 MED ORDER — HYDROXYCHLOROQUINE SULFATE 200 MG PO TABS
200.0000 mg | ORAL_TABLET | Freq: Two times a day (BID) | ORAL | 0 refills | Status: DC
  Filled 2021-09-12: qty 120, 84d supply, fill #0

## 2021-09-12 NOTE — Telephone Encounter (Signed)
Next Visit: 09/21/2021  Last Visit: 08/10/2021  Labs: 05/01/2021 CBC and CMP WNL.  ESR WNL.   Eye exam: 07/28/2020 WNL   Current Dose per office note 08/10/2021: Plaquenil 200 mg twice daily Monday through Friday  CY:ELYHTMBPJPET rheumatoid arthritis   Last Fill: 06/02/2021  Spoke with patient and advised she is due to update her PLQ eye exam. Patient states she had that done last week and will have the eye doctor send records.   Okay to refill Plaquenil?

## 2021-09-14 ENCOUNTER — Telehealth: Payer: Self-pay | Admitting: Rheumatology

## 2021-09-14 NOTE — Telephone Encounter (Signed)
Patient called the office stating she is aware she needs her Plaquenil eye exam for more refills. Patient stated she went for a regular eye exam and wasn't able to get the dilation portion of that exam done the same day. Patient states her appointment to get the dilation done is in 2 weeks and is on her last pill of Plaquenil. Patient requests a 2 week supply if possible to get her through until she has her appointment.

## 2021-09-14 NOTE — Telephone Encounter (Signed)
Patient advised her prescription was sent to the pharmacy on 09/12/2021. Patient states she will bring results to [place] after her eye appointment.

## 2021-09-15 ENCOUNTER — Other Ambulatory Visit (HOSPITAL_COMMUNITY): Payer: Self-pay

## 2021-09-20 ENCOUNTER — Emergency Department (HOSPITAL_BASED_OUTPATIENT_CLINIC_OR_DEPARTMENT_OTHER)
Admission: EM | Admit: 2021-09-20 | Discharge: 2021-09-20 | Disposition: A | Discharge status: Home / Self Care | Payer: 59 | Attending: Emergency Medicine | Admitting: Emergency Medicine

## 2021-09-20 ENCOUNTER — Other Ambulatory Visit: Payer: Self-pay

## 2021-09-20 ENCOUNTER — Encounter (HOSPITAL_BASED_OUTPATIENT_CLINIC_OR_DEPARTMENT_OTHER): Payer: Self-pay | Admitting: *Deleted

## 2021-09-20 DIAGNOSIS — R42 Dizziness and giddiness: Secondary | ICD-10-CM | POA: Diagnosis not present

## 2021-09-20 DIAGNOSIS — R0789 Other chest pain: Secondary | ICD-10-CM | POA: Diagnosis not present

## 2021-09-20 DIAGNOSIS — N938 Other specified abnormal uterine and vaginal bleeding: Secondary | ICD-10-CM

## 2021-09-20 DIAGNOSIS — N939 Abnormal uterine and vaginal bleeding, unspecified: Secondary | ICD-10-CM | POA: Diagnosis not present

## 2021-09-20 DIAGNOSIS — R002 Palpitations: Secondary | ICD-10-CM | POA: Diagnosis not present

## 2021-09-20 LAB — CBC
HCT: 36.8 % (ref 36.0–46.0)
Hemoglobin: 12.4 g/dL (ref 12.0–15.0)
MCH: 31.5 pg (ref 26.0–34.0)
MCHC: 33.7 g/dL (ref 30.0–36.0)
MCV: 93.4 fL (ref 80.0–100.0)
Platelets: 374 10*3/uL (ref 150–400)
RBC: 3.94 MIL/uL (ref 3.87–5.11)
RDW: 11.8 % (ref 11.5–15.5)
WBC: 8.9 10*3/uL (ref 4.0–10.5)
nRBC: 0 % (ref 0.0–0.2)

## 2021-09-20 LAB — URINALYSIS, ROUTINE W REFLEX MICROSCOPIC
Bilirubin Urine: NEGATIVE
Glucose, UA: NEGATIVE mg/dL
Ketones, ur: NEGATIVE mg/dL
Nitrite: NEGATIVE
Protein, ur: 30 mg/dL — AB
RBC / HPF: >50 RBC/hpf — ABNORMAL HIGH (ref 0–5)
Specific Gravity, Urine: 1.021 (ref 1.005–1.030)
pH: 5.5 (ref 5.0–8.0)

## 2021-09-20 LAB — BASIC METABOLIC PANEL
Anion gap: 9 (ref 5–15)
BUN: 8 mg/dL (ref 6–20)
CO2: 26 mmol/L (ref 22–32)
Calcium: 9.1 mg/dL (ref 8.9–10.3)
Chloride: 104 mmol/L (ref 98–111)
Creatinine, Ser: 0.77 mg/dL (ref 0.44–1.00)
GFR, Estimated: >60 mL/min (ref >60)
Glucose, Bld: 105 mg/dL — ABNORMAL HIGH (ref 70–99)
Potassium: 3.8 mmol/L (ref 3.5–5.1)
Sodium: 139 mmol/L (ref 135–145)

## 2021-09-20 LAB — PREGNANCY, URINE: Preg Test, Ur: NEGATIVE

## 2021-09-20 LAB — CBG MONITORING, ED: Glucose-Capillary: 89 mg/dL (ref 70–99)

## 2021-09-20 NOTE — ED Triage Notes (Signed)
Pt states she has had vaginal bleeding since christmas and for the last 3-4 days has had increase in feeling weak; pt states she has been passing moderate sized clots; pt was sent here from [facility] physicians

## 2021-09-20 NOTE — ED Provider Notes (Signed)
[place] EMERGENCY DEPT Provider Note   CSN: [id number] Arrival date & time: 09/20/21  1924     History  Chief Complaint  Patient presents with   Vaginal Bleeding    Taylor Bennett is a 41 y.o. female.  41 year old female presents with several days of vaginal bleeding.  Patient states that she became dizzy and lightheaded.  Did experience some palpitations and chest discomfort.  No exertional component to those symptoms.  No near-syncope or syncope.  Those of been intermittent and have since resolved.  Has had some uterine cramping but denies any discomfort.  No emesis or fever.  Does have history of PCOS and is on Mirena ring.  Went to [place] walk-in clinic today and sent here for further management.      Home Medications Prior to Admission medications   Medication Sig Start Date End Date Taking? Authorizing Provider  Ascorbic Acid (VITAMIN C PO) Take by mouth daily.    [provider]  benzonatate (TESSALON) 100 MG capsule Take 1 capsule (100 mg total) by mouth 3 (three) times daily as needed for cough. Patient not taking: Reported on 05/01/2021 02/25/21   Ward, Lenise Arena, PA-C  brompheniramine-pseudoephedrine-DM 30-2-10 MG/5ML syrup Take 10 mLs by mouth 4 (four) times daily as needed. Patient not taking: Reported on 05/01/2021 02/25/21   Ward, Lenise Arena, PA-C  Cholecalciferol (VITAMIN D PO) Take 5,000 Units by mouth 2 (two) times daily.     [provider]  COVID-19 At Home Antigen Test [facility] COVID-19 HOME TEST) KIT Use as directed 09/01/21   Jefm Bryant, [facility]  COVID-19 At Home Antigen Test [facility] AT-HOME COVID-19 TEST) KIT Use as directed Patient not taking: Reported on 08/10/2021 05/08/21   Jefm Bryant, RPH  dicyclomine (BENTYL) 10 MG capsule Take 1 capsule (10 mg total) by mouth 3 (three) times daily as needed for abdominal pain 05/19/21     etonogestrel-ethinyl estradiol (NUVARING) 0.12-0.015 MG/24HR vaginal ring Insert 1 ring  vaginally; leave in place for 3 weeks, remove, and replace with a new ring.  Take 7 day break after every third ring Vaginal as directed 21 day(s) 12/12/20     hydroxychloroquine (PLAQUENIL) 200 MG tablet Take 1 tablet (200 mg total) by mouth 2 (two) times daily from Monday to Friday. 09/12/21   Ofilia Neas, PA-C  ibuprofen (ADVIL,MOTRIN) 200 MG tablet Takes 640m - 8045mdaily, as needed.    [provider]  loratadine (CLARITIN) 10 MG tablet Take 10 mg by mouth daily.    [provider]  meclizine (ANTIVERT) 25 MG tablet Take 1 tablet (25 mg total) by mouth 3 (three) times daily as needed for dizziness. 07/19/15   NgHarvel QualeMD  Metaxalone (SKELAXIN PO) Take by mouth as needed. Patient not taking: Reported on 05/01/2021    [provider]  metaxalone (SKELAXIN) 800 MG tablet TAKE 1/2 TO 1 TABLET BY MOUTH UP TO 3 TIMES DAILY. 12/07/20 12/07/21  Redmon, NoBarth KirksPA  Multiple Vitamin (MULTIVITAMIN PO) Take by mouth daily.    [provider]  norgestimate-ethinyl estradiol (ORTHO-CYCLEN) 0.25-35 MG-MCG tablet TAKE 1 TABLET BY MOUTH ONCE A DAY Patient not taking: Reported on 05/01/2021 11/07/20 11/07/21  OrMa HillockFNP  NP THYROID 90 MG tablet Take 1 tablet (90 mg total) by mouth daily. 05/04/21   GhPhilemon KingdomMD  rizatriptan (MAXALT) 10 MG tablet Take 10 mg by mouth as needed for migraine. May repeat in 2 hours if needed Patient  not taking: Reported on 05/01/2021    [provider]  rizatriptan (MAXALT) 10 MG tablet Take 1 tablet (10 mg total) by mouth at the onset of headache. May repeat in 2 hours as needed (max: 2 tabs in 24 hours) 12/12/20     rizatriptan (MAXALT) 10 MG tablet take 1 tablet by mouth at the onset of headache may repeat in 2 hours if needed (max 2 tabs in 24 hours) 05/16/21     ZINC CITRATE-PHYTASE PO Take by mouth.    [provider]  gabapentin (NEURONTIN) 100 MG capsule Take 1 capsule (100 mg total) by mouth at  bedtime. Patient not taking: Reported on 10/27/2020 08/17/20 10/27/20  Jessy Oto, MD      Allergies    Patient has no known allergies.    Review of Systems   Review of Systems  All other systems reviewed and are negative.  Physical Exam Updated Vital Signs BP 129/84 (BP Location: Right Arm)    Pulse 98    Temp 98.8 F (37.1 C) (Oral)    Resp 18    Ht 1.6 m ('5\' 3"' )    Wt 93 kg    LMP 09/03/2021    SpO2 100%    BMI 36.31 kg/m  Physical Exam Vitals and nursing note reviewed.  Constitutional:      General: She is not in acute distress.    Appearance: Normal appearance. She is well-developed. She is not toxic-appearing.  HENT:     Head: Normocephalic and atraumatic.  Eyes:     General: Lids are normal.     Conjunctiva/sclera: Conjunctivae normal.     Pupils: Pupils are equal, round, and reactive to light.  Neck:     Thyroid: No thyroid mass.     Trachea: No tracheal deviation.  Cardiovascular:     Rate and Rhythm: Normal rate and regular rhythm.     Heart sounds: Normal heart sounds. No murmur heard.   No gallop.  Pulmonary:     Effort: Pulmonary effort is normal. No respiratory distress.     Breath sounds: Normal breath sounds. No stridor. No decreased breath sounds, wheezing, rhonchi or rales.  Abdominal:     General: There is no distension.     Palpations: Abdomen is soft.     Tenderness: There is no abdominal tenderness. There is no rebound.  Musculoskeletal:        General: No tenderness. Normal range of motion.     Cervical back: Normal range of motion and neck supple.  Skin:    General: Skin is warm and dry.     Findings: No abrasion or rash.  Neurological:     Mental Status: She is alert and oriented to person, place, and time. Mental status is at baseline.     GCS: GCS eye subscore is 4. GCS verbal subscore is 5. GCS motor subscore is 6.     Cranial Nerves: No cranial nerve deficit.     Sensory: No sensory deficit.     Motor: Motor function is intact.   Psychiatric:        Attention and Perception: Attention normal.        Speech: Speech normal.        Behavior: Behavior normal.    ED Results / Procedures / Treatments   Labs (all labs ordered are listed, but only abnormal results are displayed) Labs Reviewed  BASIC METABOLIC PANEL - Abnormal; Notable for the following components:  Result Value   Glucose, Bld 105 (*)    All other components within normal limits  URINALYSIS, ROUTINE W REFLEX MICROSCOPIC - Abnormal; Notable for the following components:   APPearance HAZY (*)    Hgb urine dipstick LARGE (*)    Protein, ur 30 (*)    Leukocytes,Ua SMALL (*)    RBC / HPF >50 (*)    All other components within normal limits  CBC  PREGNANCY, URINE  CBG MONITORING, ED    EKG EKG Interpretation  Date/Time:  Wednesday September 20 2021 20:01:50 EST Ventricular Rate:  102 PR Interval:  120 QRS Duration: 80 QT Interval:  330 QTC Calculation: 430 R Axis:   79 Text Interpretation: Sinus tachycardia Nonspecific T wave abnormality Abnormal ECG No previous ECGs available Confirmed by Lacretia Leigh ([id number]) on 09/20/2021 10:27:01 PM  Radiology No results found.  Procedures Procedures    Medications Ordered in ED Medications - No data to display  ED Course/ Medical Decision Making/ A&P                           Medical Decision Making Pelvic exam deferred per patient.  Patient's hemoglobin is stable here indicate no evidence of anemia.  Patient does not appear to be dehydrated based on her exam or laboratory studies.  Her EKG shows sinus tach at 102.  I offered patient chest x-ray which she has deferred.  Her lungs are clear on exam here.  Do not think that patient has PE or ACS.  Patient is EKG per my interpretation shows nonspecific T wave changes but without evidence of ischemia..  Patient does not require inpatient hospitalization at this time and plans for her to follow-up with her GYN doctor and return precautions  given           Final Clinical Impression(s) / ED Diagnoses Final diagnoses:  None    Rx / DC Orders ED Discharge Orders     None         Lacretia Leigh, MD 09/20/21 2228

## 2021-09-20 NOTE — Discharge Instructions (Signed)
Continue to stay well-hydrated.  Follow-up with your gynecologist tomorrow

## 2021-09-20 NOTE — ED Notes (Signed)
Pt verbalizes understanding of discharge instructions. Opportunity for questioning and answers were provided. Pt discharged from ED to home. BG prior to discharge 89

## 2021-09-21 ENCOUNTER — Other Ambulatory Visit (HOSPITAL_COMMUNITY): Payer: Self-pay

## 2021-09-21 ENCOUNTER — Ambulatory Visit: Payer: 59 | Admitting: Physician Assistant

## 2021-09-21 DIAGNOSIS — H35411 Lattice degeneration of retina, right eye: Secondary | ICD-10-CM | POA: Diagnosis not present

## 2021-09-21 DIAGNOSIS — N939 Abnormal uterine and vaginal bleeding, unspecified: Secondary | ICD-10-CM | POA: Diagnosis not present

## 2021-09-21 DIAGNOSIS — H35412 Lattice degeneration of retina, left eye: Secondary | ICD-10-CM | POA: Diagnosis not present

## 2021-09-21 DIAGNOSIS — M069 Rheumatoid arthritis, unspecified: Secondary | ICD-10-CM | POA: Diagnosis not present

## 2021-09-21 DIAGNOSIS — Z79899 Other long term (current) drug therapy: Secondary | ICD-10-CM | POA: Diagnosis not present

## 2021-09-21 DIAGNOSIS — H40013 Open angle with borderline findings, low risk, bilateral: Secondary | ICD-10-CM | POA: Diagnosis not present

## 2021-09-21 MED ORDER — MEDROXYPROGESTERONE ACETATE 10 MG PO TABS
20.0000 mg | ORAL_TABLET | Freq: Three times a day (TID) | ORAL | 0 refills | Status: DC
  Filled 2021-09-21: qty 42, 7d supply, fill #0

## 2021-09-22 ENCOUNTER — Other Ambulatory Visit (HOSPITAL_COMMUNITY): Payer: Self-pay

## 2021-09-29 ENCOUNTER — Ambulatory Visit: Payer: 59 | Admitting: Internal Medicine

## 2021-10-02 ENCOUNTER — Other Ambulatory Visit: Payer: Self-pay

## 2021-10-02 DIAGNOSIS — N840 Polyp of corpus uteri: Secondary | ICD-10-CM | POA: Diagnosis not present

## 2021-10-02 DIAGNOSIS — N939 Abnormal uterine and vaginal bleeding, unspecified: Secondary | ICD-10-CM | POA: Diagnosis not present

## 2021-10-02 DIAGNOSIS — Z3202 Encounter for pregnancy test, result negative: Secondary | ICD-10-CM | POA: Diagnosis not present

## 2021-10-10 ENCOUNTER — Other Ambulatory Visit (INDEPENDENT_AMBULATORY_CARE_PROVIDER_SITE_OTHER): Payer: 59

## 2021-10-10 ENCOUNTER — Other Ambulatory Visit: Payer: Self-pay

## 2021-10-10 ENCOUNTER — Encounter: Payer: Self-pay | Admitting: Internal Medicine

## 2021-10-10 ENCOUNTER — Ambulatory Visit: Payer: 59 | Admitting: Internal Medicine

## 2021-10-10 VITALS — BP 120/78 | HR 100 | Ht 63.0 in | Wt 206.0 lb

## 2021-10-10 DIAGNOSIS — E282 Polycystic ovarian syndrome: Secondary | ICD-10-CM

## 2021-10-10 DIAGNOSIS — E890 Postprocedural hypothyroidism: Secondary | ICD-10-CM | POA: Diagnosis not present

## 2021-10-10 DIAGNOSIS — E559 Vitamin D deficiency, unspecified: Secondary | ICD-10-CM | POA: Diagnosis not present

## 2021-10-10 DIAGNOSIS — E538 Deficiency of other specified B group vitamins: Secondary | ICD-10-CM | POA: Diagnosis not present

## 2021-10-10 LAB — LIPID PANEL
Cholesterol: 162 mg/dL (ref 0–200)
HDL: 46.5 mg/dL (ref >39.00)
LDL Cholesterol: 93 mg/dL (ref 0–99)
NonHDL: 115.22
Total CHOL/HDL Ratio: 3
Triglycerides: 113 mg/dL (ref 0.0–149.0)
VLDL: 22.6 mg/dL (ref 0.0–40.0)

## 2021-10-10 LAB — T3, FREE: T3, Free: 3.6 pg/mL (ref 2.3–4.2)

## 2021-10-10 LAB — VITAMIN B12: Vitamin B-12: 853 pg/mL (ref 211–911)

## 2021-10-10 LAB — HEMOGLOBIN A1C: Hgb A1c MFr Bld: 5.2 % (ref 4.6–6.5)

## 2021-10-10 NOTE — Progress Notes (Signed)
Patient ID: Taylor Bennett, female   DOB: March 18, 1981, 41 y.o.   MRN: [id number]   This visit occurred during the SARS-CoV-2 public health emergency.  Safety protocols were in place, including screening questions prior to the visit, additional usage of staff PPE, and extensive cleaning of exam room while observing appropriate contact time as indicated for disinfecting solutions.   HPI  Taylor Bennett is a 42 y.o.-year-old female, initially referred by her PCP, Redmon, Noelle, PA, returning for follow-up for postsurgical hypothyroidism and hypoparathyroidism and vitamin D deficiency, low vitamin B12. She previously saw Dr. Dwyane Dee.  Last visit with me 1 year ago.  Interim history: She sees Dr.  Estanislado Pandy for seropositive RA.  She continues Plaquenil -started in 07/2020. She has occasional swelling and stiffness in joints. She will change to MTX.  Has a colonoscopy next month. She also had dysfunctional uterine bleeding. She will go to a surgery consult tomorrow. She feels dizzy, faint, continued bleeding. In 04/2021, per insurance preference, she had to change from Armour Thyroid to NP thyroid.  She has nausea and gagging with the NP thyroid.  Postsurgical hypoparathyroidism-mild intermittent: Reviewed calcium and PTH levels: Lab Results  Component Value Date   PTH 12 (L) 04/01/2020   PTH Comment 04/01/2020   PTH CANCELED 10/02/2019   PTH Comment 10/02/2019   PTH 9 (L) 05/08/2019   CALCIUM 9.1 09/20/2021   CALCIUM 8.8 05/01/2021   CALCIUM 8.9 12/08/2020   CALCIUM 9.6 07/27/2020   CALCIUM 9.2 06/06/2020   CALCIUM 9.1 04/01/2020   CALCIUM 8.9 10/02/2019   CALCIUM 9.5 05/08/2019   CALCIUM 9.3 09/26/2018   CALCIUM 8.8 (L) 07/19/2015  05/29/2019: Calcium 8.2 (8.7-10.2)  Magnesium and phosphorus levels were normal: Lab Results  Component Value Date   PHOS 3.2 05/08/2019   Lab Results  Component Value Date   MG 1.7 05/08/2019   Postsurgical hypothyroidism: Reviewed history: Pt.  has a h/o Hashimoto's thyroiditis and now has postsurgical hypothyroidism after her total thyroidectomy for neck compression symptoms and a suspicious nodule on 12/19/2015 (final pathology >> benign). Surgery was performed by Dr. Celine Ahr.   She could not tolerate Synthroid due to significant fatigue.  She was on Armour Thyroid 90 mg 5/7 days and 60 mg  2/7 days >> now on NP thyroid 90 mg daily: - in am - fasting - at least 30 min from b'fast - no calcium - no iron - stopped multivitamins - no PPIs - not on Biotin  Reviewed her TFTs: Lab Results  Component Value Date   TSH 2.10 03/20/2021   TSH 9.61 (H) 09/23/2020   TSH 9.560 (H) 09/23/2020   TSH 5.59 (H) 04/01/2020   TSH 2.320 10/02/2019   TSH 2.42 09/26/2018   TSH 2.49 07/25/2018   TSH 5.340 (H) 05/02/2018   TSH 2.770 10/10/2017   TSH 1.41 02/14/2017   FREET4 0.73 03/20/2021   FREET4 0.60 09/23/2020   FREET4 0.78 (L) 09/23/2020   FREET4 0.60 04/01/2020   FREET4 0.78 (L) 10/02/2019   FREET4 0.70 09/26/2018   FREET4 0.69 07/25/2018   FREET4 0.80 (L) 05/02/2018   FREET4 0.89 10/10/2017   FREET4 0.74 02/14/2017  05/29/2019: TSH 2.48 05/06/2019: TSH 2.28 01/2016: TSH 2.6  Pt denies: - feeling nodules in neck - hoarseness - choking - SOB with lying down  She has + FH of thyroid disorders in: mother. No FH of thyroid cancer. No h/o radiation tx to head or neck.  No herbal supplements. No Biotin use. No  recent steroids use.   PCOS: -She was on metformin- but stopped 2/2 diarrhea in 07/2017 >> she restarted this in 09/2017 but stopped as she did not like how it made her feel -She was on NuvaRing (per Dr. Landry Mellow) but now off for more than 3 years -Restarted Ovasitol 09/2017 but did not continue it -Menses are regular -No hirsutism or acne, but continues to have hair loss  Vitamin D deficiency:  Vitamin D levels were reviewed: Lab Results  Component Value Date   VD25OH 51.3 09/23/2020   VD25OH 83.9 04/01/2020    VD25OH 87.3 10/02/2019   VD25OH 40.72 05/08/2019   VD25OH 32.22 09/26/2018   VD25OH 24.33 (L) 07/25/2018   VD25OH 50.7 09/26/2017   VD25OH 49.42 02/14/2017   VD25OH 24.47 (L) 10/23/2016   VD25OH 12.07 (L) 08/09/2016   She continues on 5000 units x 2 vitamin D3 daily.  Low vitamin B12:  Reviewed vitamin B12 levels: Lab Results  Component Value Date   VITAMINB12 443 09/23/2020   VITAMINB12 628 09/23/2020   VITAMINB12 503 04/01/2020   VITAMINB12 863 10/02/2019   VITAMINB12 310 09/26/2018   VITAMINB12 550 09/26/2017   She was on a multivitamin with B12 >> now on 1000 mcg B12.  She also had a severe vertigo episode in summer 2018 >> on Meclizine.  She also has DDD in cervical spine. Sees Dr. Louanne Skye.  She has a family history of RA.  ROS: + See HPI  Musculoskeletal: no muscle aches/+ joint aches and swelling Skin: no rashes, + hair loss  I reviewed pt's medications, allergies, PMH, social hx, family hx, and changes were documented in the history of present illness. Otherwise, unchanged from my initial visit note.  Past Medical History:  Diagnosis Date   Back pain    Cervical pain    Cervical spondylolysis    DDD (degenerative disc disease), lumbar    PCOS (polycystic ovarian syndrome)    Rheumatoid arthritis ([place])    Scoliosis    Thyroid disease    Past Surgical History:  Procedure Laterality Date   ANKLE SURGERY  2009   ORIF    THYROIDECTOMY     WISDOM TOOTH EXTRACTION     Social History   Social History   Marital status: Single    Spouse name: N/A   Number of children: N/A   Occupational History   Not on file.   Social History Main Topics   Smoking status: Never Smoker   Smokeless tobacco: Never Used   Alcohol use No   Drug use: No   Current Outpatient Medications on File Prior to Visit  Medication Sig Dispense Refill   Ascorbic Acid (VITAMIN C PO) Take by mouth daily.     benzonatate (TESSALON) 100 MG capsule Take 1 capsule (100 mg total) by mouth  3 (three) times daily as needed for cough. (Patient not taking: Reported on 05/01/2021) 21 capsule 0   brompheniramine-pseudoephedrine-DM 30-2-10 MG/5ML syrup Take 10 mLs by mouth 4 (four) times daily as needed. (Patient not taking: Reported on 05/01/2021) 120 mL 0   Cholecalciferol (VITAMIN D PO) Take 5,000 Units by mouth 2 (two) times daily.      COVID-19 At Home Antigen Test [facility] COVID-19 HOME TEST) KIT Use as directed 4 each 0   COVID-19 At Home Antigen Test (QUICKVUE AT-HOME COVID-19 TEST) KIT Use as directed (Patient not taking: Reported on 08/10/2021) 2 each 0   dicyclomine (BENTYL) 10 MG capsule Take 1 capsule (10 mg total) by mouth  3 (three) times daily as needed for abdominal pain 90 capsule 3   etonogestrel-ethinyl estradiol (NUVARING) 0.12-0.015 MG/24HR vaginal ring Insert 1 ring vaginally; leave in place for 3 weeks, remove, and replace with a new ring.  Take 7 day break after every third ring Vaginal as directed 21 day(s) 3 each 4   hydroxychloroquine (PLAQUENIL) 200 MG tablet Take 1 tablet (200 mg total) by mouth 2 (two) times daily from Monday to Friday. 120 tablet 0   ibuprofen (ADVIL,MOTRIN) 200 MG tablet Takes 671m - 8022mdaily, as needed.     loratadine (CLARITIN) 10 MG tablet Take 10 mg by mouth daily.     meclizine (ANTIVERT) 25 MG tablet Take 1 tablet (25 mg total) by mouth 3 (three) times daily as needed for dizziness. 20 tablet 0   medroxyPROGESTERone (PROVERA) 10 MG tablet Take 2 tablets (20 mg total) by mouth 3 (three) times daily for 7 days 42 tablet 0   Metaxalone (SKELAXIN PO) Take by mouth as needed. (Patient not taking: Reported on 05/01/2021)     metaxalone (SKELAXIN) 800 MG tablet TAKE 1/2 TO 1 TABLET BY MOUTH UP TO 3 TIMES DAILY. 60 tablet 0   Multiple Vitamin (MULTIVITAMIN PO) Take by mouth daily.     norgestimate-ethinyl estradiol (ORTHO-CYCLEN) 0.25-35 MG-MCG tablet TAKE 1 TABLET BY MOUTH ONCE A DAY (Patient not taking: Reported on 05/01/2021) 84 tablet 3   NP  THYROID 90 MG tablet Take 1 tablet (90 mg total) by mouth daily. 90 tablet 3   rizatriptan (MAXALT) 10 MG tablet Take 10 mg by mouth as needed for migraine. May repeat in 2 hours if needed (Patient not taking: Reported on 05/01/2021)     rizatriptan (MAXALT) 10 MG tablet Take 1 tablet (10 mg total) by mouth at the onset of headache. May repeat in 2 hours as needed (max: 2 tabs in 24 hours) 6 tablet 4   rizatriptan (MAXALT) 10 MG tablet take 1 tablet by mouth at the onset of headache may repeat in 2 hours if needed (max 2 tabs in 24 hours) 6 tablet 3   ZINC CITRATE-PHYTASE PO Take by mouth.     [DISCONTINUED] gabapentin (NEURONTIN) 100 MG capsule Take 1 capsule (100 mg total) by mouth at bedtime. (Patient not taking: Reported on 10/27/2020) 30 capsule 3   No current facility-administered medications on file prior to visit.   No Known Allergies Family History  Problem Relation Age of Onset   Thyroid disease Mother    Arthritis Mother    Diabetes Father    Congestive Heart Failure Father    Alcoholism Father    Neuropathy Father    Arthritis Father    Osteoarthritis Maternal Aunt    Rheum arthritis Paternal Grandmother    Healthy Daughter    Polycystic ovary syndrome Daughter    PE: BP 120/78 (BP Location: Left Arm, Patient Position: Sitting, Cuff Size: Normal)    Pulse 100    Ht '5\' 3"'  (1.6 m)    Wt 206 lb (93.4 kg)    SpO2 99%    BMI 36.49 kg/m  Wt Readings from Last 3 Encounters:  10/10/21 206 lb (93.4 kg)  09/20/21 205 lb (93 kg)  08/10/21 209 lb (94.8 kg)   Constitutional: overweight, in NAD Eyes: PERRLA, EOMI, no exophthalmos ENT: moist mucous membranes, no thyromegaly, no cervical lymphadenopathy Cardiovascular: Tachycardia, RR, No MRG Respiratory: CTA B Musculoskeletal: no deformities, strength intact in all 4 Skin: moist, warm, no rashes Neurological: no tremor  with outstretched hands, DTR normal in all 4  ASSESSMENT: 1. Postsurgical Hypothyroidism  2. Vitamin D  deficiency  3.  Low vitamin B12  4.  PCOS  5.  Hypocalcemia due to postsurgical hypoparathyroidism  PLAN:  1. Patient with longstanding hypothyroidism, after total thyroidectomy.  She could not tolerate levothyroxine-we switched to Armour Thyroid, on which she did very well, however, in 04/2021, her insurance stopped covering Armour so we started NP thyroid.  Unfortunately, she has nausea and gagging every time he is taking the tablet in the morning. - latest thyroid labs reviewed with pt. >> normal: Lab Results  Component Value Date   TSH 2.10 03/20/2021  - she continues on NP thyroid 90 mg daily, but will try to switch back to Armour initially at the same dose and we can always change to an alternating dose if needed - pt feels good on this dose, however, she does have fatigue possibly related to her increased blood loss - we discussed about taking the thyroid hormone every day, with water, >30 minutes before breakfast, separated by >4 hours from acid reflux medications, calcium, iron, multivitamins. Pt. is taking it correctly. - will check thyroid tests today: TSH, free T3 and fT4 - If labs are abnormal, she will need to return for repeat TFTs in 1.5 months  2. Vitamin D deficiency -She continues on 10,000 units vitamin D daily -At last visit, vitamin D was normal, at 51.3. -We will recheck a vitamin D level today  3. Low vitamin B12 -Diagnosed during investigation for fatigue -She was on a multivitamin before, now on B12 1000 mcg daily -At last visit, B12 level was 628, at goal -We will recheck her B12 level today   4.  PCOS -She has regular menstrual cycles -Previously on of Ovasitol and metformin, now off -Reviewed latest HbA1c: Normal: Lab Results  Component Value Date   HGBA1C 5.2 09/23/2020   HGBA1C CANCELED 09/23/2020  -We will repeat this today -She also had mild hyperlipidemia at last check: Lab Results  Component Value Date   CHOL 166 09/23/2020   CHOL 178  09/23/2020   HDL 52.50 09/23/2020   HDL 52 09/23/2020   LDLCALC 92 09/23/2020   LDLCALC 106 (H) 09/23/2020   TRIG 108.0 09/23/2020   TRIG 109 09/23/2020   CHOLHDL 3 09/23/2020   CHOLHDL 3.4 09/23/2020  -We will repeat her lipid panel today  5.  Hypocalcemia -Possibly developed after her thyroid surgery but also possibly due to vitamin D deficiency -Latest calcium level was normal on 09/20/2021: 9.1 -Our target for her is the low normal range -She denies perioral numbness and chronic cramping but she has joint pains, possibly related to her RA  Component     Latest Ref Rng & Units 10/10/2021          Cholesterol     0 - 200 mg/dL 162  Triglycerides     0.0 - 149.0 mg/dL 113.0  HDL Cholesterol     >39.00 mg/dL 46.50  VLDL     0.0 - 40.0 mg/dL 22.6  LDL (calc)     0 - 99 mg/dL 93  Total CHOL/HDL Ratio      3  NonHDL      115.22  VITD     30.00 - 100.00 ng/mL 65.14  TSH     0.35 - 5.50 uIU/mL 0.66  T4,Free(Direct)     0.60 - 1.60 ng/dL 0.80  Triiodothyronine,Free,Serum     2.3 - 4.2 pg/mL  3.6  Vitamin B12     211 - 911 pg/mL 853  Hemoglobin A1C     4.6 - 6.5 % 5.2   All labs are at goal. Will go ahead and submit the preop.  We will go ahead and send a new prescription for Armour thyroid, however, she may need a preauthorization.    Philemon Kingdom, MD PhD [facility] Endocrinology

## 2021-10-10 NOTE — Patient Instructions (Signed)
Please stop at the lab.  Let's try again to change to Armour 90 mg daily.  Take the thyroid hormone every day, with water, at least 30 minutes before breakfast, separated by at least 4 hours from: - acid reflux medications - calcium - iron - multivitamins  Please come back for a follow-up appointment in 1 year.

## 2021-10-11 ENCOUNTER — Other Ambulatory Visit (HOSPITAL_COMMUNITY): Payer: Self-pay

## 2021-10-11 DIAGNOSIS — N921 Excessive and frequent menstruation with irregular cycle: Secondary | ICD-10-CM | POA: Diagnosis not present

## 2021-10-11 DIAGNOSIS — D259 Leiomyoma of uterus, unspecified: Secondary | ICD-10-CM | POA: Diagnosis not present

## 2021-10-11 LAB — VITAMIN D 25 HYDROXY (VIT D DEFICIENCY, FRACTURES): VITD: 65.14 ng/mL (ref 30.00–100.00)

## 2021-10-11 LAB — TSH: TSH: 0.66 u[IU]/mL (ref 0.35–5.50)

## 2021-10-11 LAB — T4, FREE: Free T4: 0.8 ng/dL (ref 0.60–1.60)

## 2021-10-11 MED ORDER — ARMOUR THYROID 90 MG PO TABS
90.0000 mg | ORAL_TABLET | Freq: Every day | ORAL | 3 refills | Status: DC
  Filled 2021-10-11: qty 90, 90d supply, fill #0
  Filled 2021-11-07: qty 30, 30d supply, fill #0
  Filled 2021-11-07: qty 90, 90d supply, fill #0

## 2021-10-13 ENCOUNTER — Other Ambulatory Visit (HOSPITAL_COMMUNITY): Payer: Self-pay

## 2021-10-19 ENCOUNTER — Other Ambulatory Visit (HOSPITAL_COMMUNITY): Payer: Self-pay

## 2021-11-07 ENCOUNTER — Other Ambulatory Visit: Payer: Self-pay

## 2021-11-07 ENCOUNTER — Other Ambulatory Visit (HOSPITAL_COMMUNITY): Payer: Self-pay

## 2021-11-07 DIAGNOSIS — E890 Postprocedural hypothyroidism: Secondary | ICD-10-CM

## 2021-11-07 MED ORDER — THYROID 90 MG PO TABS
90.0000 mg | ORAL_TABLET | Freq: Every day | ORAL | 3 refills | Status: DC
  Filled 2021-11-07: qty 90, 90d supply, fill #0
  Filled 2022-02-02: qty 90, 90d supply, fill #1
  Filled 2022-04-30: qty 90, 90d supply, fill #2
  Filled 2022-07-31: qty 90, 90d supply, fill #3

## 2021-11-07 NOTE — Telephone Encounter (Signed)
Pt requested refill of NP Thyroid due to co pay for Armour Thyroid.

## 2021-11-15 ENCOUNTER — Other Ambulatory Visit (HOSPITAL_COMMUNITY): Payer: Self-pay

## 2021-11-15 DIAGNOSIS — Z0000 Encounter for general adult medical examination without abnormal findings: Secondary | ICD-10-CM | POA: Diagnosis not present

## 2021-11-15 DIAGNOSIS — M5136 Other intervertebral disc degeneration, lumbar region: Secondary | ICD-10-CM | POA: Diagnosis not present

## 2021-11-15 DIAGNOSIS — M5030 Other cervical disc degeneration, unspecified cervical region: Secondary | ICD-10-CM | POA: Diagnosis not present

## 2021-11-15 DIAGNOSIS — G43909 Migraine, unspecified, not intractable, without status migrainosus: Secondary | ICD-10-CM | POA: Diagnosis not present

## 2021-11-15 DIAGNOSIS — R079 Chest pain, unspecified: Secondary | ICD-10-CM | POA: Diagnosis not present

## 2021-11-15 DIAGNOSIS — Z13220 Encounter for screening for lipoid disorders: Secondary | ICD-10-CM | POA: Diagnosis not present

## 2021-11-15 DIAGNOSIS — R42 Dizziness and giddiness: Secondary | ICD-10-CM | POA: Diagnosis not present

## 2021-11-15 MED ORDER — RIZATRIPTAN BENZOATE 10 MG PO TABS
10.0000 mg | ORAL_TABLET | ORAL | 3 refills | Status: DC
  Filled 2021-11-15: qty 6, 30d supply, fill #0
  Filled 2022-03-12: qty 6, 30d supply, fill #1
  Filled 2022-04-08: qty 6, 30d supply, fill #2
  Filled 2022-06-27: qty 6, 30d supply, fill #3

## 2021-11-20 ENCOUNTER — Other Ambulatory Visit (HOSPITAL_COMMUNITY): Payer: Self-pay

## 2021-12-06 ENCOUNTER — Other Ambulatory Visit (HOSPITAL_COMMUNITY): Payer: Self-pay

## 2021-12-07 ENCOUNTER — Other Ambulatory Visit (HOSPITAL_COMMUNITY): Payer: Self-pay

## 2021-12-07 MED ORDER — ETONOGESTREL-ETHINYL ESTRADIOL 0.12-0.015 MG/24HR VA RING
VAGINAL_RING | VAGINAL | 1 refills | Status: DC
  Filled 2021-12-07: qty 3, 84d supply, fill #0

## 2021-12-11 ENCOUNTER — Ambulatory Visit (INDEPENDENT_AMBULATORY_CARE_PROVIDER_SITE_OTHER): Payer: 59

## 2021-12-11 ENCOUNTER — Encounter: Payer: Self-pay | Admitting: Specialist

## 2021-12-11 ENCOUNTER — Ambulatory Visit: Payer: 59 | Admitting: Specialist

## 2021-12-11 ENCOUNTER — Other Ambulatory Visit (HOSPITAL_COMMUNITY): Payer: Self-pay

## 2021-12-11 VITALS — BP 115/79 | HR 81 | Ht 63.0 in | Wt 206.0 lb

## 2021-12-11 DIAGNOSIS — M542 Cervicalgia: Secondary | ICD-10-CM

## 2021-12-11 DIAGNOSIS — M0579 Rheumatoid arthritis with rheumatoid factor of multiple sites without organ or systems involvement: Secondary | ICD-10-CM

## 2021-12-11 DIAGNOSIS — M545 Low back pain, unspecified: Secondary | ICD-10-CM

## 2021-12-11 DIAGNOSIS — R202 Paresthesia of skin: Secondary | ICD-10-CM

## 2021-12-11 DIAGNOSIS — G5622 Lesion of ulnar nerve, left upper limb: Secondary | ICD-10-CM | POA: Diagnosis not present

## 2021-12-11 DIAGNOSIS — R200 Anesthesia of skin: Secondary | ICD-10-CM

## 2021-12-11 MED ORDER — DICLOFENAC SODIUM 1 % EX GEL
2.0000 g | Freq: Four times a day (QID) | CUTANEOUS | 2 refills | Status: AC
  Filled 2021-12-11: qty 300, 37d supply, fill #0

## 2021-12-11 NOTE — Progress Notes (Signed)
? ?Office Visit Note ?  ?Patient: Taylor Bennett           ?Date of Birth: Jan 18, 1981           ?MRN: [id number] ?Visit Date: 12/11/2021 ?             ?Requested by: Lennie Odor, PA ?[address] ?[address] ?[place],  [place] [address] ?PCP: Lennie Odor, PA ? ? ?Assessment & Plan: ?Visit Diagnoses:  ?1. Low back pain, unspecified back pain laterality, unspecified chronicity, unspecified whether sciatica present   ?2. Cervicalgia   ?3. Cubital tunnel syndrome on left   ?4. Numbness and tingling of both upper extremities   ?5. Rheumatoid arthritis with rheumatoid factor of multiple sites without organ or systems involvement ([place])   ? ? ?Plan: Avoid overhead lifting and overhead use of the arms. ?Do not lift greater than 5 lbs. ?Adjust head rest in vehicle to prevent hyperextension if rear ended. ?Take extra precautions to avoid falling. ?Avoid frequent bending and stooping  ?No lifting greater than 10 lbs. ?May use ice or moist heat for pain. ?Weight loss is of benefit. ?Best medication for lumbar disc disease is arthritis medications like motrin, celebrex and naprosyn. ?Exercise is important to improve your indurance and does allow people to function better inspite of back pain. ? Use diclofenc gel 3-4 times per day for hand pain.  ?Referral to neurology Dr. Krista Blue for reassessment of UE and LE numbness and tingling ?MRI of the cervical spine to assess for C1-2 pannus or cervical stenosi ? ?Follow-Up Instructions: Return in about 4 weeks (around 01/08/2022).  ? ?Orders:  ?Orders Placed This Encounter  ?Procedures  ? XR Cervical Spine 2 or 3 views  ? XR Lumbar Spine 2-3 Views  ? MR Cervical Spine w/o contrast  ? Ambulatory referral to Neurology  ? ?No orders of the defined types were placed in this encounter. ? ? ? ? Procedures: ?No procedures performed ? ? ?Clinical Data: ?No additional findings. ? ? ?Subjective: ?Chief Complaint  ?Patient presents with  ? Neck - Pain  ? Lower Back - Pain  ? ? ?41 year old female  with history of neck and low back pain and stiffness. There is numbness and tingling in both arms and legs, more at night and there is pain at night. At night the LE more than UE and there is more pain into the arm from elbow upwards. Wrists are painful. She has shooting pain in the bottom of her feet when getting up at night. It is hard to bear weight on the feet due to pain in the bottom of the feet. Pain is debilitating at times.  ? ? ?Review of Systems  ?Constitutional: Negative.   ?HENT: Negative.    ?Eyes: Negative.   ?Respiratory: Negative.    ?Cardiovascular: Negative.   ?Gastrointestinal: Negative.   ?Endocrine: Negative.   ?Genitourinary: Negative.   ?Musculoskeletal: Negative.   ?Skin: Negative.   ?Allergic/Immunologic: Negative.   ?Neurological: Negative.   ?Hematological: Negative.   ?Psychiatric/Behavioral: Negative.    ? ? ?Objective: ?Vital Signs: BP 115/79 (BP Location: Left Arm, Patient Position: Sitting)   Pulse 81   Ht '5\' 3"'$  (1.6 m)   Wt 206 lb (93.4 kg)   BMI 36.49 kg/m?  ? ?Physical Exam ?Constitutional:   ?   Appearance: She is well-developed.  ?HENT:  ?   Head: Normocephalic and atraumatic.  ?Eyes:  ?   Pupils: Pupils are equal, round, and reactive to  light.  ?Pulmonary:  ?   Effort: Pulmonary effort is normal.  ?   Breath sounds: Normal breath sounds.  ?Abdominal:  ?   General: Bowel sounds are normal.  ?   Palpations: Abdomen is soft.  ?Musculoskeletal:  ?   Cervical back: Normal range of motion and neck supple.  ?   Lumbar back: Negative right straight leg raise test and negative left straight leg raise test.  ?Skin: ?   General: Skin is warm and dry.  ?Neurological:  ?   Mental Status: She is alert and oriented to person, place, and time.  ?Psychiatric:     ?   Behavior: Behavior normal.     ?   Thought Content: Thought content normal.     ?   Judgment: Judgment normal.  ? ? ?Back Exam  ? ?Tenderness  ?The patient is experiencing tenderness in the cervical. ? ?Range of Motion   ?Extension:  abnormal  ?Flexion:  70 abnormal  ?Lateral bend right:  abnormal  ?Lateral bend left:  abnormal  ?Rotation right:  abnormal  ?Rotation left:  abnormal  ? ?Tests  ?Straight leg raise right: negative ?Straight leg raise left: negative ? ?Other  ?Toe walk: normal ?Heel walk: normal ?Erythema: no back redness ?Scars: absent ? ? ?Right Elbow Exam  ? ?Tenderness  ?The patient is experiencing tenderness in the medial epicondyle.  ? ?Muscle Strength  ?Pronation:  5/5  ?Supination:  5/5  ? ?Tests  ?Tinel's sign (cubital tunnel): positive ? ?Comments:  Positive tinel's right elbow ?Negative tinels at the wrists ?Phalens with right ulnar n symptoms.  ? ? ? ?Specialty Comments:  ?No specialty comments available. ? ?Imaging: ?XR Cervical Spine 2 or 3 views ? ?Result Date: 12/11/2021 ?Ap and lateral flexion and extension radiographs show auto fusion vertebral body of C6 and C7, DDD C5-6 with Minimal anterolisthesis C4-5 probably compensatory. Torg ratio is normal. No C1-2 listhesis or widening of the C1-2 space with flexion and extension. Left rotation of the cervical spine with right scoliosis of the cervicothoracic Spine. ? ?XR Lumbar Spine 2-3 Views ? ?Result Date: 12/11/2021 ?AP and lateral flexion and extension radiographs of the lumbar spine show no anteroliisthesis, mild disc narrowing L3-4 and L4-5 and L5-S1 with minimal anterior spurs, no acute findings. Right lumbar scoliosis  15 degrees L1 to L5.   ? ? ?PMFS History: ?Patient Active Problem List  ? Diagnosis Date Noted  ? Paresthesia 11/30/2016  ? Neck pain 10/23/2016  ? Left cervical radiculopathy 10/23/2016  ? Vitamin D deficiency 08/10/2016  ? PCOS (polycystic ovarian syndrome) 06/16/2013  ? Migraines 06/16/2013  ? Hypothyroidism 06/10/2013  ? ?Past Medical History:  ?Diagnosis Date  ? Back pain   ? Cervical pain   ? Cervical spondylolysis   ? DDD (degenerative disc disease), lumbar   ? PCOS (polycystic ovarian syndrome)   ? Rheumatoid arthritis ([place])   ?  Scoliosis   ? Thyroid disease   ?  ?Family History  ?Problem Relation Age of Onset  ? Thyroid disease Mother   ? Arthritis Mother   ? Diabetes Father   ? Congestive Heart Failure Father   ? Alcoholism Father   ? Neuropathy Father   ? Arthritis Father   ? Osteoarthritis Maternal Aunt   ? Rheum arthritis Paternal Grandmother   ? Healthy Daughter   ? Polycystic ovary syndrome Daughter   ?  ?Past Surgical History:  ?Procedure Laterality Date  ? ANKLE SURGERY  2009  ? ORIF   ?  THYROIDECTOMY    ? WISDOM TOOTH EXTRACTION    ? ?Social History  ? ?Occupational History  ? Occupation: ED - Admissions  ?Tobacco Use  ? Smoking status: Former  ?  Years: 6.00  ?  Types: Cigarettes  ?  Quit date: 2012  ?  Years since quitting: 11.2  ? Smokeless tobacco: Never  ? Tobacco comments:  ?  Social smoker  ?Vaping Use  ? Vaping Use: Never used  ?Substance and Sexual Activity  ? Alcohol use: Yes  ?  Comment: occ  ? Drug use: No  ? Sexual activity: Not on file  ? ? ? ? ? ? ?

## 2021-12-11 NOTE — Patient Instructions (Addendum)
Avoid overhead lifting and overhead use of the arms. ?Do not lift greater than 5 lbs. ?Adjust head rest in vehicle to prevent hyperextension if rear ended. ?Take extra precautions to avoid falling. ?Avoid frequent bending and stooping  ?No lifting greater than 10 lbs. ?May use ice or moist heat for pain. ?Weight loss is of benefit. ?Best medication for lumbar disc disease is arthritis medications like motrin, celebrex and naprosyn. ?Exercise is important to improve your indurance and does allow people to function better inspite of back pain. ? Use diclofenc gel 3-4 times per day for hand pain.  ?Referral to neurology Dr. Krista Blue for reassessment of UE and LE numbness and tingling ?MRI of the cervical spine to assess for C1-2 pannus or cervical stenosis.  ?

## 2021-12-13 ENCOUNTER — Ambulatory Visit: Payer: 59 | Admitting: Orthopaedic Surgery

## 2021-12-14 ENCOUNTER — Other Ambulatory Visit (HOSPITAL_COMMUNITY): Payer: Self-pay

## 2021-12-18 ENCOUNTER — Telehealth: Payer: Self-pay | Admitting: Specialist

## 2021-12-18 NOTE — Telephone Encounter (Signed)
LMOM for pt to return call to schedule MRI Csp review/follow up with Dr. Louanne Skye around 01/08/22, per Dr. Birdena Jubilee note ?

## 2021-12-19 ENCOUNTER — Ambulatory Visit
Admission: RE | Admit: 2021-12-19 | Discharge: 2021-12-19 | Disposition: A | Discharge status: Home / Self Care | Payer: 59 | Source: Ambulatory Visit | Attending: Specialist | Admitting: Specialist

## 2021-12-19 DIAGNOSIS — M47812 Spondylosis without myelopathy or radiculopathy, cervical region: Secondary | ICD-10-CM | POA: Diagnosis not present

## 2021-12-19 DIAGNOSIS — M50222 Other cervical disc displacement at C5-C6 level: Secondary | ICD-10-CM | POA: Diagnosis not present

## 2021-12-19 DIAGNOSIS — M542 Cervicalgia: Secondary | ICD-10-CM

## 2021-12-19 DIAGNOSIS — M4802 Spinal stenosis, cervical region: Secondary | ICD-10-CM | POA: Diagnosis not present

## 2021-12-19 IMAGING — MR MR CERVICAL SPINE W/O CM
4 of 5 series · 29 of 48 positions shown · non-contrast
Comparison: Prior radiograph from [DATE] and MRI from
[DATE].

CLINICAL DATA: Initial evaluation for chronic neck pain, ataxia,
intermittent upper and lower extremity numbness and tingling.
History of rheumatoid arthritis.

EXAM:
MRI CERVICAL SPINE WITHOUT CONTRAST
TECHNIQUE: Multiplanar, multisequence MR imaging of the cervical spine was
performed. No intravenous contrast was administered.

[Series 2: T2 · sagittal · 3.0mm · 0.66mm/px · 8 of 15 slices shown (1 of 2)]
[im 1/15]
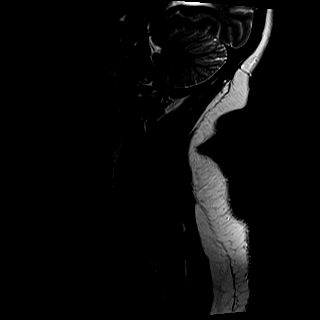
[im 3/15]
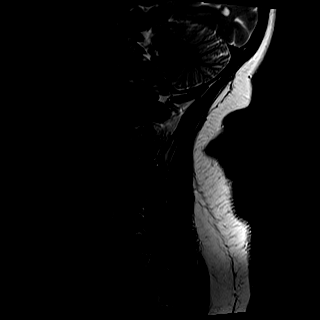
[im 5/15]
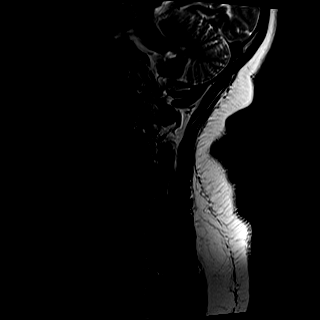
[im 7/15]
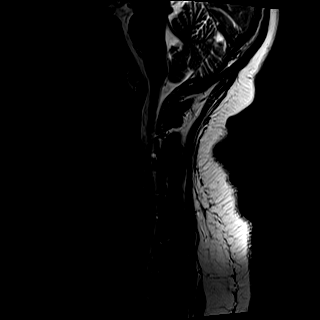
[im 9/15]
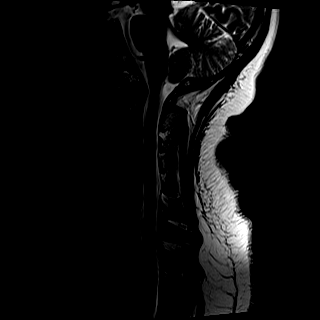
[im 11/15]
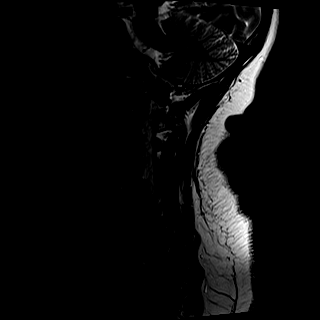
[im 13/15]
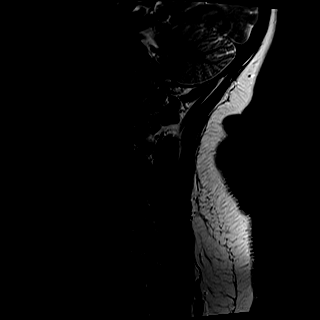
[im 15/15]
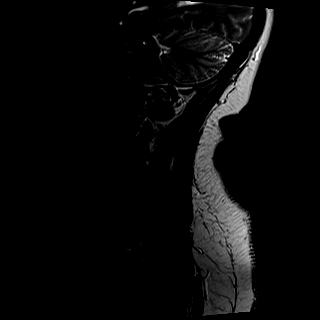

[Series 3: T1 · sagittal · 3.0mm · 0.41mm/px · 7 of 15 slices shown]
[im 1/15]
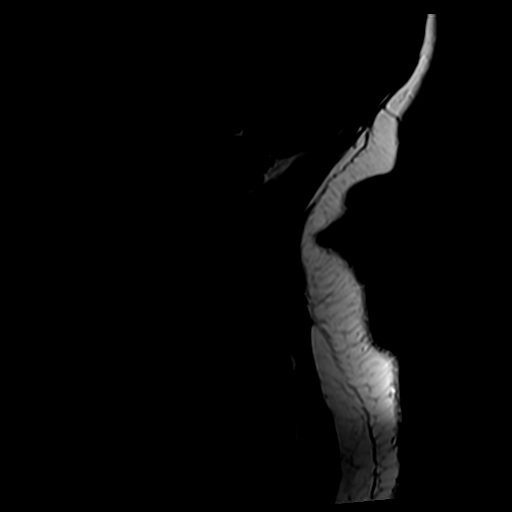
[im 3/15]
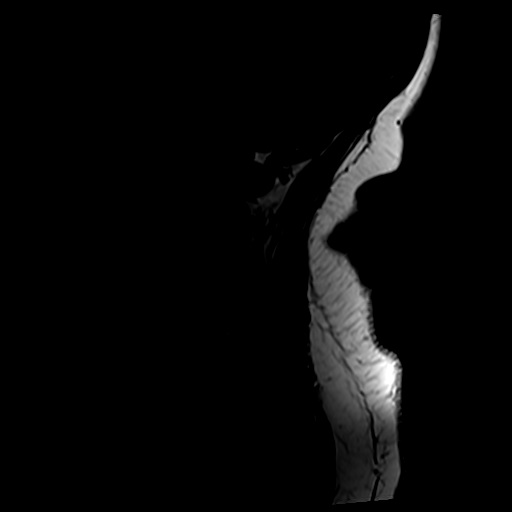
[im 5/15]
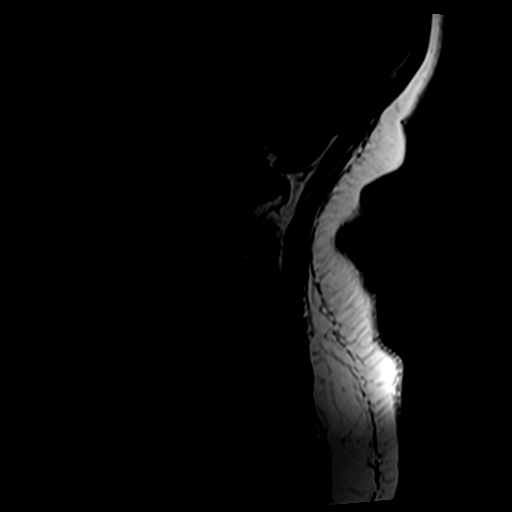
[im 8/15]
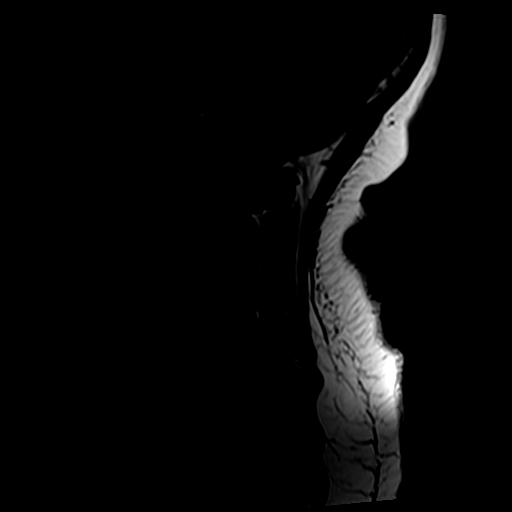
[im 10/15]
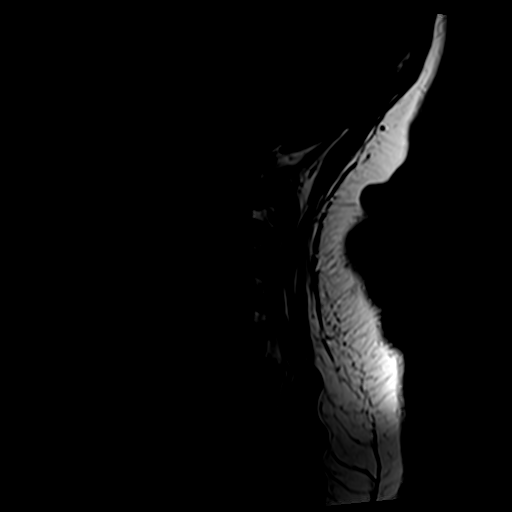
[im 12/15]
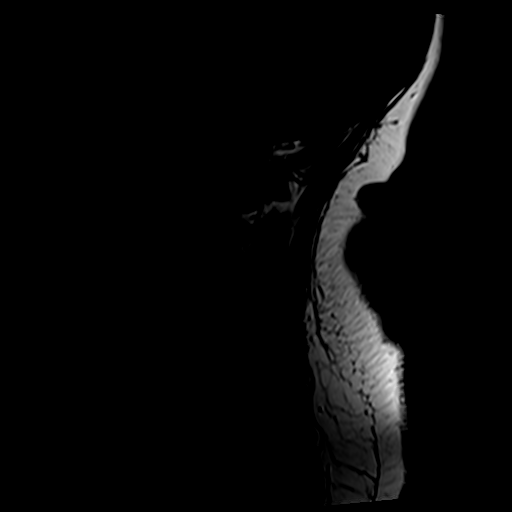
[im 15/15]
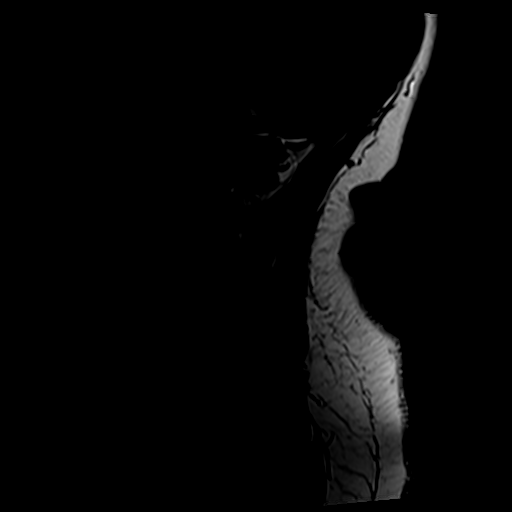

[Series 4: tir sag · sagittal · 3.0mm · 0.41mm/px · 5 of 15 slices shown]
[im 1/15]
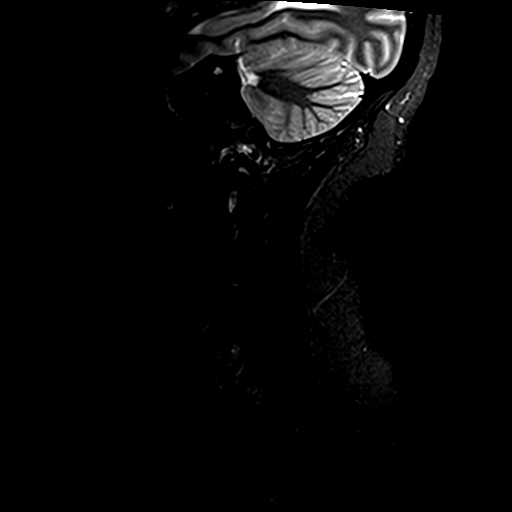
[im 3/15]
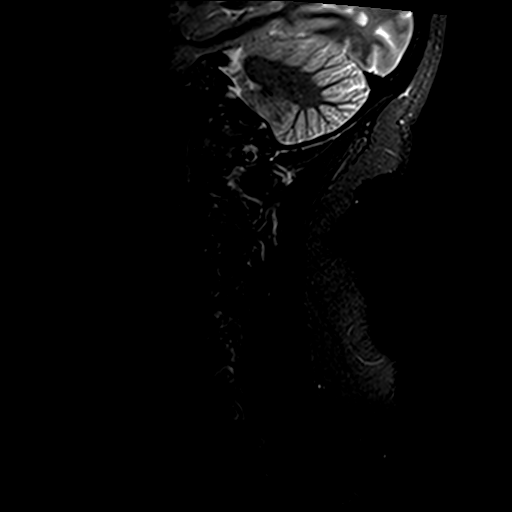
[im 5/15]
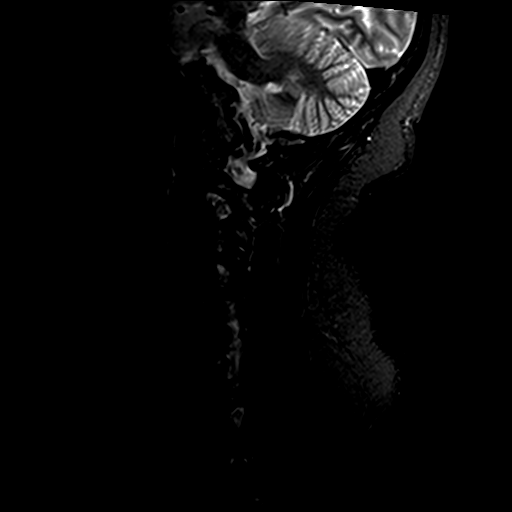
[im 8/15]
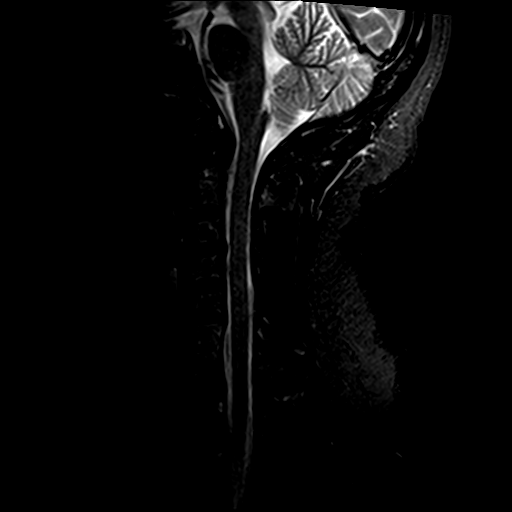
[im 12/15]
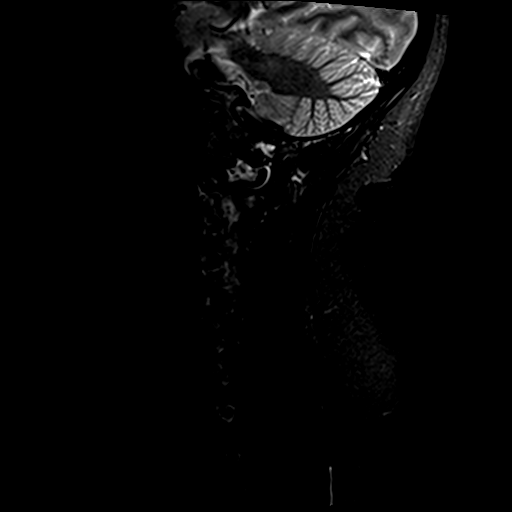

[Series 6: T2 · axial · 3.0mm · 0.70mm/px · z∈[-48,+52]mm · 9 of 28 slices shown (2 of 2)]
[im 1/28]
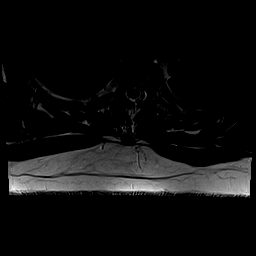
[im 5/28]
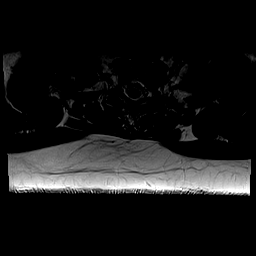
[im 10/28]
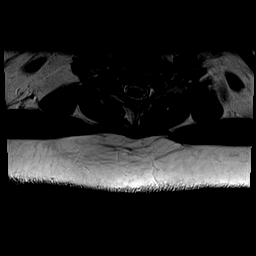
[im 12/28]
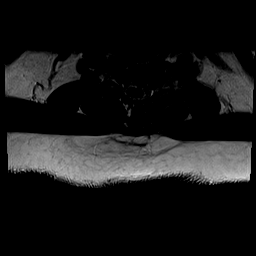
[im 14/28]
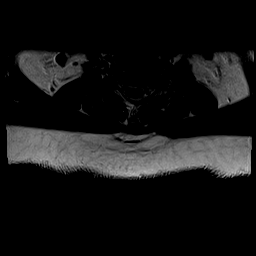
[im 16/28]
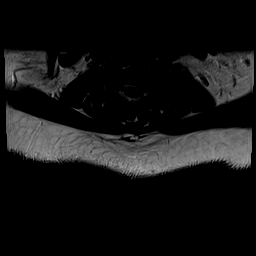
[im 19/28]
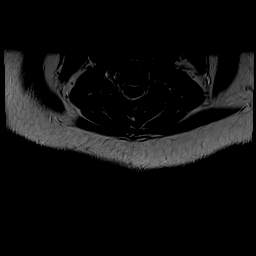
[im 23/28]
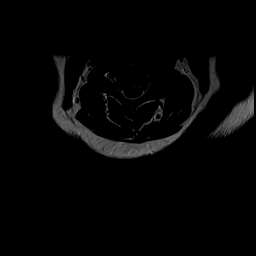
[im 28/28]
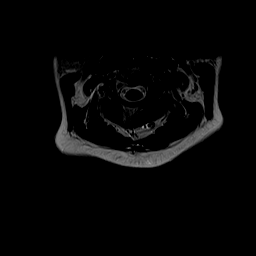

[29 of 48 positions shown; findings below may reference images not displayed]

FINDINGS: Alignment: Straightening of the normal cervical lordosis. No
listhesis.

Vertebrae: Congenital fusion of the C6 and C7 vertebral bodies.
Vertebral body height maintained without acute or chronic fracture.
Bone marrow signal intensity within normal limits. No discrete or
worrisome osseous lesions or abnormal marrow edema.

Cord: Normal signal morphology.

Posterior Fossa, vertebral arteries, paraspinal tissues: Visualized
brain and posterior fossa within normal limits. Craniocervical
junction normal. Paraspinous and prevertebral soft tissues within
normal limits. Normal intravascular flow voids seen within the
vertebral arteries bilaterally.

Disc levels:

C2-C3: Unremarkable.

C3-C4: Mild disc bulge with endplate and uncovertebral spurring.
Mild flattening of the ventral thecal sac without significant spinal
stenosis. Foramina remain patent.

C4-C5: Minimal disc bulge. No spinal stenosis. Foramina remain
patent.

C5-C6: Mild intervertebral disc space narrowing with diffuse disc
bulge. Associated endplate and uncovertebral spurring, greater on
the right. No spinal stenosis. Moderate right C6 foraminal
narrowing. Left neural foramen remains patent.

C6-C7:  Congenital fusion.  No canal or foraminal stenosis.

C7-T1: Negative interspace. Right-sided facet hypertrophy. No spinal
stenosis. Foramina remain patent.

Visualized upper thoracic spine demonstrates no significant finding.
IMPRESSION: 1. Right eccentric disc bulge with uncovertebral spurring at C5-6
with resultant moderate right C6 foraminal stenosis.
2. Mild noncompressive disc bulging at C3-4 and C4-5 without
stenosis.
3. Congenital fusion of the C6 and C7 vertebral bodies.

## 2021-12-21 NOTE — Progress Notes (Signed)
MRI on the neck shows no spinal cord compression, disc wear at several levels, mild narrowing of the openings for nerves at one level may be a source of nerve irritation.

## 2021-12-28 ENCOUNTER — Other Ambulatory Visit: Payer: Self-pay | Admitting: Physician Assistant

## 2021-12-28 ENCOUNTER — Other Ambulatory Visit (HOSPITAL_COMMUNITY): Payer: Self-pay

## 2021-12-28 MED ORDER — HYDROXYCHLOROQUINE SULFATE 200 MG PO TABS
200.0000 mg | ORAL_TABLET | Freq: Two times a day (BID) | ORAL | 0 refills | Status: DC
  Filled 2021-12-28: qty 120, 84d supply, fill #0

## 2021-12-28 NOTE — Telephone Encounter (Signed)
Please schedule patient for a follow up visit. Patient was due January 2023. Thanks! ?

## 2021-12-28 NOTE — Telephone Encounter (Signed)
Next Visit: Due January 2023. Message sent to the front to schedule ?  ?Last Visit: 08/10/2021 ?  ?Labs: 05/01/2021 CBC and CMP WNL.  ESR WNL.  ?  ?Eye exam: 09/21/2021 WNL ?  ?Current Dose per office note 08/10/2021: Plaquenil 200 mg twice daily Monday through Friday ?  ?[id number] rheumatoid arthritis  ?  ?Last Fill: 09/12/2021 ? ?Okay to refill PLQ?  ?

## 2022-01-08 ENCOUNTER — Ambulatory Visit (INDEPENDENT_AMBULATORY_CARE_PROVIDER_SITE_OTHER): Payer: 59 | Admitting: Specialist

## 2022-01-08 ENCOUNTER — Encounter: Payer: Self-pay | Admitting: Specialist

## 2022-01-08 DIAGNOSIS — G5622 Lesion of ulnar nerve, left upper limb: Secondary | ICD-10-CM

## 2022-01-08 DIAGNOSIS — M545 Low back pain, unspecified: Secondary | ICD-10-CM | POA: Diagnosis not present

## 2022-01-08 DIAGNOSIS — M4125 Other idiopathic scoliosis, thoracolumbar region: Secondary | ICD-10-CM

## 2022-01-08 DIAGNOSIS — M542 Cervicalgia: Secondary | ICD-10-CM | POA: Diagnosis not present

## 2022-01-08 DIAGNOSIS — R202 Paresthesia of skin: Secondary | ICD-10-CM

## 2022-01-08 DIAGNOSIS — G5602 Carpal tunnel syndrome, left upper limb: Secondary | ICD-10-CM

## 2022-01-08 DIAGNOSIS — M0579 Rheumatoid arthritis with rheumatoid factor of multiple sites without organ or systems involvement: Secondary | ICD-10-CM

## 2022-01-08 DIAGNOSIS — M47812 Spondylosis without myelopathy or radiculopathy, cervical region: Secondary | ICD-10-CM

## 2022-01-08 DIAGNOSIS — M5136 Other intervertebral disc degeneration, lumbar region: Secondary | ICD-10-CM

## 2022-01-08 DIAGNOSIS — R200 Anesthesia of skin: Secondary | ICD-10-CM

## 2022-01-08 NOTE — Addendum Note (Signed)
Addended by: Basil Dess on: 01/08/2022 04:02 PM ? ? Modules accepted: Orders ? ?

## 2022-01-08 NOTE — Patient Instructions (Signed)
Plan: Avoid overhead lifting and overhead use of the arms. ?Do not lift greater than 5 lbs. ?Adjust head rest in vehicle to prevent hyperextension if rear ended. ?Take extra precautions to avoid falling. ?Avoid frequent bending and stooping  ?No lifting greater than 10 lbs. ?May use ice or moist heat for pain. ?Weight loss is of benefit. ?Best medication for lumbar disc disease is arthritis medications like motrin, celebrex and naprosyn. ?Exercise is important to improve your indurance and does allow people to function better inspite of back pain. ? Use diclofenc gel 3-4 times per day for hand pain.  ?Dr. Krista Blue for reassessment of UE and LE numbness and tingling ?MRI of the lumbar spine to assess for lumbar spinal stenosis or spondylosis associated with scoliosis that may relate to bilateral leg numbness. ?

## 2022-01-08 NOTE — Progress Notes (Signed)
? ?Office Visit Note ?  ?Patient: Taylor Bennett           ?Date of Birth: 1981-02-27           ?MRN: [id number] ?Visit Date: 01/08/2022 ?             ?Requested by: Lennie Odor, PA ?[address] ?[address] ?[place],  [place] [address] ?PCP: Lennie Odor, PA ? ? ?Assessment & Plan: ?Visit Diagnoses:  ?1. Low back pain, unspecified back pain laterality, unspecified chronicity, unspecified whether sciatica present   ?2. Cubital tunnel syndrome on left   ?3. Cervicalgia   ?4. Numbness and tingling of both upper extremities   ?5. Carpal tunnel syndrome, left upper limb   ?6. Spondylosis without myelopathy or radiculopathy, cervical region   ?7. Rheumatoid arthritis with rheumatoid factor of multiple sites without organ or systems involvement ([place])   ? ? ?  ?Plan: Avoid overhead lifting and overhead use of the arms. ?Do not lift greater than 5 lbs. ?Adjust head rest in vehicle to prevent hyperextension if rear ended. ?Take extra precautions to avoid falling. ?Avoid frequent bending and stooping  ?No lifting greater than 10 lbs. ?May use ice or moist heat for pain. ?Weight loss is of benefit. ?Best medication for lumbar disc disease is arthritis medications like motrin, celebrex and naprosyn. ?Exercise is important to improve your indurance and does allow people to function better inspite of back pain. ? Use diclofenc gel 3-4 times per day for hand pain.  ?Dr. Krista Blue for reassessment of UE and LE numbness and tingling ?MRI of the lumbar spine to assess for lumbar spinal stenosis or spondylosis associated with scoliosis that may relate to bilateral leg numbness. ?  ?Follow-Up Instructions: Return in about 4 weeks (around 01/08/2022 ? ?Follow-Up Instructions: No follow-ups on file.  ? ?Orders:  ?No orders of the defined types were placed in this encounter. ? ?No orders of the defined types were placed in this encounter. ? ? ? ? Procedures: ?No procedures performed ? ? ?Clinical Data: ?No additional  findings. ? ? ?Subjective: ?Chief Complaint  ?Patient presents with  ? Lower Back - Follow-up  ? ? ?41 year old female with long history of back and neck pain with accompanying numbness and tingling and sciatica into the right side more than the left. I am weak in my legs because they hurt. She has RA and pain waxes and wanes. Has numbness into the arm intermittantly. Has been to [facility] for IBS,  ? ?Review of Systems  ?Constitutional: Negative.   ?HENT: Negative.    ?Eyes: Negative.   ?Respiratory: Negative.    ?Cardiovascular: Negative.   ?Gastrointestinal: Negative.   ?Endocrine: Negative.   ?Genitourinary: Negative.   ?Musculoskeletal: Negative.   ?Skin: Negative.   ?Allergic/Immunologic: Negative.   ?Neurological: Negative.   ?Hematological: Negative.   ?Psychiatric/Behavioral: Negative.    ? ? ?Objective: ?Vital Signs: There were no vitals taken for this visit. ? ?Physical Exam ?Constitutional:   ?   Appearance: She is well-developed.  ?HENT:  ?   Head: Normocephalic and atraumatic.  ?Eyes:  ?   Pupils: Pupils are equal, round, and reactive to light.  ?Pulmonary:  ?   Effort: Pulmonary effort is normal.  ?   Breath sounds: Normal breath sounds.  ?Abdominal:  ?   General: Bowel sounds are normal.  ?   Palpations: Abdomen is soft.  ?Musculoskeletal:  ?   Cervical back: Normal range of motion and neck supple.  ?  Lumbar back: Negative right straight leg raise test and negative left straight leg raise test.  ?Skin: ?   General: Skin is warm and dry.  ?Neurological:  ?   Mental Status: She is alert and oriented to person, place, and time.  ?Psychiatric:     ?   Behavior: Behavior normal.     ?   Thought Content: Thought content normal.     ?   Judgment: Judgment normal.  ? ?Back Exam  ? ?Tenderness  ?The patient is experiencing tenderness in the lumbar. ? ?Range of Motion  ?Extension:  abnormal  ?Flexion:  abnormal  ?Lateral bend right:  abnormal  ?Lateral bend left:  abnormal  ?Rotation right:  abnormal   ?Rotation left:  abnormal  ? ?Muscle Strength  ?Right Quadriceps:  5/5  ?Left Quadriceps:  5/5  ?Right Hamstrings:  5/5  ?Left Hamstrings:  5/5  ? ?Tests  ?Straight leg raise right: negative ?Straight leg raise left: negative ? ?Reflexes  ?Patellar:  0/4 ?Achilles:  0/4 ? ?Other  ?Toe walk: normal ?Heel walk: normal ?Sensation: normal ?Gait: normal  ?Erythema: no back redness ?Scars: absent ? ? ? ?Specialty Comments:  ?No specialty comments available. ? ?Imaging: ?No results found. ? ? ?PMFS History: ?Patient Active Problem List  ? Diagnosis Date Noted  ? Paresthesia 11/30/2016  ? Neck pain 10/23/2016  ? Left cervical radiculopathy 10/23/2016  ? Vitamin D deficiency 08/10/2016  ? PCOS (polycystic ovarian syndrome) 06/16/2013  ? Migraines 06/16/2013  ? Hypothyroidism 06/10/2013  ? ?Past Medical History:  ?Diagnosis Date  ? Back pain   ? Cervical pain   ? Cervical spondylolysis   ? DDD (degenerative disc disease), lumbar   ? PCOS (polycystic ovarian syndrome)   ? Rheumatoid arthritis ([place])   ? Scoliosis   ? Thyroid disease   ?  ?Family History  ?Problem Relation Age of Onset  ? Thyroid disease Mother   ? Arthritis Mother   ? Diabetes Father   ? Congestive Heart Failure Father   ? Alcoholism Father   ? Neuropathy Father   ? Arthritis Father   ? Osteoarthritis Maternal Aunt   ? Rheum arthritis Paternal Grandmother   ? Healthy Daughter   ? Polycystic ovary syndrome Daughter   ?  ?Past Surgical History:  ?Procedure Laterality Date  ? ANKLE SURGERY  2009  ? ORIF   ? THYROIDECTOMY    ? WISDOM TOOTH EXTRACTION    ? ?Social History  ? ?Occupational History  ? Occupation: ED - Admissions  ?Tobacco Use  ? Smoking status: Former  ?  Years: 6.00  ?  Types: Cigarettes  ?  Quit date: 2012  ?  Years since quitting: 11.3  ? Smokeless tobacco: Never  ? Tobacco comments:  ?  Social smoker  ?Vaping Use  ? Vaping Use: Never used  ?Substance and Sexual Activity  ? Alcohol use: Yes  ?  Comment: occ  ? Drug use: No  ? Sexual activity: Not  on file  ? ? ? ? ? ? ?

## 2022-01-08 NOTE — Addendum Note (Signed)
Addended by: Basil Dess on: 01/08/2022 04:10 PM ? ? Modules accepted: Orders ? ?

## 2022-01-15 ENCOUNTER — Ambulatory Visit
Admission: RE | Admit: 2022-01-15 | Discharge: 2022-01-15 | Disposition: A | Discharge status: Home / Self Care | Payer: 59 | Source: Ambulatory Visit | Attending: Specialist | Admitting: Specialist

## 2022-01-15 DIAGNOSIS — M545 Low back pain, unspecified: Secondary | ICD-10-CM

## 2022-01-15 DIAGNOSIS — M5126 Other intervertebral disc displacement, lumbar region: Secondary | ICD-10-CM | POA: Diagnosis not present

## 2022-01-15 DIAGNOSIS — M47812 Spondylosis without myelopathy or radiculopathy, cervical region: Secondary | ICD-10-CM

## 2022-01-15 DIAGNOSIS — M47816 Spondylosis without myelopathy or radiculopathy, lumbar region: Secondary | ICD-10-CM | POA: Diagnosis not present

## 2022-01-15 DIAGNOSIS — M4186 Other forms of scoliosis, lumbar region: Secondary | ICD-10-CM | POA: Diagnosis not present

## 2022-01-15 IMAGING — MR MR LUMBAR SPINE W/O CM
4 of 5 series · 27 of 48 positions shown · non-contrast
Comparison: Lumbar spine radiographs [DATE]. Chest radiographs
[DATE].

CLINICAL DATA: Chronic low back pain radiating into the legs, right
worse than left. Intermittent leg numbness.

EXAM:
MRI LUMBAR SPINE WITHOUT CONTRAST
TECHNIQUE: Multiplanar, multisequence MR imaging of the lumbar spine was
performed. No intravenous contrast was administered.

[Series 3: T2 · sagittal · 4.0mm · 1.09mm/px · 6 of 17 slices shown (1 of 2)]
[im 1/17]
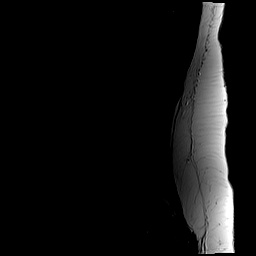
[im 4/17]
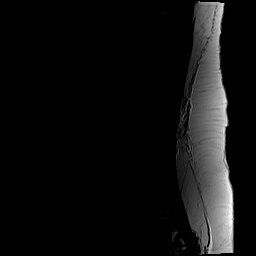
[im 7/17]
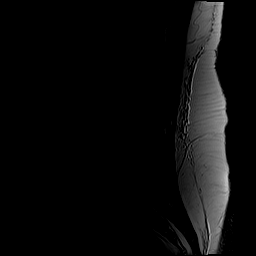
[im 10/17]
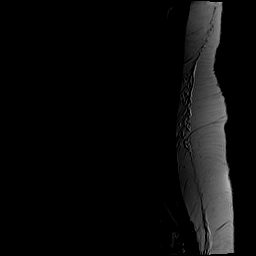
[im 13/17]
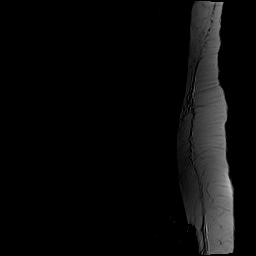
[im 17/17]
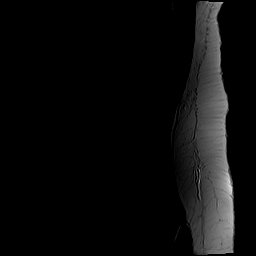

[Series 5: T1 · sagittal · 4.0mm · 1.09mm/px · 6 of 17 slices shown (1 of 2)]
[im 1/17]
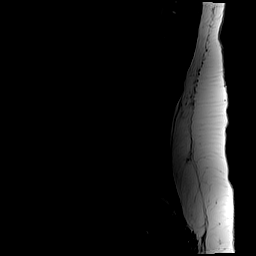
[im 4/17]
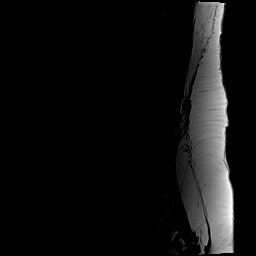
[im 7/17]
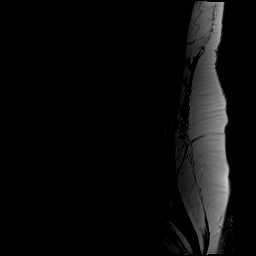
[im 10/17]
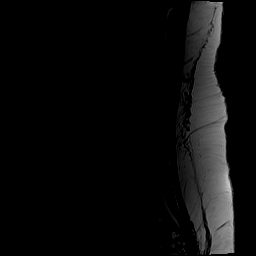
[im 13/17]
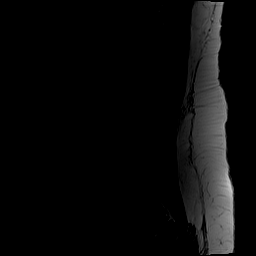
[im 17/17]
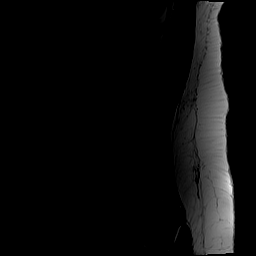

[Series 6: T2 · axial · 4.0mm · 0.39mm/px · z∈[-50,+173]mm · 9 of 42 slices shown (2 of 2)]
[im 1/42]
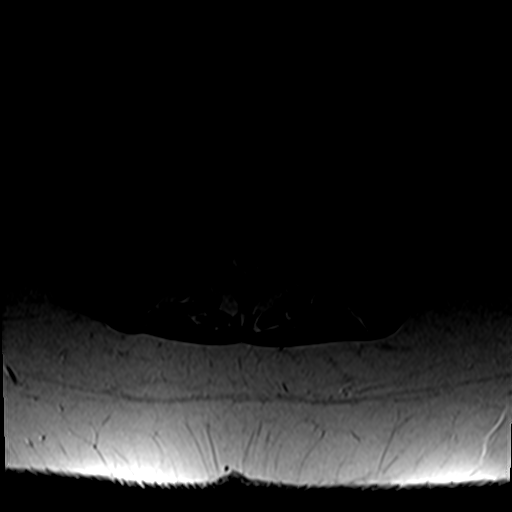
[im 6/42]
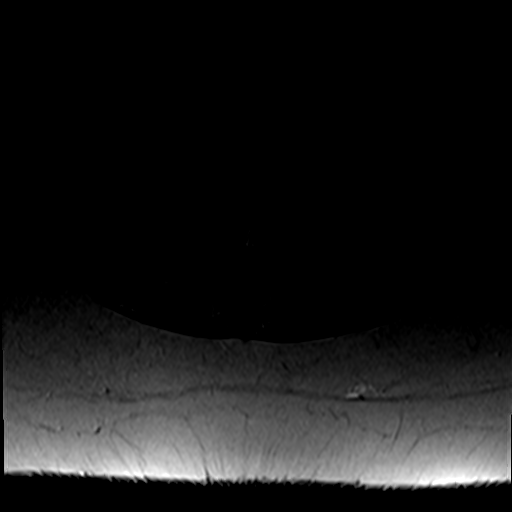
[im 12/42]
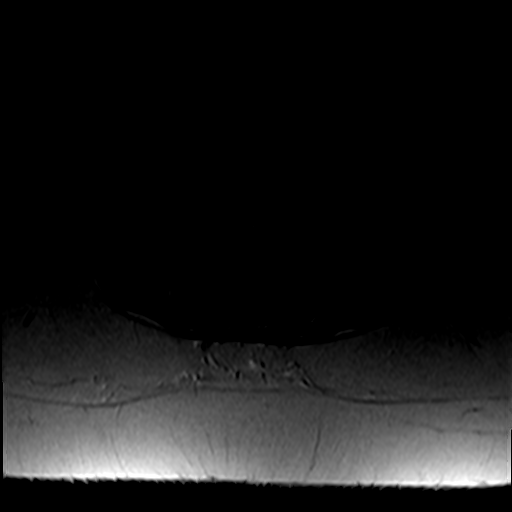
[im 18/42]
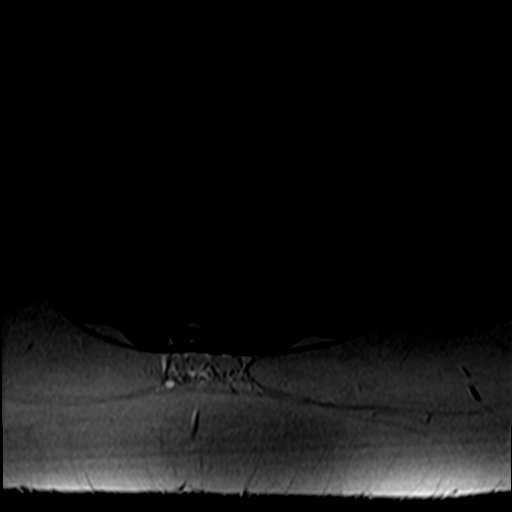
[im 21/42]
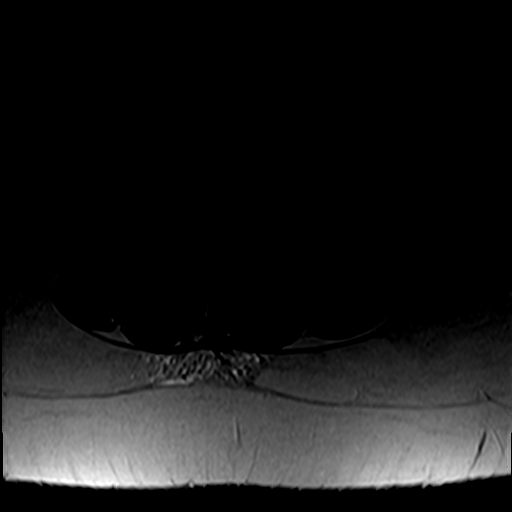
[im 24/42]
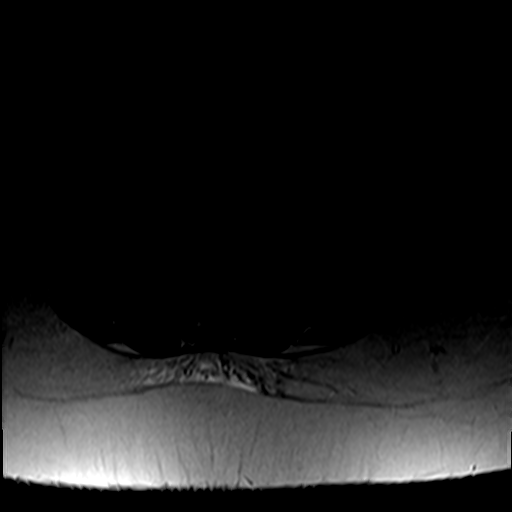
[im 30/42]
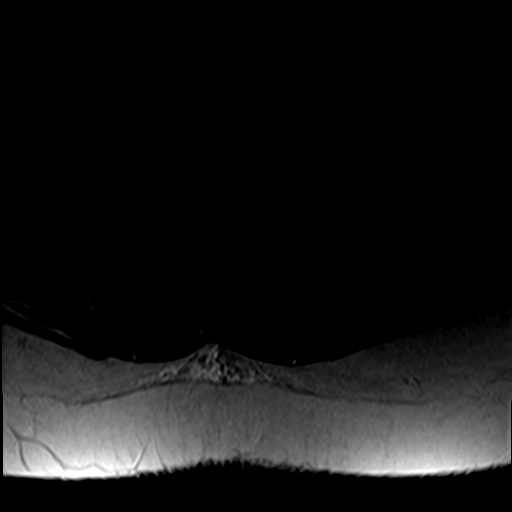
[im 36/42]
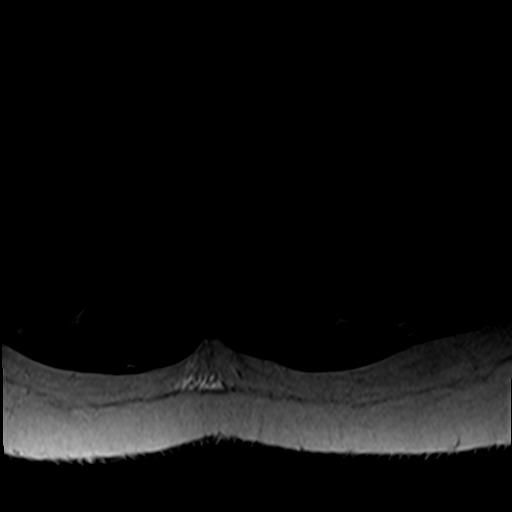
[im 42/42]
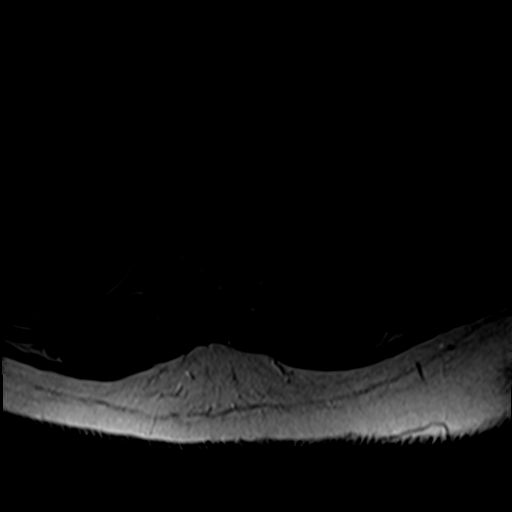

[Series 7: T1 · axial · 4.0mm · 0.39mm/px · z∈[-50,+144]mm · 6 of 42 slices shown (2 of 2)]
[im 1/42]
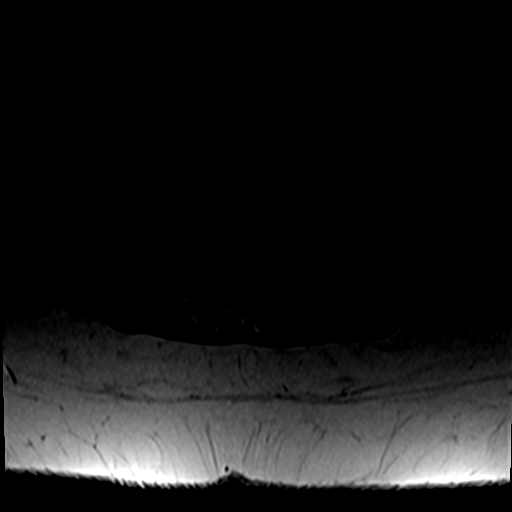
[im 6/42]
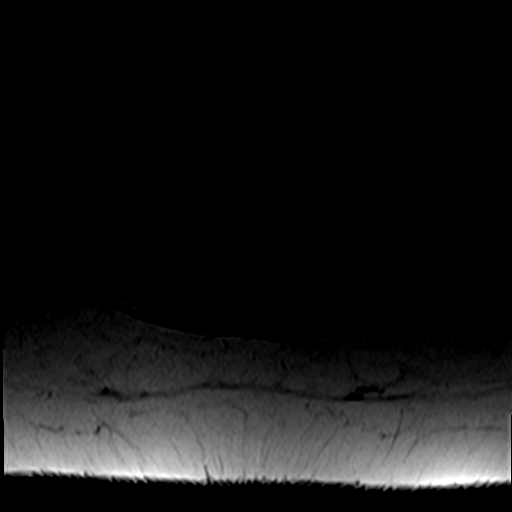
[im 12/42]
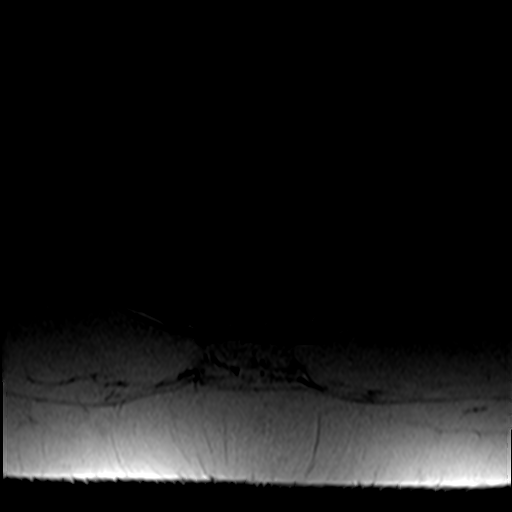
[im 18/42]
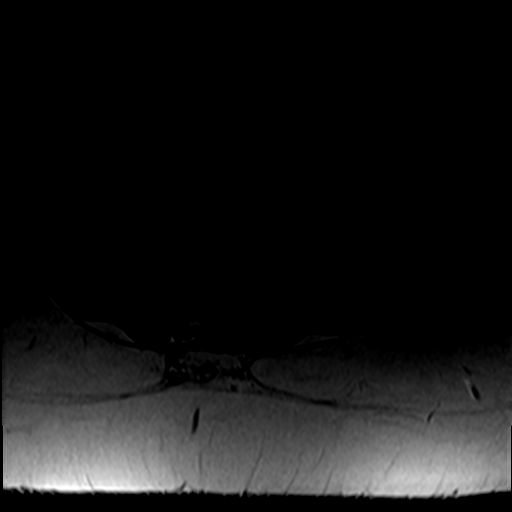
[im 21/42]
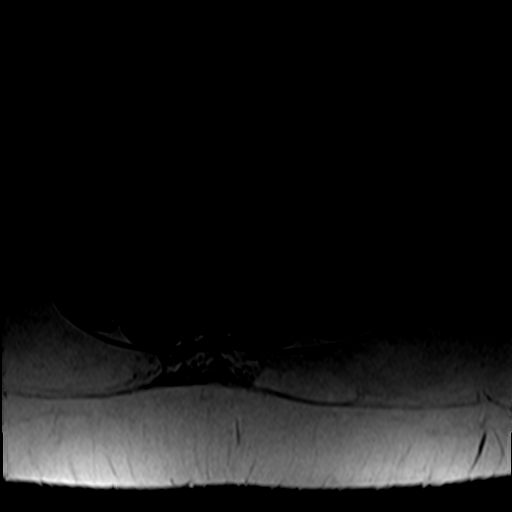
[im 36/42]
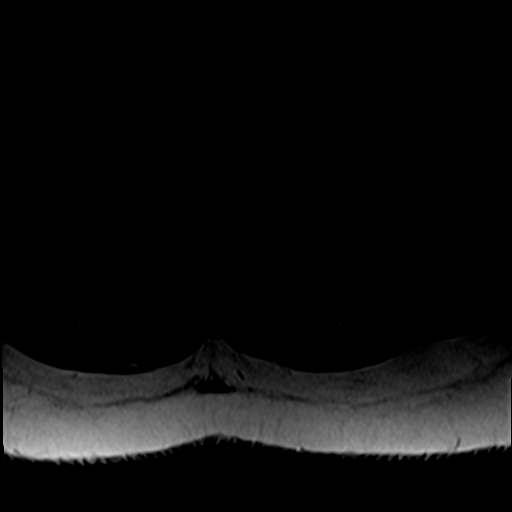

[27 of 48 positions shown; findings below may reference images not displayed]

FINDINGS: Segmentation: The comparison radiographs demonstrate 11 full sized
pairs of ribs, hypoplastic ribs at T12, 4 non rib-bearing lumbar
vertebrae, and a sacralized L5.

Alignment: Mild lumbar levoscoliosis. Trace retrolisthesis of L1 on
L2 and L4 on L5.

Vertebrae: No fracture, suspicious marrow lesion, or significant
marrow edema.

Conus medullaris and cauda equina: Conus extends to the L1 level.
Conus and cauda equina appear normal.

Paraspinal and other soft tissues: Unremarkable.

Disc levels:

Disc desiccation and mild disc space narrowing from L1-2 through
L4-5.

T11-12 and T12-L1: Negative.

L1-2: Minimal disc uncovering.  No stenosis.

L2-3: Minimal disc bulging and small left foraminal disc protrusion
without stenosis.

L3-4: Minimal disc bulging, small left foraminal disc protrusion,
and mild facet hypertrophy without stenosis.

L4-5: Disc bulging eccentric to the left, endplate spurring, disc
space height loss, and mild facet hypertrophy result in mild left
neural foraminal stenosis without spinal stenosis.

L5-S1: Rudimentary disc.  No stenosis.
IMPRESSION: 1. Transitional lumbosacral anatomy.
2. Mild lumbar disc and facet degeneration without spinal stenosis.
3. Mild left neural foraminal stenosis at L4-5.

## 2022-01-16 DIAGNOSIS — Z79899 Other long term (current) drug therapy: Secondary | ICD-10-CM | POA: Diagnosis not present

## 2022-01-16 DIAGNOSIS — M069 Rheumatoid arthritis, unspecified: Secondary | ICD-10-CM | POA: Diagnosis not present

## 2022-02-01 ENCOUNTER — Ambulatory Visit: Payer: 59 | Admitting: Neurology

## 2022-02-01 ENCOUNTER — Encounter: Payer: Self-pay | Admitting: Neurology

## 2022-02-01 VITALS — BP 118/76 | HR 73 | Ht 63.0 in | Wt 209.0 lb

## 2022-02-01 DIAGNOSIS — G43709 Chronic migraine without aura, not intractable, without status migrainosus: Secondary | ICD-10-CM | POA: Diagnosis not present

## 2022-02-01 DIAGNOSIS — M545 Low back pain, unspecified: Secondary | ICD-10-CM | POA: Diagnosis not present

## 2022-02-01 DIAGNOSIS — G8929 Other chronic pain: Secondary | ICD-10-CM | POA: Diagnosis not present

## 2022-02-01 NOTE — Patient Instructions (Signed)
cymbalta 

## 2022-02-01 NOTE — Progress Notes (Signed)
Chief Complaint  Patient presents with   New Patient (Initial Visit)    Room 15, alone NX Taylor Bennett 2019/Internal referral for persistent numbness and tingling bilateral UE and LE, + tinels right elbow with right ulna hand numbness and tingling, assess for cubital tunnel syndrome States she has experienced sx for a while now, hard to say when they began       Taylor Bennett is a 41 y.o. female   Chronic neck, low back pain, Multiple joints pain,  Rheumatoid arthritis, is under the care of rheumatologist Dr. Estanislado Pandy, taking Plaquenil  MRI on cervical and lumbar spine showed degenerative disc disease, on examination, there is no evidence of cervical, lumbar radiculopathy, cervical myelopathy  EMG nerve conduction study in 2018 of bilateral upper extremity was normal   Chronic migraine headaches  At least once a week responding well to Maxalt as needed I have suggested Cymbalta, Effexor as preventive medications, she does not want to proceed    Will continue follow-up with her primary care and rheumatologist  DIAGNOSTIC DATA (LABS, IMAGING, TESTING) - I reviewed patient records, labs, notes, testing and imaging myself where available.   MEDICAL HISTORY:  Taylor Bennett is a 41 year old female, seen in request by orthopedic surgeon Dr. Basil Dess for evaluation of diffuse body achy pain,  I reviewed and summarized the referring note. PMHX. Rheumatoid arthritis Irritability bowel syndrome Chronic migraine Hypothyroidism  I saw her previously in 2018 for frequent low back, neck pain, has been taking ibuprofen, Skelaxin intermittently  Denies persistent sensory or motor deficit, she complains of increased diffuse body achy pain multiple joints pain since she was diagnosed with rheumatoid arthritis couple years ago, now she taking Plaquenil 200 mg twice a day, complains of multiple joints, shoulder, knee, hands, hip joints pain, also worsening neck, low  back pain  Personally reviewed MRI cervical December 19, 2021, and right eccentric disc bulging with uncovertebral spurring at C5-6, moderate right C6 foraminal stenosis, multilevel degenerative changes, no significant canal stenosis  MRI of lumbar spine, transitional lumbosacral anatomy, mild lumbar disc and facet degeneration without stenosis  Lab in 2023, normal TSH, B12, vitamin D, A1c, lipid panel, CBC, HIV, BMP, IgG, protein electrophoresis, hepatitis B, C,  She also has a history of chronic migraine headaches, responding to Maxalt as needed, at least once a week, PHYSICAL EXAM:   Vitals:   02/01/22 1531  BP: 118/76  Pulse: 73  Weight: 209 lb (94.8 kg)  Height: '5\' 3"'$  (1.6 m)     Body mass index is 37.02 kg/m.  PHYSICAL EXAMNIATION:  Gen: NAD, conversant, well nourised, well groomed                     Cardiovascular: Regular rate rhythm, no peripheral edema, warm, nontender. Eyes: Conjunctivae clear without exudates or hemorrhage Neck: Supple, no carotid bruits. Pulmonary: Clear to auscultation bilaterally   NEUROLOGICAL EXAM:  MENTAL STATUS: Speech/cognition: Awake, alert, oriented to history taking and casual conversation CRANIAL NERVES: CN II: Visual fields are full to confrontation. Pupils are round equal and briskly reactive to light. CN III, IV, VI: extraocular movement are normal. No ptosis. CN V: Facial sensation is intact to light touch CN VII: Face is symmetric with normal eye closure  CN VIII: Hearing is normal to causal conversation. CN IX, X: Phonation is normal. CN XI: Head turning and shoulder shrug are intact  MOTOR: There is no pronator drift of out-stretched arms. Muscle  bulk and tone are normal. Muscle strength is normal.  REFLEXES: Reflexes are 2+ and symmetric at the biceps, triceps, knees, and ankles. Plantar responses are flexor.  SENSORY: Intact to light touch, pinprick and vibratory sensation are intact in fingers and  toes.  COORDINATION: There is no trunk or limb dysmetria noted.  GAIT/STANCE: Posture is normal. Gait is steady with normal steps, base, arm swing, and turning. Heel and toe walking are normal. Tandem gait is normal.  Romberg is absent.  REVIEW OF SYSTEMS:  Full 14 system review of systems performed and notable only for as above All other review of systems were negative.   ALLERGIES: No Known Allergies  HOME MEDICATIONS: Current Outpatient Medications  Medication Sig Dispense Refill   Ascorbic Acid (VITAMIN C PO) Take by mouth daily.     Cholecalciferol (VITAMIN D PO) Take 5,000 Units by mouth 2 (two) times daily.      diclofenac Sodium (VOLTAREN) 1 % GEL Apply 2 g topically 4 (four) times daily. 300 g 2   dicyclomine (BENTYL) 10 MG capsule Take 1 capsule (10 mg total) by mouth 3 (three) times daily as needed for abdominal pain 90 capsule 3   etonogestrel-ethinyl estradiol (NUVARING) 0.12-0.015 MG/24HR vaginal ring Insert 1 ring vaginally; leave in place for 3 weeks, remove, and replace with a new ring.  Take 7 day break after every third ring Vaginal as directed 21 day(s) 3 each 4   hydroxychloroquine (PLAQUENIL) 200 MG tablet Take 1 tablet (200 mg total) by mouth 2 (two) times daily from Monday to Friday. 120 tablet 0   ibuprofen (ADVIL,MOTRIN) 200 MG tablet Takes '600mg'$  - '800mg'$  daily, as needed.     loratadine (CLARITIN) 10 MG tablet Take 10 mg by mouth daily.     meclizine (ANTIVERT) 25 MG tablet Take 1 tablet (25 mg total) by mouth 3 (three) times daily as needed for dizziness. 20 tablet 0   Metaxalone (SKELAXIN PO) Take by mouth as needed.     Multiple Vitamin (MULTIVITAMIN PO) Take by mouth daily.     rizatriptan (MAXALT) 10 MG tablet Take 1 tablet by mouth at the onset of headache. may repeat in 2 hours if needed (max 2 tabs in 24 hours) 6 tablet 3   thyroid (NP THYROID) 90 MG tablet Take 1 tablet (90 mg total) by mouth daily. 90 tablet 3   ZINC CITRATE-PHYTASE PO Take by  mouth.     No current facility-administered medications for this visit.    PAST MEDICAL HISTORY: Past Medical History:  Diagnosis Date   Back pain    Cervical pain    Cervical spondylolysis    DDD (degenerative disc disease), lumbar    PCOS (polycystic ovarian syndrome)    Rheumatoid arthritis ([place])    Scoliosis    Thyroid disease     PAST SURGICAL HISTORY: Past Surgical History:  Procedure Laterality Date   ANKLE SURGERY  2009   ORIF    THYROIDECTOMY     WISDOM TOOTH EXTRACTION      FAMILY HISTORY: Family History  Problem Relation Age of Onset   Thyroid disease Mother    Arthritis Mother    Diabetes Father    Congestive Heart Failure Father    Alcoholism Father    Neuropathy Father    Arthritis Father    Osteoarthritis Maternal Aunt    Rheum arthritis Paternal Grandmother    Healthy Daughter    Polycystic ovary syndrome Daughter     SOCIAL HISTORY: Social  History   Socioeconomic History   Marital status: Significant Other    Spouse name: Not on file   Number of children: 2   Years of education: 2 years college   Highest education level: Not on file  Occupational History   Occupation: ED - Admissions  Tobacco Use   Smoking status: Former    Years: 6.00    Types: Cigarettes    Quit date: 2012    Years since quitting: 11.4   Smokeless tobacco: Never   Tobacco comments:    Social smoker  Scientific laboratory technician Use: Never used  Substance and Sexual Activity   Alcohol use: Yes    Comment: occ   Drug use: No   Sexual activity: Not on file  Other Topics Concern   Not on file  Social History Narrative   Lives at home with her fiance and children.   Right-handed.   1-2 cup caffeine daily.   Social Determinants of Health   Financial Resource Strain: Not on file  Food Insecurity: Not on file  Transportation Needs: Not on file  Physical Activity: Not on file  Stress: Not on file  Social Connections: Not on file  Intimate Partner Violence: Not on  file      Marcial Pacas, M.D. Ph.D.  [facility] [address] [place], [place] [address] Ph: [phone] Fax: [phone]  CC:  Jessy Oto, [place],  [place] [address]  Lennie Odor, [place]

## 2022-02-02 ENCOUNTER — Other Ambulatory Visit (HOSPITAL_COMMUNITY): Payer: Self-pay

## 2022-02-07 ENCOUNTER — Encounter: Payer: Self-pay | Admitting: Specialist

## 2022-02-07 ENCOUNTER — Ambulatory Visit: Payer: 59 | Admitting: Specialist

## 2022-02-07 VITALS — BP 117/78 | HR 87 | Ht 63.0 in | Wt 209.0 lb

## 2022-02-07 DIAGNOSIS — M542 Cervicalgia: Secondary | ICD-10-CM | POA: Diagnosis not present

## 2022-02-07 DIAGNOSIS — R202 Paresthesia of skin: Secondary | ICD-10-CM

## 2022-02-07 DIAGNOSIS — M545 Low back pain, unspecified: Secondary | ICD-10-CM

## 2022-02-07 DIAGNOSIS — G5601 Carpal tunnel syndrome, right upper limb: Secondary | ICD-10-CM | POA: Diagnosis not present

## 2022-02-07 DIAGNOSIS — G5602 Carpal tunnel syndrome, left upper limb: Secondary | ICD-10-CM

## 2022-02-07 DIAGNOSIS — M4125 Other idiopathic scoliosis, thoracolumbar region: Secondary | ICD-10-CM

## 2022-02-07 DIAGNOSIS — M0579 Rheumatoid arthritis with rheumatoid factor of multiple sites without organ or systems involvement: Secondary | ICD-10-CM

## 2022-02-07 DIAGNOSIS — G5622 Lesion of ulnar nerve, left upper limb: Secondary | ICD-10-CM

## 2022-02-07 DIAGNOSIS — R200 Anesthesia of skin: Secondary | ICD-10-CM

## 2022-02-07 DIAGNOSIS — M5136 Other intervertebral disc degeneration, lumbar region: Secondary | ICD-10-CM | POA: Diagnosis not present

## 2022-02-07 NOTE — Progress Notes (Signed)
Office Visit Note   Patient: Taylor Bennett           Date of Birth: May 14, 1981           MRN: [id number] Visit Date: 02/07/2022              Requested by: Lennie Odor, PA [address] [place],  [place] [address] PCP: Lennie Odor, PA   Assessment & Plan: Visit Diagnoses:  1. Low back pain, unspecified back pain laterality, unspecified chronicity, unspecified whether sciatica present   2. Cervicalgia   3. Cubital tunnel syndrome on left   4. Carpal tunnel syndrome, left upper limb   5. Rheumatoid arthritis with rheumatoid factor of multiple sites without organ or systems involvement ([place])   6. Numbness and tingling of both upper extremities   7. Idiopathic scoliosis of thoracolumbar region   8. Lumbar degenerative disc disease   9. Carpal tunnel syndrome, right upper limb     Plan: Avoid overhead lifting and overhead use of the arms. Do not lift greater than 5 lbs. Adjust head rest in vehicle to prevent hyperextension if rear ended. Take extra precautions to avoid falling. Avoid frequent bending and stooping  No lifting greater than 10 lbs. May use ice or moist heat for pain. Weight loss is of benefit. Best medication for lumbar disc disease is arthritis medications like motrin and tylenol. Exercise is important to improve your indurance and does allow people to function better inspite of back pain.  Follow-Up Instructions: Return in about 2 months (around 04/09/2022).   Orders:  No orders of the defined types were placed in this encounter.  No orders of the defined types were placed in this encounter.     Procedures: No procedures performed   Clinical Data: No additional findings.   Subjective: Chief Complaint  Patient presents with   Neck - Follow-up   Lower Back - Follow-up    41 year old female with RA and DDD lumbar spine and mild DDD cervical spine. She has mechanical pain and intermittant arm and leg pain. Apparently saw Dr. Krista Blue  and no EMG/NCV was done and this was because her exam was without focal deficity. She has Persisted with pain complaints.   Review of Systems  Constitutional: Negative.   HENT: Negative.    Eyes: Negative.   Respiratory: Negative.    Cardiovascular: Negative.   Gastrointestinal: Negative.   Endocrine: Negative.   Genitourinary: Negative.   Musculoskeletal: Negative.   Skin: Negative.   Allergic/Immunologic: Negative.   Neurological: Negative.   Hematological: Negative.   Psychiatric/Behavioral: Negative.      Objective: Vital Signs: BP 117/78 (BP Location: Left Arm, Patient Position: Sitting)   Pulse 87   Ht '5\' 3"'$  (1.6 m)   Wt 209 lb (94.8 kg)   BMI 37.02 kg/m   Physical Exam Constitutional:      Appearance: She is well-developed.  HENT:     Head: Normocephalic and atraumatic.  Eyes:     Pupils: Pupils are equal, round, and reactive to light.  Pulmonary:     Effort: Pulmonary effort is normal.     Breath sounds: Normal breath sounds.  Abdominal:     General: Bowel sounds are normal.     Palpations: Abdomen is soft.  Musculoskeletal:     Cervical back: Normal range of motion and neck supple.     Lumbar back: Negative right straight leg raise test and negative left straight leg raise test.  Skin:    General: Skin is warm and dry.  Neurological:     Mental Status: She is alert and oriented to person, place, and time.  Psychiatric:        Behavior: Behavior normal.        Thought Content: Thought content normal.        Judgment: Judgment normal.   Back Exam   Tenderness  The patient is experiencing tenderness in the lumbar and cervical.  Range of Motion  Extension:  abnormal  Flexion:  abnormal  Lateral bend left:  abnormal  Rotation right:  abnormal  Rotation left:  abnormal   Muscle Strength  Right Quadriceps:  5/5  Left Quadriceps:  5/5  Right Hamstrings:  5/5  Left Hamstrings:  5/5   Tests  Straight leg raise right: negative Straight leg raise  left: negative  Reflexes  Patellar:  2/4 Achilles:  2/4  Other  Toe walk: normal Heel walk: normal Gait: normal  Erythema: no back redness Scars: absent    Specialty Comments:  No specialty comments available.  Imaging: No results found.   PMFS History: Patient Active Problem List   Diagnosis Date Noted   Chronic bilateral low back pain 02/01/2022   Chronic migraine w/o aura w/o status migrainosus, not intractable 02/01/2022   Paresthesia 11/30/2016   Neck pain 10/23/2016   Left cervical radiculopathy 10/23/2016   Vitamin D deficiency 08/10/2016   PCOS (polycystic ovarian syndrome) 06/16/2013   Migraines 06/16/2013   Hypothyroidism 06/10/2013   Past Medical History:  Diagnosis Date   Back pain    Cervical pain    Cervical spondylolysis    DDD (degenerative disc disease), lumbar    PCOS (polycystic ovarian syndrome)    Rheumatoid arthritis ([place])    Scoliosis    Thyroid disease     Family History  Problem Relation Age of Onset   Thyroid disease Mother    Arthritis Mother    Diabetes Father    Congestive Heart Failure Father    Alcoholism Father    Neuropathy Father    Arthritis Father    Osteoarthritis Maternal Aunt    Rheum arthritis Paternal Grandmother    Healthy Daughter    Polycystic ovary syndrome Daughter     Past Surgical History:  Procedure Laterality Date   ANKLE SURGERY  2009   ORIF    THYROIDECTOMY     WISDOM TOOTH EXTRACTION     Social History   Occupational History   Occupation: ED - Admissions  Tobacco Use   Smoking status: Former    Years: 6.00    Types: Cigarettes    Quit date: 2012    Years since quitting: 11.4   Smokeless tobacco: Never   Tobacco comments:    Social smoker  Vaping Use   Vaping Use: Never used  Substance and Sexual Activity   Alcohol use: Yes    Comment: occ   Drug use: No   Sexual activity: Not on file

## 2022-02-07 NOTE — Patient Instructions (Signed)
Avoid overhead lifting and overhead use of the arms. Do not lift greater than 5 lbs. Adjust head rest in vehicle to prevent hyperextension if rear ended. Take extra precautions to avoid falling. Avoid frequent bending and stooping  No lifting greater than 10 lbs. May use ice or moist heat for pain. Weight loss is of benefit. Best medication for lumbar disc disease is arthritis medications like motrin and tylenol. Exercise is important to improve your indurance and does allow people to function better inspite of back pain.

## 2022-02-09 DIAGNOSIS — M069 Rheumatoid arthritis, unspecified: Secondary | ICD-10-CM | POA: Diagnosis not present

## 2022-02-09 DIAGNOSIS — H6120 Impacted cerumen, unspecified ear: Secondary | ICD-10-CM | POA: Diagnosis not present

## 2022-02-09 DIAGNOSIS — J069 Acute upper respiratory infection, unspecified: Secondary | ICD-10-CM | POA: Diagnosis not present

## 2022-03-12 ENCOUNTER — Other Ambulatory Visit (HOSPITAL_COMMUNITY): Payer: Self-pay

## 2022-03-12 ENCOUNTER — Other Ambulatory Visit: Payer: Self-pay | Admitting: Physician Assistant

## 2022-03-12 MED ORDER — HYDROXYCHLOROQUINE SULFATE 200 MG PO TABS
200.0000 mg | ORAL_TABLET | Freq: Two times a day (BID) | ORAL | 0 refills | Status: DC
  Filled 2022-03-12: qty 120, 84d supply, fill #0

## 2022-03-12 NOTE — Telephone Encounter (Signed)
Next Visit: 08/10/2022  Last Visit: 08/10/2021  Labs: 09/20/2021, BMP Glucose 105,   Eye exam: 09/21/2021   Current Dose per office note 08/10/2021: Plaquenil 200 mg twice daily Monday through Friday  PQ:DIYMEBRAXENM rheumatoid arthritis   Last Fill: 12/28/2021  Okay to refill Plaquenil?

## 2022-03-14 ENCOUNTER — Other Ambulatory Visit (HOSPITAL_COMMUNITY): Payer: Self-pay

## 2022-03-14 MED ORDER — ETONOGESTREL-ETHINYL ESTRADIOL 0.12-0.015 MG/24HR VA RING
VAGINAL_RING | VAGINAL | 1 refills | Status: DC
  Filled 2022-03-14: qty 3, 84d supply, fill #0
  Filled 2022-04-08 – 2022-06-27 (×2): qty 3, 84d supply, fill #1

## 2022-03-15 ENCOUNTER — Telehealth: Payer: Self-pay | Admitting: Rheumatology

## 2022-03-15 NOTE — Telephone Encounter (Signed)
Patient states she decided to hold off on the MTX. Patient has continued to take the PLQ. Patient wanted to know when she is due for lab work and a follow up. Patient advised she is due for both and has been schedule for an appointment on 03/22/2022 at 8:40 am.

## 2022-03-15 NOTE — Telephone Encounter (Signed)
Patient called stating she had to postpone her GI workup until next year due to the out of pocket expense and her father passing away.  Patient states she is doing okay with Plaquenil and doesn't want to start the Methotrexate yet.  Patient requested a return call regarding labwork.

## 2022-03-16 NOTE — Progress Notes (Unsigned)
Office Visit Note  Patient: Taylor Bennett             Date of Birth: [birth year]           MRN: [id number]             PCP: Lennie Odor, PA Referring: Lennie Odor, PA Visit Date: 03/22/2022 Occupation: '@GUAROCC'$ @  Subjective:  Pain in multiple joints   History of Present Illness: Taylor Bennett is a 41 y.o. female with history of seropositive rheumatoid arthritis, osteoarthritis, and myofascial pain.  She is taking plaquenil 200 mg 1 tablet by mouth twice daily Monday through Friday.  She is tolerating Plaquenil without any side effects.  Patient decided against initiating methotrexate in December 2022.  She states that she continues to have flares once or twice a month depending on her activity level.  She is typically experiences increased joint pain and stiffness and intermittent formation with overuse activities including typing or housework.  She notices intermittent inflammation in her wrist and ankle joints.  She has been taking ibuprofen as needed for symptomatic relief.  She continues to experience intermittent myalgias and muscle tenderness due to myofascial pain.  She also has ongoing discomfort in her lower back.  She has been taking Skelaxin half tablet at bedtime which has been helpful.  Overall she has been sleeping better.  She continues to experience fatigue on a daily basis. She states that she has noticed increased hair loss and requested a referral to dermatology.     Activities of Daily Living:  Patient reports morning stiffness for 2-3 hours.   Patient Reports nocturnal pain.  Difficulty dressing/grooming: Denies Difficulty climbing stairs: Reports Difficulty getting out of chair: Reports Difficulty using hands for taps, buttons, cutlery, and/or writing: Reports  Review of Systems  Constitutional:  Positive for fatigue.  HENT:  Positive for mouth dryness. Negative for mouth sores.   Eyes:  Positive for photophobia and dryness.  Respiratory:  Negative  for shortness of breath.   Cardiovascular:  Negative for chest pain and palpitations.  Gastrointestinal:  Positive for constipation and diarrhea. Negative for blood in stool.  Endocrine: Negative for increased urination.  Genitourinary:  Negative for involuntary urination.  Musculoskeletal:  Positive for joint pain, joint pain, joint swelling, myalgias, muscle weakness, morning stiffness, muscle tenderness and myalgias.  Skin:  Positive for rash and hair loss. Negative for color change and sensitivity to sunlight.  Allergic/Immunologic: Negative for susceptible to infections.  Neurological:  Positive for dizziness and headaches.  Hematological:  Negative for swollen glands.  Psychiatric/Behavioral:  Positive for sleep disturbance. Negative for depressed mood. The patient is not nervous/anxious.     PMFS History:  Patient Active Problem List   Diagnosis Date Noted   Chronic bilateral low back pain 02/01/2022   Chronic migraine w/o aura w/o status migrainosus, not intractable 02/01/2022   Paresthesia 11/30/2016   Neck pain 10/23/2016   Left cervical radiculopathy 10/23/2016   Vitamin D deficiency 08/10/2016   PCOS (polycystic ovarian syndrome) 06/16/2013   Migraines 06/16/2013   Hypothyroidism 06/10/2013    Past Medical History:  Diagnosis Date   Back pain    Cervical pain    Cervical spondylolysis    DDD (degenerative disc disease), lumbar    PCOS (polycystic ovarian syndrome)    Rheumatoid arthritis ([place])    Scoliosis    Thyroid disease     Family History  Problem Relation Age of Onset   Thyroid disease Mother    Arthritis  Mother    Diabetes Father    Congestive Heart Failure Father    Alcoholism Father    Neuropathy Father    Arthritis Father    Osteoarthritis Maternal Aunt    Rheum arthritis Paternal Grandmother    Healthy Daughter    Polycystic ovary syndrome Daughter    Past Surgical History:  Procedure Laterality Date   ANKLE SURGERY  2009   ORIF     THYROIDECTOMY     WISDOM TOOTH EXTRACTION     Social History   Social History Narrative   Lives at home with her fiance and children.   Right-handed.   1-2 cup caffeine daily.   Immunization History  Administered Date(s) Administered   Influenza-Unspecified 06/03/2014, 06/20/2015   Moderna Sars-Covid-2 Vaccination 10/02/2019, 11/02/2019, 07/09/2020     Objective: Vital Signs: BP 120/85 (BP Location: Left Arm, Patient Position: Sitting, Cuff Size: Normal)   Pulse 74   Ht '5\' 3"'$  (1.6 m)   Wt 209 lb 9.6 oz (95.1 kg)   BMI 37.13 kg/m    Physical Exam Vitals and nursing note reviewed.  Constitutional:      Appearance: She is well-developed.  HENT:     Head: Normocephalic and atraumatic.  Eyes:     Conjunctiva/sclera: Conjunctivae normal.  Cardiovascular:     Rate and Rhythm: Normal rate and regular rhythm.     Heart sounds: Normal heart sounds.  Pulmonary:     Effort: Pulmonary effort is normal.     Breath sounds: Normal breath sounds.  Abdominal:     General: Bowel sounds are normal.     Palpations: Abdomen is soft.  Musculoskeletal:     Cervical back: Normal range of motion.  Skin:    General: Skin is warm and dry.     Capillary Refill: Capillary refill takes less than 2 seconds.  Neurological:     Mental Status: She is alert and oriented to person, place, and time.  Psychiatric:        Behavior: Behavior normal.      Musculoskeletal Exam: C-spine, thoracic spine, lumbar spine have good range of motion.  Discomfort in her lower back noted.  Midline spinal tenderness in the lumbar region.  Shoulder joints, elbow joints, wrist joints, MCPs, PIPs, DIPs have good range of motion with no synovitis.  Complete fist formation bilaterally.  Hip joints have good range of motion with no groin pain.  Knee joints have good range of motion with no warmth or effusion.  Ankle joints have good range of motion with no tenderness or joint swelling.  No tenderness over MTP joints.    CDAI Exam: CDAI Score: 0.4  Patient Global: 2 mm; Provider Global: 2 mm Swollen: 0 ; Tender: 0  Joint Exam 03/22/2022   No joint exam has been documented for this visit   There is currently no information documented on the homunculus. Go to the Rheumatology activity and complete the homunculus joint exam.  Investigation: No additional findings.  Imaging: No results found.  Recent Labs: Lab Results  Component Value Date   WBC 8.9 09/20/2021   HGB 12.4 09/20/2021   PLT 374 09/20/2021   NA 139 09/20/2021   K 3.8 09/20/2021   CL 104 09/20/2021   CO2 26 09/20/2021   GLUCOSE 105 (H) 09/20/2021   BUN 8 09/20/2021   CREATININE 0.77 09/20/2021   BILITOT 0.5 05/01/2021   ALKPHOS 58 07/19/2015   AST 15 05/01/2021   ALT 12 05/01/2021   PROT 6.8  08/10/2021   ALBUMIN 3.6 07/19/2015   CALCIUM 9.1 09/20/2021   GFRAA 104 12/08/2020    Speciality Comments: PLQ EYe Exam: 09/21/2021 WNL @ [facility] f/u 3 months  TB Gold: 07/21/2021 Negative  Procedures:  No procedures performed Allergies: Patient has no known allergies.   Assessment / Plan:     Visit Diagnoses: Seropositive rheumatoid arthritis ([place]) -  +'14 3 3 '$ eta, elevated sedimentation rate in March 2021, +synovitis on ultrasound: She has no synovitis on examination today.  She continues to experience intermittent pain and inflammation in both wrists and both ankle joints.  Her symptoms are typically exacerbated by overuse activities including typing, household work, and standing on her feet for long periods of time.  According to the patient she typically has 1-2 flares per month.  She has been taking Plaquenil 200 mg 1 tablet by mouth twice daily Monday through Friday.  She continues to tolerate Plaquenil without any side effects.  She decided against adding on methotrexate as combination therapy in December 2022.  She states her symptoms have been tolerable taking ibuprofen as needed for symptomatic relief.  She  is apprehensive to try methotrexate due to possible side effects including hair loss.  She has noticed ongoing hair thinning which has concerned her.  A referral to dermatology was placed today.  I also discussed the use of nutrafol which is available over-the-counter.  She was advised to notify us if she develops increased joint pain or joint swelling.  Discussed that if she continues to have recurrent flares her rheumatoid arthritis is not adequately controlled on Plaquenil as monotherapy.  Discussed the risk of joint damage and radiographic progression.  She will follow-up in the office in 5 months or sooner if needed.  High risk medication use - Plaquenil 200 mg 1 tablet by mouth twice daily monday through friday.  MTX added in December 2022. - Plan: CBC with Differential/Platelet, COMPLETE METABOLIC PANEL WITH GFR PLQ Eye Exam: 09/21/2021 WNL @ [facility]. CBC and CMP updated today. Her next lab work will be due in 5 months to monitor for drug toxicity.   Primary osteoarthritis of both hands: No inflammation was noted on examination today.  She was able to make a complete fist bilaterally.  Discussed the importance of joint protection and muscle strengthening.  Chondromalacia patellae, left knee: She has good range of motion of the left knee joint on examination today.  No warmth or effusion was noted.  Primary osteoarthritis of both feet: She has had intermittent discomfort in both ankle joints. No tenderness or synovitis noted on examination today.  Chronic SI joint pain: She continues to experience intermittent discomfort in her lower back.  DDD (degenerative disc disease), cervical: She has good range of motion of the C-spine.  No symptoms of radiculopathy.  Other idiopathic scoliosis, thoracic region  DDD (degenerative disc disease), lumbar: She continues to have chronic discomfort in her lower back.  She takes Skelaxin half tablet at bedtime and ibuprofen as needed for  symptomatic relief.  Myofascial pain: She continues to experience intermittent myalgias and muscle tenderness consistent myofascial pain.  She has started to take Skelaxin half tablet at bedtime which has been helpful in alleviating her symptoms.  Discussed the importance of regular exercise and good sleep hygiene.  Plantar fasciitis, bilateral: She continues to experience symptoms intermittently.  Discussed the importance of wearing proper fitting shoes.  Trochanteric bursitis of both hips: She has tenderness palpation over bilateral trochanteric bursa.  She is considering getting a new mattress.  Hair loss: Referral to dermatology will be placed today.   Plantar warts:Right great toe.  Referral to dermatology will be placed today.   Other medical conditions are listed as follows:   PCOS (polycystic ovarian syndrome)  Hx of migraines  Vitamin D deficiency  Postoperative hypothyroidism  Family history of rheumatoid arthritis  Orders: Orders Placed This Encounter  Procedures   CBC with Differential/Platelet   COMPLETE METABOLIC PANEL WITH GFR   No orders of the defined types were placed in this encounter.    Follow-Up Instructions: Return in about 5 months (around 08/22/2022) for Rheumatoid arthritis, Osteoarthritis, Myofascial pain.   Ofilia Neas, PA-C  Note - This record has been created using Dragon software.  Chart creation errors have been sought, but may not always  have been located. Such creation errors do not reflect on  the standard of medical care.,

## 2022-03-22 ENCOUNTER — Ambulatory Visit: Payer: 59 | Admitting: Physician Assistant

## 2022-03-22 ENCOUNTER — Encounter: Payer: Self-pay | Admitting: Physician Assistant

## 2022-03-22 VITALS — BP 120/85 | HR 74 | Ht 63.0 in | Wt 209.6 lb

## 2022-03-22 DIAGNOSIS — M5030 Other cervical disc degeneration, unspecified cervical region: Secondary | ICD-10-CM | POA: Diagnosis not present

## 2022-03-22 DIAGNOSIS — M7062 Trochanteric bursitis, left hip: Secondary | ICD-10-CM

## 2022-03-22 DIAGNOSIS — M5136 Other intervertebral disc degeneration, lumbar region: Secondary | ICD-10-CM | POA: Diagnosis not present

## 2022-03-22 DIAGNOSIS — M51369 Other intervertebral disc degeneration, lumbar region without mention of lumbar back pain or lower extremity pain: Secondary | ICD-10-CM

## 2022-03-22 DIAGNOSIS — M722 Plantar fascial fibromatosis: Secondary | ICD-10-CM

## 2022-03-22 DIAGNOSIS — M4124 Other idiopathic scoliosis, thoracic region: Secondary | ICD-10-CM

## 2022-03-22 DIAGNOSIS — M533 Sacrococcygeal disorders, not elsewhere classified: Secondary | ICD-10-CM | POA: Diagnosis not present

## 2022-03-22 DIAGNOSIS — M059 Rheumatoid arthritis with rheumatoid factor, unspecified: Secondary | ICD-10-CM | POA: Diagnosis not present

## 2022-03-22 DIAGNOSIS — Z8669 Personal history of other diseases of the nervous system and sense organs: Secondary | ICD-10-CM

## 2022-03-22 DIAGNOSIS — M19072 Primary osteoarthritis, left ankle and foot: Secondary | ICD-10-CM

## 2022-03-22 DIAGNOSIS — Z8261 Family history of arthritis: Secondary | ICD-10-CM

## 2022-03-22 DIAGNOSIS — M19071 Primary osteoarthritis, right ankle and foot: Secondary | ICD-10-CM

## 2022-03-22 DIAGNOSIS — G8929 Other chronic pain: Secondary | ICD-10-CM

## 2022-03-22 DIAGNOSIS — M19041 Primary osteoarthritis, right hand: Secondary | ICD-10-CM | POA: Diagnosis not present

## 2022-03-22 DIAGNOSIS — E890 Postprocedural hypothyroidism: Secondary | ICD-10-CM

## 2022-03-22 DIAGNOSIS — Z79899 Other long term (current) drug therapy: Secondary | ICD-10-CM

## 2022-03-22 DIAGNOSIS — L659 Nonscarring hair loss, unspecified: Secondary | ICD-10-CM

## 2022-03-22 DIAGNOSIS — B070 Plantar wart: Secondary | ICD-10-CM

## 2022-03-22 DIAGNOSIS — M7061 Trochanteric bursitis, right hip: Secondary | ICD-10-CM

## 2022-03-22 DIAGNOSIS — E559 Vitamin D deficiency, unspecified: Secondary | ICD-10-CM

## 2022-03-22 DIAGNOSIS — M2242 Chondromalacia patellae, left knee: Secondary | ICD-10-CM | POA: Diagnosis not present

## 2022-03-22 DIAGNOSIS — E282 Polycystic ovarian syndrome: Secondary | ICD-10-CM

## 2022-03-22 DIAGNOSIS — M7918 Myalgia, other site: Secondary | ICD-10-CM

## 2022-03-22 DIAGNOSIS — M19042 Primary osteoarthritis, left hand: Secondary | ICD-10-CM

## 2022-03-22 NOTE — Addendum Note (Signed)
Addended by: Earnestine Mealing on: 03/22/2022 01:57 PM   Modules accepted: Orders

## 2022-03-22 NOTE — Progress Notes (Signed)
CBC WNL

## 2022-03-23 LAB — CBC WITH DIFFERENTIAL/PLATELET
Absolute Monocytes: 673 cells/uL (ref 200–950)
Basophils Absolute: 53 cells/uL (ref 0–200)
Basophils Relative: 0.8 %
Eosinophils Absolute: 59 cells/uL (ref 15–500)
Eosinophils Relative: 0.9 %
HCT: 37.6 % (ref 35.0–45.0)
Hemoglobin: 12.7 g/dL (ref 11.7–15.5)
Lymphs Abs: 1881 cells/uL (ref 850–3900)
MCH: 31.5 pg (ref 27.0–33.0)
MCHC: 33.8 g/dL (ref 32.0–36.0)
MCV: 93.3 fL (ref 80.0–100.0)
MPV: 9.6 fL (ref 7.5–12.5)
Monocytes Relative: 10.2 %
Neutro Abs: 3934 cells/uL (ref 1500–7800)
Neutrophils Relative %: 59.6 %
Platelets: 363 10*3/uL (ref 140–400)
RBC: 4.03 10*6/uL (ref 3.80–5.10)
RDW: 12.5 % (ref 11.0–15.0)
Total Lymphocyte: 28.5 %
WBC: 6.6 10*3/uL (ref 3.8–10.8)

## 2022-03-23 LAB — COMPLETE METABOLIC PANEL WITH GFR
AG Ratio: 1.4 (calc) (ref 1.0–2.5)
ALT: 9 U/L (ref 6–29)
AST: 13 U/L (ref 10–30)
Albumin: 3.6 g/dL (ref 3.6–5.1)
Alkaline phosphatase (APISO): 44 U/L (ref 31–125)
BUN: 10 mg/dL (ref 7–25)
CO2: 22 mmol/L (ref 20–32)
Calcium: 8.5 mg/dL — ABNORMAL LOW (ref 8.6–10.2)
Chloride: 109 mmol/L (ref 98–110)
Creat: 0.82 mg/dL (ref 0.50–0.99)
Globulin: 2.5 g/dL (calc) (ref 1.9–3.7)
Glucose, Bld: 83 mg/dL (ref 65–99)
Potassium: 4.1 mmol/L (ref 3.5–5.3)
Sodium: 139 mmol/L (ref 135–146)
Total Bilirubin: 0.4 mg/dL (ref 0.2–1.2)
Total Protein: 6.1 g/dL (ref 6.1–8.1)
eGFR: 91 mL/min/{1.73_m2} (ref >=60)

## 2022-03-23 NOTE — Progress Notes (Signed)
Calcium is borderline low.  Please advise the patient to increase dietary calcium intake.  Rest of CMP WNL.

## 2022-03-30 ENCOUNTER — Other Ambulatory Visit: Payer: Self-pay | Admitting: Nurse Practitioner

## 2022-03-30 DIAGNOSIS — Z1231 Encounter for screening mammogram for malignant neoplasm of breast: Secondary | ICD-10-CM

## 2022-04-06 DIAGNOSIS — Z309 Encounter for contraceptive management, unspecified: Secondary | ICD-10-CM | POA: Diagnosis not present

## 2022-04-06 DIAGNOSIS — Z1151 Encounter for screening for human papillomavirus (HPV): Secondary | ICD-10-CM | POA: Diagnosis not present

## 2022-04-06 DIAGNOSIS — Z01419 Encounter for gynecological examination (general) (routine) without abnormal findings: Secondary | ICD-10-CM | POA: Diagnosis not present

## 2022-04-06 DIAGNOSIS — Z113 Encounter for screening for infections with a predominantly sexual mode of transmission: Secondary | ICD-10-CM | POA: Diagnosis not present

## 2022-04-09 ENCOUNTER — Other Ambulatory Visit (HOSPITAL_COMMUNITY): Payer: Self-pay

## 2022-04-10 ENCOUNTER — Other Ambulatory Visit (HOSPITAL_COMMUNITY): Payer: Self-pay

## 2022-04-10 MED ORDER — ETONOGESTREL-ETHINYL ESTRADIOL 0.12-0.015 MG/24HR VA RING
1.0000 | VAGINAL_RING | VAGINAL | 4 refills | Status: DC
  Filled 2022-04-10: qty 3, 84d supply, fill #0
  Filled 2022-10-19: qty 3, 63d supply, fill #0
  Filled 2023-01-01 – 2023-01-11 (×2): qty 3, 63d supply, fill #1
  Filled 2023-04-10: qty 3, 63d supply, fill #2

## 2022-04-11 ENCOUNTER — Other Ambulatory Visit (HOSPITAL_COMMUNITY): Payer: Self-pay

## 2022-04-20 ENCOUNTER — Other Ambulatory Visit (HOSPITAL_COMMUNITY): Payer: Self-pay

## 2022-04-20 MED ORDER — NEOMYCIN-POLYMYXIN-HC 3.5-10000-1 OT SOLN
4.0000 [drp] | Freq: Three times a day (TID) | OTIC | 0 refills | Status: DC
  Filled 2022-04-20: qty 10, 7d supply, fill #0

## 2022-04-23 ENCOUNTER — Encounter: Payer: Self-pay | Admitting: Podiatry

## 2022-04-23 ENCOUNTER — Ambulatory Visit: Payer: 59 | Admitting: Podiatry

## 2022-04-23 DIAGNOSIS — B070 Plantar wart: Secondary | ICD-10-CM

## 2022-04-23 NOTE — Progress Notes (Signed)
   Chief Complaint  Patient presents with   Plantar Warts    R plantar wart, bottom, great toe, patient states that she has had for 1-2 months and she has been using collagen strips otc.and nothing is working.     Subjective: 41 y.o. female presenting today for evaluation of a plantar wart to the right great toe the patient has had for about 2 months now.  Patient states she initially noticed it and began applying Compound W.  She will use the Compound W was not working and only creating sensitivity and causing the healthy skin to peel and so she is now applying silicone Band-Aids over the area.  She presents for further treatment and evaluation   Past Medical History:  Diagnosis Date   Back pain    Cervical pain    Cervical spondylolysis    DDD (degenerative disc disease), lumbar    PCOS (polycystic ovarian syndrome)    Rheumatoid arthritis ([place])    Scoliosis    Thyroid disease    Past Surgical History:  Procedure Laterality Date   ANKLE SURGERY  2009   ORIF    THYROIDECTOMY     WISDOM TOOTH EXTRACTION     No Known Allergies   Objective: Physical Exam General: The patient is alert and oriented x3 in no acute distress.   Dermatology: Hyperkeratotic cluster of skin lesion(s) noted to the plantar aspect of the right great toe approximately 1 cm in diameter. Pinpoint bleeding noted upon debridement. Skin is warm, dry and supple bilateral lower extremities. Negative for open lesions or macerations.   Vascular: Palpable pedal pulses bilaterally. No edema or erythema noted. Capillary refill within normal limits.   Neurological: Epicritic and protective threshold grossly intact bilaterally.    Musculoskeletal Exam: No pedal deformities noted   Assessment: #1 plantar wart right great toe    Plan of Care:  #1 Patient was evaluated.  Today we discussed the etiology and different treatment options regarding plantar warts.  Cantharone was recommended but the patient is leaving out  of the country for [place] weeks and is concerned due to the blister formation after the application of Cantharone.  We will wait until she returns. #2  The patient states that she is going on a trip over the next week and then she will be out of the country in [place] and not return until the first week of September. Will plan for Cantharone application when she returns in September.  In the meantime OTC Salinocaine provided to apply daily as tolerated #3 return to clinic first week of September when she gets back from [place] for [place] M. Avyaan Summer, DPM [facility]  Dr. Edrick Kins, [place]                                        [place], [place] [address]                Office [phone]  Fax [phone]

## 2022-04-30 ENCOUNTER — Other Ambulatory Visit (HOSPITAL_COMMUNITY): Payer: Self-pay

## 2022-04-30 ENCOUNTER — Ambulatory Visit
Admission: RE | Admit: 2022-04-30 | Discharge: 2022-04-30 | Disposition: A | Discharge status: Home / Self Care | Payer: 59 | Source: Ambulatory Visit | Attending: Nurse Practitioner | Admitting: Nurse Practitioner

## 2022-04-30 DIAGNOSIS — Z1231 Encounter for screening mammogram for malignant neoplasm of breast: Secondary | ICD-10-CM | POA: Diagnosis not present

## 2022-05-02 ENCOUNTER — Other Ambulatory Visit (HOSPITAL_COMMUNITY): Payer: Self-pay

## 2022-05-23 ENCOUNTER — Ambulatory Visit: Payer: 59 | Admitting: Podiatry

## 2022-05-23 DIAGNOSIS — B070 Plantar wart: Secondary | ICD-10-CM

## 2022-05-23 NOTE — Progress Notes (Signed)
   Chief Complaint  Patient presents with   Plantar Warts    Patient is here for plantar wart removal     Subjective: 41 y.o. female presenting today for follow-up evaluation of a plantar wart to the right great toe the patient has had for about 3 months now.  Patient recently returned from [place].  She did apply the salicylic acid for about 1 week with some mild improvement.  She presents today to have Cantharone applied.  Past Medical History:  Diagnosis Date   Back pain    Cervical pain    Cervical spondylolysis    DDD (degenerative disc disease), lumbar    PCOS (polycystic ovarian syndrome)    Rheumatoid arthritis ([place])    Scoliosis    Thyroid disease    Past Surgical History:  Procedure Laterality Date   ANKLE SURGERY  2009   ORIF    THYROIDECTOMY     WISDOM TOOTH EXTRACTION     No Known Allergies   Objective: Physical Exam General: The patient is alert and oriented x3 in no acute distress.   Dermatology: Hyperkeratotic cluster of skin lesion(s) noted to the plantar aspect of the right great toe approximately 1 cm in diameter. Pinpoint bleeding noted upon debridement. Skin is warm, dry and supple bilateral lower extremities. Negative for open lesions or macerations.   Neurovascular status intact   Musculoskeletal Exam: No pedal deformities noted   Assessment: #1 plantar wart right great toe    Plan of Care:  #1 Patient was evaluated.  #2 light excisional debridement of the plantar verruca was performed using a 312 blade.  Cantharone applied with a light dressing #3 post care instructions provided #4 return to clinic 2 weeks    Edrick Kins, DPM [facility]  Dr. Edrick Kins, DPM    To [address], [place] [address]                Office [phone]  Fax [phone]

## 2022-05-30 ENCOUNTER — Encounter: Payer: Self-pay | Admitting: Podiatry

## 2022-06-06 ENCOUNTER — Ambulatory Visit: Payer: 59 | Admitting: Podiatry

## 2022-06-20 ENCOUNTER — Other Ambulatory Visit (HOSPITAL_COMMUNITY): Payer: Self-pay

## 2022-06-27 ENCOUNTER — Other Ambulatory Visit (HOSPITAL_COMMUNITY): Payer: Self-pay

## 2022-07-11 ENCOUNTER — Other Ambulatory Visit (HOSPITAL_COMMUNITY): Payer: Self-pay

## 2022-07-11 DIAGNOSIS — H6690 Otitis media, unspecified, unspecified ear: Secondary | ICD-10-CM | POA: Diagnosis not present

## 2022-07-11 DIAGNOSIS — R059 Cough, unspecified: Secondary | ICD-10-CM | POA: Diagnosis not present

## 2022-07-11 DIAGNOSIS — J069 Acute upper respiratory infection, unspecified: Secondary | ICD-10-CM | POA: Diagnosis not present

## 2022-07-11 MED ORDER — HYDROCODONE BIT-HOMATROP MBR 5-1.5 MG/5ML PO SOLN
5.0000 mL | Freq: Four times a day (QID) | ORAL | 0 refills | Status: DC | PRN
  Filled 2022-07-11: qty 120, 6d supply, fill #0

## 2022-07-11 MED ORDER — AMOXICILLIN 875 MG PO TABS
875.0000 mg | ORAL_TABLET | Freq: Two times a day (BID) | ORAL | 0 refills | Status: DC
  Filled 2022-07-11: qty 14, 7d supply, fill #0

## 2022-07-11 MED ORDER — FLUCONAZOLE 150 MG PO TABS
150.0000 mg | ORAL_TABLET | ORAL | 0 refills | Status: DC
  Filled 2022-07-11: qty 2, 6d supply, fill #0

## 2022-07-13 ENCOUNTER — Telehealth: Payer: Self-pay | Admitting: *Deleted

## 2022-07-13 NOTE — Telephone Encounter (Signed)
Patient contacted the office stating she has an upper respiratory infection and has been placed on antibiotics. Patient is on PLQ and would like to know if she needs to hold her medication.   Patient advised she does not need to hold her PLQ. Patient expressed understanding.

## 2022-07-16 ENCOUNTER — Other Ambulatory Visit (HOSPITAL_COMMUNITY): Payer: Self-pay

## 2022-07-16 ENCOUNTER — Other Ambulatory Visit: Payer: Self-pay | Admitting: Physician Assistant

## 2022-07-16 MED ORDER — HYDROXYCHLOROQUINE SULFATE 200 MG PO TABS
200.0000 mg | ORAL_TABLET | Freq: Two times a day (BID) | ORAL | 0 refills | Status: DC
  Filled 2022-07-16: qty 120, 84d supply, fill #0

## 2022-07-16 MED ORDER — PREDNISONE 20 MG PO TABS
ORAL_TABLET | ORAL | 0 refills | Status: AC
  Filled 2022-07-16: qty 12, 6d supply, fill #0

## 2022-07-16 NOTE — Telephone Encounter (Signed)
Next Visit: 08/29/2022  Last Visit: 03/22/2022  Labs: 03/22/2022  Eye exam: 09/21/2021   Current Dose per office note on 03/22/2022: Plaquenil 200 mg 1 tablet by mouth twice daily monday through friday.   YO:VZCHYIFOYDXA rheumatoid arthritis   Last Fill: 03/12/2022  Okay to refill Plaquenil?

## 2022-07-17 ENCOUNTER — Other Ambulatory Visit (HOSPITAL_COMMUNITY): Payer: Self-pay

## 2022-07-17 MED ORDER — SULFAMETHOXAZOLE-TRIMETHOPRIM 800-160 MG PO TABS
1.0000 | ORAL_TABLET | Freq: Two times a day (BID) | ORAL | 0 refills | Status: DC
  Filled 2022-07-17: qty 20, 10d supply, fill #0

## 2022-07-31 ENCOUNTER — Other Ambulatory Visit (HOSPITAL_COMMUNITY): Payer: Self-pay

## 2022-07-31 MED ORDER — RIZATRIPTAN BENZOATE 10 MG PO TABS
10.0000 mg | ORAL_TABLET | ORAL | 3 refills | Status: DC
  Filled 2022-07-31: qty 6, 30d supply, fill #0
  Filled 2022-10-23: qty 6, 30d supply, fill #1
  Filled 2023-01-01 – 2023-01-11 (×2): qty 6, 30d supply, fill #2
  Filled 2023-04-10: qty 6, 30d supply, fill #3

## 2022-08-16 NOTE — Progress Notes (Signed)
Office Visit Note  Patient: Taylor Bennett             Date of Birth: [birth year]           MRN: [id number]             PCP: Lennie Odor, PA Referring: Lennie Odor, PA Visit Date: 08/29/2022 Occupation: '@GUAROCC'$ @  Subjective:  Medication management  History of Present Illness: Taylor Bennett is a 41 y.o. female tree of seropositive rheumatoid arthritis, osteoarthritis, degenerative disc disease and myofascial pain syndrome.  She states she developed left ear infection in August 2023.  She states that it turned into bilateral ear infection.  It was treated with antibiotics.  She later developed an upper respiratory tract infection which was also treated with antibiotics.  She was given Diflucan as a preventive medication.  She states she never developed yeast infection.  She continues to have some irritation in her left ear.  She has an appointment coming up with the dermatologist to examine her plantar wart in the skin changes in her left ear canal.  She has been taking hydroxychloroquine on a regular basis.  She has not noticed any joint swelling.  Although she continues to have pain and discomfort in multiple joints and muscles.  She continues to have neck and lower back pain.  She goes for massage therapy which has been helpful.  Patient states she does not obtain to go for physical therapy.  Activities of Daily Living:  Patient reports morning stiffness for 2-3 hours.   Patient Reports nocturnal pain.  Difficulty dressing/grooming: Reports Difficulty climbing stairs: Reports Difficulty getting out of chair: Reports Difficulty using hands for taps, buttons, cutlery, and/or writing: Reports  Review of Systems  Constitutional:  Positive for fatigue.  HENT:  Positive for mouth dryness. Negative for mouth sores.        Nose sores  Eyes:  Positive for dryness.  Respiratory:  Negative for difficulty breathing.   Cardiovascular:  Positive for palpitations.  Gastrointestinal:   Positive for constipation and diarrhea. Negative for blood in stool.  Endocrine: Negative for increased urination.  Genitourinary:  Negative for involuntary urination.  Musculoskeletal:  Positive for joint pain, joint pain, myalgias, muscle weakness, morning stiffness, muscle tenderness and myalgias. Negative for gait problem and joint swelling.  Skin:  Positive for hair loss. Negative for color change and sensitivity to sunlight.  Allergic/Immunologic: Positive for susceptible to infections.  Neurological:  Positive for dizziness, numbness and headaches.  Hematological:  Negative for swollen glands.  Psychiatric/Behavioral:  Positive for sleep disturbance. Negative for depressed mood. The patient is not nervous/anxious.     PMFS History:  Patient Active Problem List   Diagnosis Date Noted   Seropositive rheumatoid arthritis ([place]) 08/29/2022   Primary osteoarthritis of both hands 08/29/2022   Primary osteoarthritis of both feet 08/29/2022   DDD (degenerative disc disease), cervical 08/29/2022   DDD (degenerative disc disease), lumbar 08/29/2022   Chronic bilateral low back pain 02/01/2022   Chronic migraine w/o aura w/o status migrainosus, not intractable 02/01/2022   Paresthesia 11/30/2016   Neck pain 10/23/2016   Left cervical radiculopathy 10/23/2016   Vitamin D deficiency 08/10/2016   PCOS (polycystic ovarian syndrome) 06/16/2013   Migraines 06/16/2013   Hypothyroidism 06/10/2013    Past Medical History:  Diagnosis Date   Back pain    Cervical pain    Cervical spondylolysis    DDD (degenerative disc disease), lumbar    History of IBS  per patient   PCOS (polycystic ovarian syndrome)    Rheumatoid arthritis ([place])    Scoliosis    Thyroid disease     Family History  Problem Relation Age of Onset   Thyroid disease Mother    Arthritis Mother    Diabetes Father    Congestive Heart Failure Father    Alcoholism Father    Neuropathy Father    Arthritis Father     Osteoarthritis Maternal Aunt    Rheum arthritis Paternal Grandmother    Healthy Daughter    Polycystic ovary syndrome Daughter    Past Surgical History:  Procedure Laterality Date   ANKLE SURGERY  2009   ORIF    THYROIDECTOMY     WISDOM TOOTH EXTRACTION     Social History   Social History Narrative   Lives at home with her fiance and children.   Right-handed.   1-2 cup caffeine daily.   Immunization History  Administered Date(s) Administered   Influenza-Unspecified 06/03/2014, 06/20/2015   Moderna Sars-Covid-2 Vaccination 10/02/2019, 11/02/2019, 07/09/2020     Objective: Vital Signs: BP 124/88 (BP Location: Left Arm, Patient Position: Sitting, Cuff Size: Normal)   Pulse 87   Resp 17   Ht '5\' 3"'$  (1.6 m)   Wt 212 lb 9.6 oz (96.4 kg)   BMI 37.66 kg/m    Physical Exam Vitals and nursing note reviewed.  Constitutional:      Appearance: She is well-developed.  HENT:     Head: Normocephalic and atraumatic.  Eyes:     Conjunctiva/sclera: Conjunctivae normal.  Cardiovascular:     Rate and Rhythm: Normal rate and regular rhythm.     Heart sounds: Normal heart sounds.  Pulmonary:     Effort: Pulmonary effort is normal.     Breath sounds: Normal breath sounds.  Abdominal:     General: Bowel sounds are normal.     Palpations: Abdomen is soft.  Musculoskeletal:     Cervical back: Normal range of motion.  Lymphadenopathy:     Cervical: No cervical adenopathy.  Skin:    General: Skin is warm and dry.     Capillary Refill: Capillary refill takes less than 2 seconds.  Neurological:     Mental Status: She is alert and oriented to person, place, and time.  Psychiatric:        Behavior: Behavior normal.      Musculoskeletal Exam: She had limited range of motion of the cervical spine.  She had painful limited range of motion of thoracic and lumbar spine.  Shoulder joints, elbow joints, wrist joints, MCPs PIPs and DIPs with good range of motion with no synovitis.  Hip joints  were in good range of motion.  She had tenderness over bilateral trochanteric bursa.  Knee joints in good range of motion without any warmth swelling or effusion.  There was no tenderness over ankles or MTPs.  She had generalized hyperalgesia and positive tender points.  CDAI Exam: CDAI Score: -- Patient Global: 2 mm; Provider Global: 2 mm Swollen: --; Tender: -- Joint Exam 08/29/2022   No joint exam has been documented for this visit   There is currently no information documented on the homunculus. Go to the Rheumatology activity and complete the homunculus joint exam.  Investigation: No additional findings.  Imaging: No results found.  Recent Labs: Lab Results  Component Value Date   WBC 6.6 03/22/2022   HGB 12.7 03/22/2022   PLT 363 03/22/2022   NA 139 03/22/2022   K  4.1 03/22/2022   CL 109 03/22/2022   CO2 22 03/22/2022   GLUCOSE 83 03/22/2022   BUN 10 03/22/2022   CREATININE 0.82 03/22/2022   BILITOT 0.4 03/22/2022   ALKPHOS 58 07/19/2015   AST 13 03/22/2022   ALT 9 03/22/2022   PROT 6.1 03/22/2022   ALBUMIN 3.6 07/19/2015   CALCIUM 8.5 (L) 03/22/2022   GFRAA 104 12/08/2020    Speciality Comments: PLQ EYe Exam: 09/21/2021 WNL @ [facility] f/u 3 months  TB Gold: 07/21/2021 Negative  Procedures:  No procedures performed Allergies: Patient has no known allergies.   Assessment / Plan:     Visit Diagnoses: Seropositive rheumatoid arthritis ([place]) - +'14 3 3 '$ eta, elevated sedimentation rate in March 2021, +synovitis on ultrasound: She continues to have joint pain and joint stiffness.  No synovitis was noted on the examination.  She has been tolerating hydroxychloroquine without any side effects.  High risk medication use - Plaquenil 200 mg 1 tablet by mouth twice daily monday through friday. PLQ Eye Exam: 09/21/2021 she will need to eye examination soon.  Labs from March 22, 2022 CBC and CMP were normal except calcium was low at 8.5.  Will check labs  today.- Plan: CBC with Differential/Platelet, COMPLETE METABOLIC PANEL WITH GFR.  Information immunization was placed in the AVS.  Primary osteoarthritis of both hands-she had no synovitis on examination.  Trochanteric bursitis of both hips-she continues to have pain and discomfort over bilateral trochanteric bursa.  IT band stretches handout was given.  Chondromalacia patellae, left knee-she has off-and-on discomfort.  Lower extremity muscle strengthening exercises were discussed.  Primary osteoarthritis of both feet-proper fitting shoes were advised.  Plantar fasciitis, bilateral-patient taken is having a recent flare of [place] fasciitis.  Chronic SI joint pain-she continues to have some SI joint discomfort most likely related to fibromyalgia and disc disease of lumbar spine.  DDD (degenerative disc disease), cervical-she had good range of motion of the cervical spine.  She had MRI of the cervical spine by Dr. Louanne Skye on December 20, 2021 which was reviewed with the patient.  It showed right eccentric disc bulge with spurring at C5-C6 and moderate right C6 foraminal stenosis.  Mild degenerative changes were noted at C3-C4 and C4-C5 without spinal stenosis.  Congenital fusion of C6-C7 vertebral bodies was noted.  I offered physical therapy with patient is unable to fit physical therapy in her busy schedule.  A handout on cervical spine exercises was given.  Other idiopathic scoliosis, thoracic region  DDD (degenerative disc disease), lumbar-she continues to have lower back pain.  She was evaluated by Dr. Louanne Skye and had MRI of the lumbar spine on May 2023 which showed transitional lumbosacral anatomy, mild lumbar disc and facet degeneration without spinal stenosis and mild left neuroforaminal stenosis at L4-5.  Findings were reviewed with the patient.  Patient declined physical therapy.  A handout on back exercises was given.  Myofascial pain-she is less pain and discomfort from myofascial pain.  Need  for regular exercise was emphasized.  Stretching was emphasized.  Hair loss - referred to dermatology at the last visit.  Patient has an appointment coming up with dermatology.  Plantar warts - Right great toe. referred to dermatology at the last visit.  Patient has an appointment with dermatology next month.  Hx of migraines  PCOS (polycystic ovarian syndrome)  Vitamin D deficiency-she takes vita D supplement.  Vitamin D was 65.14 on October 10, 2021.  Postoperative hypothyroidism  Family history of  rheumatoid arthritis  Orders: Orders Placed This Encounter  Procedures   CBC with Differential/Platelet   COMPLETE METABOLIC PANEL WITH GFR   No orders of the defined types were placed in this encounter.   Follow-Up Instructions: Return in about 5 months (around 01/28/2023) for Rheumatoid arthritis, Osteoarthritis.   Bo Merino, MD  Note - This record has been created using Editor, commissioning.  Chart creation errors have been sought, but may not always  have been located. Such creation errors do not reflect on  the standard of medical care.

## 2022-08-21 ENCOUNTER — Encounter: Payer: Self-pay | Admitting: Podiatry

## 2022-08-29 ENCOUNTER — Ambulatory Visit: Payer: 59 | Admitting: Podiatry

## 2022-08-29 ENCOUNTER — Ambulatory Visit: Payer: 59 | Attending: Rheumatology | Admitting: Rheumatology

## 2022-08-29 ENCOUNTER — Encounter: Payer: Self-pay | Admitting: Rheumatology

## 2022-08-29 VITALS — BP 124/88 | HR 87 | Resp 17 | Ht 63.0 in | Wt 212.6 lb

## 2022-08-29 DIAGNOSIS — M533 Sacrococcygeal disorders, not elsewhere classified: Secondary | ICD-10-CM

## 2022-08-29 DIAGNOSIS — M19041 Primary osteoarthritis, right hand: Secondary | ICD-10-CM

## 2022-08-29 DIAGNOSIS — E559 Vitamin D deficiency, unspecified: Secondary | ICD-10-CM

## 2022-08-29 DIAGNOSIS — M5030 Other cervical disc degeneration, unspecified cervical region: Secondary | ICD-10-CM | POA: Diagnosis not present

## 2022-08-29 DIAGNOSIS — G8929 Other chronic pain: Secondary | ICD-10-CM

## 2022-08-29 DIAGNOSIS — Z8261 Family history of arthritis: Secondary | ICD-10-CM

## 2022-08-29 DIAGNOSIS — Z8669 Personal history of other diseases of the nervous system and sense organs: Secondary | ICD-10-CM

## 2022-08-29 DIAGNOSIS — M19071 Primary osteoarthritis, right ankle and foot: Secondary | ICD-10-CM

## 2022-08-29 DIAGNOSIS — M4124 Other idiopathic scoliosis, thoracic region: Secondary | ICD-10-CM

## 2022-08-29 DIAGNOSIS — M722 Plantar fascial fibromatosis: Secondary | ICD-10-CM

## 2022-08-29 DIAGNOSIS — M5136 Other intervertebral disc degeneration, lumbar region: Secondary | ICD-10-CM

## 2022-08-29 DIAGNOSIS — Z79899 Other long term (current) drug therapy: Secondary | ICD-10-CM | POA: Diagnosis not present

## 2022-08-29 DIAGNOSIS — L659 Nonscarring hair loss, unspecified: Secondary | ICD-10-CM

## 2022-08-29 DIAGNOSIS — E890 Postprocedural hypothyroidism: Secondary | ICD-10-CM

## 2022-08-29 DIAGNOSIS — M2242 Chondromalacia patellae, left knee: Secondary | ICD-10-CM | POA: Diagnosis not present

## 2022-08-29 DIAGNOSIS — M7061 Trochanteric bursitis, right hip: Secondary | ICD-10-CM

## 2022-08-29 DIAGNOSIS — M19072 Primary osteoarthritis, left ankle and foot: Secondary | ICD-10-CM

## 2022-08-29 DIAGNOSIS — M059 Rheumatoid arthritis with rheumatoid factor, unspecified: Secondary | ICD-10-CM

## 2022-08-29 DIAGNOSIS — E282 Polycystic ovarian syndrome: Secondary | ICD-10-CM

## 2022-08-29 DIAGNOSIS — M7062 Trochanteric bursitis, left hip: Secondary | ICD-10-CM

## 2022-08-29 DIAGNOSIS — M19042 Primary osteoarthritis, left hand: Secondary | ICD-10-CM

## 2022-08-29 DIAGNOSIS — M7918 Myalgia, other site: Secondary | ICD-10-CM

## 2022-08-29 DIAGNOSIS — B070 Plantar wart: Secondary | ICD-10-CM

## 2022-08-29 NOTE — Patient Instructions (Signed)
Vaccines You are taking a medication(s) that can suppress your immune system.  The following immunizations are recommended: Flu annually Covid-19  Td/Tdap (tetanus, diphtheria, pertussis) every 10 years Pneumonia (Prevnar 15 then Pneumovax 23 at least 1 year apart.  Alternatively, can take Prevnar 20 without needing additional dose) Shingrix: 2 doses from 4 weeks to 6 months apart  Please check with your PCP to make sure you are up to date.   Cervical Strain and Sprain Rehab Ask your health care provider which exercises are safe for you. Do exercises exactly as told by your health care provider and adjust them as directed. It is normal to feel mild stretching, pulling, tightness, or discomfort as you do these exercises. Stop right away if you feel sudden pain or your pain gets worse. Do not begin these exercises until told by your health care provider. Stretching and range-of-motion exercises Cervical side bending  Using good posture, sit on a stable chair or stand up. Without moving your shoulders, slowly tilt your left / right ear to your shoulder until you feel a stretch in the neck muscles on the opposite side. You should be looking straight ahead. Hold for __________ seconds. Repeat with the other side of your neck. Repeat __________ times. Complete this exercise __________ times a day. Cervical rotation  Using good posture, sit on a stable chair or stand up. Slowly turn your head to the side as if you are looking over your left / right shoulder. Keep your eyes level with the ground. Stop when you feel a stretch along the side and the back of your neck. Hold for __________ seconds. Repeat this by turning to your other side. Repeat __________ times. Complete this exercise __________ times a day. Thoracic extension and pectoral stretch  Roll a towel or a small blanket so it is about 4 inches (10 cm) in diameter. Lie down on your back on a firm surface. Put the towel in the middle  of your back across your spine. It should not be under your shoulder blades. Put your hands behind your head and let your elbows fall out to your sides. Hold for __________ seconds. Repeat __________ times. Complete this exercise __________ times a day. Strengthening exercises Upper cervical flexion  Lie on your back with a thin pillow behind your head or a small, rolled-up towel under your neck. Gently tuck your chin toward your chest and nod your head down to look toward your feet. Do not lift your head off the pillow. Hold for __________ seconds. Release the tension slowly. Relax your neck muscles completely before you repeat this exercise. Repeat __________ times. Complete this exercise __________ times a day. Cervical extension  Stand about 6 inches (15 cm) away from a wall, with your back facing the wall. Place a soft object, about 6-8 inches (15-20 cm) in diameter, between the back of your head and the wall. A soft object could be a small pillow, a ball, or a folded towel. Gently tilt your head back and press into the soft object. Keep your jaw and forehead relaxed. Hold for __________ seconds. Release the tension slowly. Relax your neck muscles completely before you repeat this exercise. Repeat __________ times. Complete this exercise __________ times a day. Posture and body mechanics Body mechanics refer to the movements and positions of your body while you do your daily activities. Posture is part of body mechanics. Good posture and healthy body mechanics can help to relieve stress in your body's tissues and joints.  Good posture means that your spine is in its natural S-curve position (your spine is neutral), your shoulders are pulled back slightly, and your head is not tipped forward. The following are general guidelines for using improved posture and body mechanics in your everyday activities. Sitting  When sitting, keep your spine neutral and keep your feet flat on the floor.  Use a footrest, if needed, and keep your thighs parallel to the floor. Avoid rounding your shoulders. Avoid tilting your head forward. When working at a desk or a computer, keep your desk at a height where your hands are slightly lower than your elbows. Slide your chair under your desk so you are close enough to maintain good posture. When working at a computer, place your monitor at a height where you are looking straight ahead and you do not have to tilt your head forward or downward to look at the screen. Standing  When standing, keep your spine neutral and keep your feet about hip-width apart. Keep a slight bend in your knees. Your ears, shoulders, and hips should line up. When you do a task in which you stand in one place for a long time, place one foot up on a stable object that is 2-4 inches (5-10 cm) high, such as a footstool. This helps keep your spine neutral. Resting When lying down and resting, avoid positions that are most painful for you. Try to support your neck in a neutral position. You can use a contour pillow or a small rolled-up towel. Your pillow should support your neck but not push on it. This information is not intended to replace advice given to you by your health care provider. Make sure you discuss any questions you have with your health care provider. Document Revised: 03/19/2022 Document Reviewed: 03/19/2022 Elsevier Patient Education  [place] Band Syndrome Rehab Ask your health care provider which exercises are safe for you. Do exercises exactly as told by your health care provider and adjust them as directed. It is normal to feel mild stretching, pulling, tightness, or discomfort as you do these exercises. Stop right away if you feel sudden pain or your pain gets significantly worse. Do not begin these exercises until told by your health care provider. Stretching and range-of-motion exercises These exercises warm up your muscles and joints and  improve the movement and flexibility of your hip and pelvis. Quadriceps stretch, prone  Lie on your abdomen (prone position) on a firm surface, such as a bed or padded floor. Bend your left / right knee and reach back to hold your ankle or pant leg. If you cannot reach your ankle or pant leg, loop a belt around your foot and grab the belt instead. Gently pull your heel toward your buttocks. Your knee should not slide out to the side. You should feel a stretch in the front of your thigh and knee (quadriceps). Hold this position for __________ seconds. Repeat __________ times. Complete this exercise __________ times a day. Iliotibial band stretch An iliotibial band is a strong band of muscle tissue that runs from the outer side of your hip to the outer side of your thigh and knee. Lie on your side with your left / right leg in the top position. Bend both of your knees and grab your left / right ankle. Stretch out your bottom arm to help you balance. Slowly bring your top knee back so your thigh goes behind your trunk. Slowly lower your top leg toward the  floor until you feel a gentle stretch on the outside of your left / right hip and thigh. If you do not feel a stretch and your knee will not fall farther, place the heel of your other foot on top of your knee and pull your knee down toward the floor with your foot. Hold this position for __________ seconds. Repeat __________ times. Complete this exercise __________ times a day. Strengthening exercises These exercises build strength and endurance in your hip and pelvis. Endurance is the ability to use your muscles for a long time, even after they get tired. Straight leg raises, side-lying This exercise strengthens the muscles that rotate the leg at the hip and move it away from your body (hip abductors). Lie on your side with your left / right leg in the top position. Lie so your head, shoulder, hip, and knee line up. You may bend your bottom knee  to help you balance. Roll your hips slightly forward so your hips are stacked directly over each other and your left / right knee is facing forward. Tense the muscles in your outer thigh and lift your top leg 4-6 inches (10-15 cm). Hold this position for __________ seconds. Slowly lower your leg to return to the starting position. Let your muscles relax completely before doing another repetition. Repeat __________ times. Complete this exercise __________ times a day. Leg raises, prone This exercise strengthens the muscles that move the hips backward (hip extensors). Lie on your abdomen (prone position) on your bed or a firm surface. You can put a pillow under your hips if that is more comfortable for your lower back. Bend your left / right knee so your foot is straight up in the air. Squeeze your buttocks muscles and lift your left / right thigh off the bed. Do not let your back arch. Tense your thigh muscle as hard as you can without increasing any knee pain. Hold this position for __________ seconds. Slowly lower your leg to return to the starting position and allow it to relax completely. Repeat __________ times. Complete this exercise __________ times a day. Hip hike Stand sideways on a bottom step. Stand on your left / right leg with your other foot unsupported next to the step. You can hold on to a railing or wall for balance if needed. Keep your knees straight and your torso square. Then lift your left / right hip up toward the ceiling. Slowly let your left / right hip lower toward the floor, past the starting position. Your foot should get closer to the floor. Do not lean or bend your knees. Repeat __________ times. Complete this exercise __________ times a day. This information is not intended to replace advice given to you by your health care provider. Make sure you discuss any questions you have with your health care provider. Document Revised: 11/04/2019 Document Reviewed:  11/04/2019 Elsevier Patient Education  [place].  Back Exercises The following exercises strengthen the muscles that help to support the trunk (torso) and back. They also help to keep the lower back flexible. Doing these exercises can help to prevent or lessen existing low back pain. If you have back pain or discomfort, try doing these exercises 2-3 times each day or as told by your health care provider. As your pain improves, do them once each day, but increase the number of times that you repeat the steps for each exercise (do more repetitions). To prevent the recurrence of back pain, continue to do these  exercises once each day or as told by your health care provider. Do exercises exactly as told by your health care provider and adjust them as directed. It is normal to feel mild stretching, pulling, tightness, or discomfort as you do these exercises, but you should stop right away if you feel sudden pain or your pain gets worse. Exercises Single knee to chest Repeat these steps 3-5 times for each leg: Lie on your back on a firm bed or the floor with your legs extended. Bring one knee to your chest. Your other leg should stay extended and in contact with the floor. Hold your knee in place by grabbing your knee or thigh with both hands and hold. Pull on your knee until you feel a gentle stretch in your lower back or buttocks. Hold the stretch for 10-30 seconds. Slowly release and straighten your leg.  Pelvic tilt Repeat these steps 5-10 times: Lie on your back on a firm bed or the floor with your legs extended. Bend your knees so they are pointing toward the ceiling and your feet are flat on the floor. Tighten your lower abdominal muscles to press your lower back against the floor. This motion will tilt your pelvis so your tailbone points up toward the ceiling instead of pointing to your feet or the floor. With gentle tension and even breathing, hold this position for 5-10  seconds.  Cat-cow Repeat these steps until your lower back becomes more flexible: Get into a hands-and-knees position on a firm bed or the floor. Keep your hands under your shoulders, and keep your knees under your hips. You may place padding under your knees for comfort. Let your head hang down toward your chest. Contract your abdominal muscles and point your tailbone toward the floor so your lower back becomes rounded like the back of a cat. Hold this position for 5 seconds. Slowly lift your head, let your abdominal muscles relax, and point your tailbone up toward the ceiling so your back forms a sagging arch like the back of a cow. Hold this position for 5 seconds.  Press-ups Repeat these steps 5-10 times: Lie on your abdomen (face-down) on a firm bed or the floor. Place your palms near your head, about shoulder-width apart. Keeping your back as relaxed as possible and keeping your hips on the floor, slowly straighten your arms to raise the top half of your body and lift your shoulders. Do not use your back muscles to raise your upper torso. You may adjust the placement of your hands to make yourself more comfortable. Hold this position for 5 seconds while you keep your back relaxed. Slowly return to lying flat on the floor.  Bridges Repeat these steps 10 times: Lie on your back on a firm bed or the floor. Bend your knees so they are pointing toward the ceiling and your feet are flat on the floor. Your arms should be flat at your sides, next to your body. Tighten your buttocks muscles and lift your buttocks off the floor until your waist is at almost the same height as your knees. You should feel the muscles working in your buttocks and the back of your thighs. If you do not feel these muscles, slide your feet 1-2 inches (2.5-5 cm) farther away from your buttocks. Hold this position for 3-5 seconds. Slowly lower your hips to the starting position, and allow your buttocks muscles to relax  completely. If this exercise is too easy, try doing it with your arms  crossed over your chest. Abdominal crunches Repeat these steps 5-10 times: Lie on your back on a firm bed or the floor with your legs extended. Bend your knees so they are pointing toward the ceiling and your feet are flat on the floor. Cross your arms over your chest. Tip your chin slightly toward your chest without bending your neck. Tighten your abdominal muscles and slowly raise your torso high enough to lift your shoulder blades a tiny bit off the floor. Avoid raising your torso higher than that because it can put too much stress on your lower back and does not help to strengthen your abdominal muscles. Slowly return to your starting position.  Back lifts Repeat these steps 5-10 times: Lie on your abdomen (face-down) with your arms at your sides, and rest your forehead on the floor. Tighten the muscles in your legs and your buttocks. Slowly lift your chest off the floor while you keep your hips pressed to the floor. Keep the back of your head in line with the curve in your back. Your eyes should be looking at the floor. Hold this position for 3-5 seconds. Slowly return to your starting position.  Contact a health care provider if: Your back pain or discomfort gets much worse when you do an exercise. Your worsening back pain or discomfort does not lessen within 2 hours after you exercise. If you have any of these problems, stop doing these exercises right away. Do not do them again unless your health care provider says that you can. Get help right away if: You develop sudden, severe back pain. If this happens, stop doing the exercises right away. Do not do them again unless your health care provider says that you can. This information is not intended to replace advice given to you by your health care provider. Make sure you discuss any questions you have with your health care provider. Document Revised: 02/21/2021  Document Reviewed: 11/09/2020 Elsevier Patient Education  [place].

## 2022-08-30 LAB — CBC WITH DIFFERENTIAL/PLATELET
Absolute Monocytes: 952 cells/uL — ABNORMAL HIGH (ref 200–950)
Basophils Absolute: 42 cells/uL (ref 0–200)
Basophils Relative: 0.6 %
Eosinophils Absolute: 98 cells/uL (ref 15–500)
Eosinophils Relative: 1.4 %
HCT: 37.9 % (ref 35.0–45.0)
Hemoglobin: 12.9 g/dL (ref 11.7–15.5)
Lymphs Abs: 2100 cells/uL (ref 850–3900)
MCH: 31.2 pg (ref 27.0–33.0)
MCHC: 34 g/dL (ref 32.0–36.0)
MCV: 91.8 fL (ref 80.0–100.0)
MPV: 9.9 fL (ref 7.5–12.5)
Monocytes Relative: 13.6 %
Neutro Abs: 3808 cells/uL (ref 1500–7800)
Neutrophils Relative %: 54.4 %
Platelets: 330 10*3/uL (ref 140–400)
RBC: 4.13 10*6/uL (ref 3.80–5.10)
RDW: 12.2 % (ref 11.0–15.0)
Total Lymphocyte: 30 %
WBC: 7 10*3/uL (ref 3.8–10.8)

## 2022-08-30 LAB — COMPLETE METABOLIC PANEL WITH GFR
AG Ratio: 1.5 (calc) (ref 1.0–2.5)
ALT: 11 U/L (ref 6–29)
AST: 14 U/L (ref 10–30)
Albumin: 3.8 g/dL (ref 3.6–5.1)
Alkaline phosphatase (APISO): 47 U/L (ref 31–125)
BUN: 13 mg/dL (ref 7–25)
CO2: 28 mmol/L (ref 20–32)
Calcium: 9 mg/dL (ref 8.6–10.2)
Chloride: 107 mmol/L (ref 98–110)
Creat: 0.96 mg/dL (ref 0.50–0.99)
Globulin: 2.5 g/dL (calc) (ref 1.9–3.7)
Glucose, Bld: 83 mg/dL (ref 65–99)
Potassium: 4.2 mmol/L (ref 3.5–5.3)
Sodium: 141 mmol/L (ref 135–146)
Total Bilirubin: 0.3 mg/dL (ref 0.2–1.2)
Total Protein: 6.3 g/dL (ref 6.1–8.1)
eGFR: 75 mL/min/{1.73_m2} (ref >=60)

## 2022-08-30 NOTE — Progress Notes (Signed)
CBC and CMP normal

## 2022-09-07 ENCOUNTER — Ambulatory Visit
Admission: RE | Admit: 2022-09-07 | Discharge: 2022-09-07 | Disposition: A | Discharge status: Home / Self Care | Payer: 59 | Source: Ambulatory Visit | Attending: Family Medicine | Admitting: Family Medicine

## 2022-09-07 ENCOUNTER — Other Ambulatory Visit: Payer: Self-pay | Admitting: Family Medicine

## 2022-09-07 DIAGNOSIS — R0989 Other specified symptoms and signs involving the circulatory and respiratory systems: Secondary | ICD-10-CM

## 2022-09-07 DIAGNOSIS — R059 Cough, unspecified: Secondary | ICD-10-CM | POA: Diagnosis not present

## 2022-09-07 DIAGNOSIS — M069 Rheumatoid arthritis, unspecified: Secondary | ICD-10-CM | POA: Diagnosis not present

## 2022-09-18 ENCOUNTER — Other Ambulatory Visit (HOSPITAL_COMMUNITY): Payer: Self-pay

## 2022-09-18 DIAGNOSIS — L650 Telogen effluvium: Secondary | ICD-10-CM | POA: Diagnosis not present

## 2022-09-18 MED ORDER — SPIRONOLACTONE 25 MG PO TABS
ORAL_TABLET | ORAL | 5 refills | Status: DC
  Filled 2022-09-18: qty 60, 30d supply, fill #0
  Filled 2022-10-23: qty 60, 30d supply, fill #1
  Filled 2023-01-01 – 2023-01-11 (×2): qty 60, 30d supply, fill #2
  Filled 2023-02-08: qty 60, 30d supply, fill #3
  Filled 2023-04-10: qty 60, 30d supply, fill #4

## 2022-10-12 ENCOUNTER — Ambulatory Visit: Payer: 59 | Admitting: Internal Medicine

## 2022-10-19 ENCOUNTER — Other Ambulatory Visit (HOSPITAL_COMMUNITY): Payer: Self-pay

## 2022-10-24 ENCOUNTER — Encounter: Payer: Self-pay | Admitting: Internal Medicine

## 2022-10-24 ENCOUNTER — Other Ambulatory Visit (HOSPITAL_COMMUNITY): Payer: Self-pay

## 2022-10-24 ENCOUNTER — Other Ambulatory Visit: Payer: Self-pay | Admitting: Physician Assistant

## 2022-10-24 ENCOUNTER — Ambulatory Visit: Payer: Commercial Managed Care - PPO | Admitting: Internal Medicine

## 2022-10-24 VITALS — BP 120/76 | HR 91 | Ht 63.0 in | Wt 209.2 lb

## 2022-10-24 DIAGNOSIS — E890 Postprocedural hypothyroidism: Secondary | ICD-10-CM

## 2022-10-24 DIAGNOSIS — E282 Polycystic ovarian syndrome: Secondary | ICD-10-CM | POA: Diagnosis not present

## 2022-10-24 DIAGNOSIS — E538 Deficiency of other specified B group vitamins: Secondary | ICD-10-CM

## 2022-10-24 DIAGNOSIS — E559 Vitamin D deficiency, unspecified: Secondary | ICD-10-CM | POA: Diagnosis not present

## 2022-10-24 LAB — VITAMIN B12: Vitamin B-12: 299 pg/mL (ref 211–911)

## 2022-10-24 LAB — LIPID PANEL
Cholesterol: 178 mg/dL (ref 0–200)
HDL: 55.1 mg/dL (ref >39.00)
LDL Cholesterol: 97 mg/dL (ref 0–99)
NonHDL: 122.53
Total CHOL/HDL Ratio: 3
Triglycerides: 128 mg/dL (ref 0.0–149.0)
VLDL: 25.6 mg/dL (ref 0.0–40.0)

## 2022-10-24 LAB — VITAMIN D 25 HYDROXY (VIT D DEFICIENCY, FRACTURES): VITD: 78.83 ng/mL (ref 30.00–100.00)

## 2022-10-24 LAB — HEMOGLOBIN A1C: Hgb A1c MFr Bld: 5.2 % (ref 4.6–6.5)

## 2022-10-24 LAB — TSH: TSH: 6.58 u[IU]/mL — ABNORMAL HIGH (ref 0.35–5.50)

## 2022-10-24 LAB — T4, FREE: Free T4: 0.66 ng/dL (ref 0.60–1.60)

## 2022-10-24 LAB — T3, FREE: T3, Free: 3.5 pg/mL (ref 2.3–4.2)

## 2022-10-24 MED ORDER — HYDROXYCHLOROQUINE SULFATE 200 MG PO TABS
200.0000 mg | ORAL_TABLET | Freq: Two times a day (BID) | ORAL | 0 refills | Status: DC
  Filled 2022-10-24: qty 120, 84d supply, fill #0

## 2022-10-24 NOTE — Progress Notes (Signed)
Patient ID: Taylor Bennett, female   DOB: [birth year], 42 y.o.   MRN: [id number]   HPI  Taylor Bennett is a 42 y.o.-year-old female, initially referred by her PCP, Redmon, Noelle, PA, returning for follow-up for postsurgical hypothyroidism and hypoparathyroidism and vitamin D deficiency, low vitamin B12. She previously saw Dr. Dwyane Dee.  Last visit with me 1 year ago.  Interim history: She sees Dr.  Estanislado Pandy for seropositive RA. On PLaquenil. She has occasional swelling and stiffness in joints. Also bone pain. She has dysfunctional uterine bleeding. In 04/2021, per insurance preference, she had to change from Armour Thyroid to NP thyroid.  She had nausea and gagging with the NP thyroid >> we could not change back to Armour thyroid. She has hair loss and fatigue. She also has AP >> plans to see GI.  Postsurgical hypoparathyroidism-mild intermittent: Reviewed calcium and PTH levels: Lab Results  Component Value Date   PTH 12 (L) 04/01/2020   PTH Comment 04/01/2020   PTH CANCELED 10/02/2019   PTH Comment 10/02/2019   PTH 9 (L) 05/08/2019   CALCIUM 9.0 08/29/2022   CALCIUM 8.5 (L) 03/22/2022   CALCIUM 9.1 09/20/2021   CALCIUM 8.8 05/01/2021   CALCIUM 8.9 12/08/2020   CALCIUM 9.6 07/27/2020   CALCIUM 9.2 06/06/2020   CALCIUM 9.1 04/01/2020   CALCIUM 8.9 10/02/2019   CALCIUM 9.5 05/08/2019  05/29/2019: Calcium 8.2 (8.7-10.2)  Magnesium and phosphorus levels were normal: Lab Results  Component Value Date   PHOS 3.2 05/08/2019   Lab Results  Component Value Date   MG 1.7 05/08/2019   Postsurgical hypothyroidism: Reviewed history: Pt. has a h/o Hashimoto's thyroiditis and now has postsurgical hypothyroidism after her total thyroidectomy for neck compression symptoms and a suspicious nodule on 12/19/2015 (final pathology >> benign). Surgery was performed by Dr. Celine Ahr.   She could not tolerate Synthroid due to significant fatigue.  She was on Armour Thyroid 90 mg 5/7 days and 60  mg  2/7 days >> NP 90 mg daily: - in am - fasting - at least 30 min from b'fast - no calcium - no iron - stopped multivitamins - no PPIs - not on Biotin  Reviewed her TFTs: Lab Results  Component Value Date   TSH 0.66 10/10/2021   TSH 2.10 03/20/2021   TSH 9.61 (H) 09/23/2020   TSH 9.560 (H) 09/23/2020   TSH 5.59 (H) 04/01/2020   TSH 2.320 10/02/2019   TSH 2.42 09/26/2018   TSH 2.49 07/25/2018   TSH 5.340 (H) 05/02/2018   TSH 2.770 10/10/2017   FREET4 0.80 10/10/2021   FREET4 0.73 03/20/2021   FREET4 0.60 09/23/2020   FREET4 0.78 (L) 09/23/2020   FREET4 0.60 04/01/2020   FREET4 0.78 (L) 10/02/2019   FREET4 0.70 09/26/2018   FREET4 0.69 07/25/2018   FREET4 0.80 (L) 05/02/2018   FREET4 0.89 10/10/2017  05/29/2019: TSH 2.48 05/06/2019: TSH 2.28 01/2016: TSH 2.6  Pt denies: - feeling nodules in neck - hoarseness - choking - SOB with lying down  She has + FH of thyroid disorders in: mother. No FH of thyroid cancer. No h/o radiation tx to head or neck. No herbal supplements. No Biotin use. No recent steroids use.   PCOS: -She was on metformin- but stopped 2/2 diarrhea in 07/2017 >> she restarted this in 09/2017 but stopped as she did not like how it made her feel -She was on NuvaRing (per Dr. Landry Mellow) but now off for more than 3 years -Restarted Ovasitol 09/2017 but  did not continue it -Menses are regular -No hirsutism or acne, but continues to have hair loss  HbA1c levels were normal: Lab Results  Component Value Date   HGBA1C 5.2 10/10/2021   HGBA1C 5.2 09/23/2020   HGBA1C CANCELED 09/23/2020   HGBA1C 5.2 10/02/2019   HGBA1C 5.5 09/26/2018   Vitamin D deficiency:  Vitamin D levels were reviewed: Lab Results  Component Value Date   VD25OH 65.14 10/10/2021   VD25OH 51.3 09/23/2020   VD25OH 83.9 04/01/2020   VD25OH 87.3 10/02/2019   VD25OH 40.72 05/08/2019   VD25OH 32.22 09/26/2018   VD25OH 24.33 (L) 07/25/2018   VD25OH 50.7 09/26/2017   VD25OH 49.42  02/14/2017   VD25OH 24.47 (L) 10/23/2016   She continues on 5000 units x 2 vitamin D3 daily. Also on vitamin C and Zinc.  Low vitamin B12:  Reviewed vitamin B12 levels: Lab Results  Component Value Date   [id number] 10/10/2021   VITAMINB12 443 09/23/2020   VITAMINB12 628 09/23/2020   VITAMINB12 503 04/01/2020   VITAMINB12 863 10/02/2019   VITAMINB12 310 09/26/2018   VITAMINB12 550 09/26/2017   She was on a multivitamin with B12 >> now on 1000 mcg B12 >> off now.  She also had a severe vertigo episode in summer 2018 >> on Meclizine.  She also has DDD in cervical spine. Sees Dr. Louanne Skye.  She has a family history of RA.  ROS: + See HPI  + joint aches and swelling  I reviewed pt's medications, allergies, PMH, social hx, family hx, and changes were documented in the history of present illness. Otherwise, unchanged from my initial visit note.  Past Medical History:  Diagnosis Date   Back pain    Cervical pain    Cervical spondylolysis    DDD (degenerative disc disease), lumbar    History of IBS    per patient   PCOS (polycystic ovarian syndrome)    Rheumatoid arthritis ([place])    Scoliosis    Thyroid disease    Past Surgical History:  Procedure Laterality Date   ANKLE SURGERY  2009   ORIF    THYROIDECTOMY     WISDOM TOOTH EXTRACTION     Social History   Social History   Marital status: Single    Spouse name: N/A   Number of children: N/A   Occupational History   Not on file.   Social History Main Topics   Smoking status: Never Smoker   Smokeless tobacco: Never Used   Alcohol use No   Drug use: No   Current Outpatient Medications on File Prior to Visit  Medication Sig Dispense Refill   Ascorbic Acid (VITAMIN C PO) Take by mouth daily.     Cholecalciferol (VITAMIN D PO) Take 5,000 Units by mouth 2 (two) times daily.      diclofenac Sodium (VOLTAREN) 1 % GEL Apply 2 g topically 4 (four) times daily. (Patient not taking: Reported on 08/29/2022) 300 g 2    dicyclomine (BENTYL) 10 MG capsule Take 1 capsule (10 mg total) by mouth 3 (three) times daily as needed for abdominal pain 90 capsule 3   etonogestrel-ethinyl estradiol (NUVARING) 0.12-0.015 MG/24HR vaginal ring Insert 1 ring vaginally,leave in place for 3 weeks ,remove and replace every 21 days 3 each 4   hydroxychloroquine (PLAQUENIL) 200 MG tablet Take 1 tablet (200 mg total) by mouth 2 (two) times daily from Monday to Friday. 120 tablet 0   ibuprofen (ADVIL,MOTRIN) 200 MG tablet Takes 649m - 8047mdaily,  as needed.     loratadine (CLARITIN) 10 MG tablet Take 10 mg by mouth daily.     meclizine (ANTIVERT) 25 MG tablet Take 1 tablet (25 mg total) by mouth 3 (three) times daily as needed for dizziness. 20 tablet 0   Metaxalone (SKELAXIN PO) Take by mouth as needed.     Multiple Vitamin (MULTIVITAMIN PO) Take by mouth daily. (Patient not taking: Reported on 08/29/2022)     rizatriptan (MAXALT) 10 MG tablet Take 1 tablet by mouth at the onset of headache. May repeat in 2 hours if needed. (max 2 tabs in 24 hours) 6 tablet 3   spironolactone (ALDACTONE) 25 MG tablet Take 1 tablet by mouth daily for 2 weeks, then increase to 2 tablets daily 60 tablet 5   thyroid (NP THYROID) 90 MG tablet Take 1 tablet (90 mg total) by mouth daily. 90 tablet 3   ZINC CITRATE-PHYTASE PO Take by mouth.     No current facility-administered medications on file prior to visit.   No Known Allergies Family History  Problem Relation Age of Onset   Thyroid disease Mother    Arthritis Mother    Diabetes Father    Congestive Heart Failure Father    Alcoholism Father    Neuropathy Father    Arthritis Father    Osteoarthritis Maternal Aunt    Rheum arthritis Paternal Grandmother    Healthy Daughter    Polycystic ovary syndrome Daughter    PE: BP 120/76 (BP Location: Left Arm, Patient Position: Sitting, Cuff Size: Normal)   Pulse 91   Ht 5' 3"$  (1.6 m)   Wt 209 lb 3.2 oz (94.9 kg)   SpO2 98%   BMI 37.06 kg/m  Wt  Readings from Last 3 Encounters:  10/24/22 209 lb 3.2 oz (94.9 kg)  08/29/22 212 lb 9.6 oz (96.4 kg)  03/22/22 209 lb 9.6 oz (95.1 kg)   Constitutional: overweight, in NAD Eyes:  EOMI, no exophthalmos ENT: no neck masses, no cervical lymphadenopathy Cardiovascular: RRR, No MRG Respiratory: CTA B Musculoskeletal: no deformities Skin:no rashes Neurological: no tremor with outstretched hands  ASSESSMENT: 1. Postsurgical Hypothyroidism  2. Vitamin D deficiency  3.  Low vitamin B12  4.  PCOS  5.  Hypocalcemia due to postsurgical hypoparathyroidism  PLAN:  1. Patient with longstanding hypothyroidism, after total thyroidectomy.  She could not tolerate levothyroxine-we switched to Armour Thyroid, on which she did very well, however, in 04/2021, her insurance stopped covering Armour so we started NP thyroid.  At last visit she had nausea and gagging when taking the tablet.  Tried again to start Armour since last visit but her insurance did not cover it.  She continues on the NP thyroid. - latest thyroid labs reviewed with pt. >> normal: Lab Results  Component Value Date   TSH 0.66 10/10/2021  - she continues on NP thyroid 90 mg daily, but I am hoping that her new insurance covers the Armour so we can switch to this, since she still has nausea with the NP thyroid tablet - she has fatigue, hair loss, and joint aches, but these are longstanding - we discussed about taking the thyroid hormone every day, with water, >30 minutes before breakfast, separated by >4 hours from acid reflux medications, calcium, iron, multivitamins. Pt. is taking it correctly. - will check thyroid tests today: TSH, fT3, and fT4 - If labs are abnormal, she will need to return for repeat TFTs in 1.5 months  2. Vitamin D deficiency -She  continues on 10,000 units vitamin D daily -At last visit, vitamin D level was at goal -We will repeat the level today  3. Low vitamin B12 -Diagnosed during investigation for  fatigue -She was on a multivitamin before, on B12 1000 mcg daily at last visit, but she mentions that she ran out of this many months ago. -At last visit, B12 level was 628, at goal -We will recheck her B12 level today   4.  PCOS -She has regular menstrual cycles -Previously on of Ovasitol and metformin, now off -Latest HbA1c was normal: Lab Results  Component Value Date   HGBA1C 5.2 10/10/2021  -We will repeat her HbA1c today -Latest lipid panel was normal: Lab Results  Component Value Date   CHOL 162 10/10/2021   HDL 46.50 10/10/2021   LDLCALC 93 10/10/2021   TRIG 113.0 10/10/2021   CHOLHDL 3 10/10/2021  -We will repeat her lipid panel today  5.  Hypocalcemia -Possibly developed after her thyroid surgery but also possibly due to vitamin D deficiency -Calcium level was normal at last visit, as well as her vitamin D: Lab Results  Component Value Date   CALCIUM 9.0 08/29/2022   PHOS 3.2 05/08/2019   Lab Results  Component Value Date   VD25OH 65.14 10/10/2021  - we we will repeat her vitamin D level today - No perioral numbness, tingling, or muscle cramping.  She has joint pains, possibly related to her RA.  Needs refills for Armour.  Component     Latest Ref Rng 10/24/2022  T4,Free(Direct)     0.60 - 1.60 ng/dL 0.66   TSH     0.35 - 5.50 uIU/mL 6.58 (H)   Triiodothyronine,Free,Serum     2.3 - 4.2 pg/mL 3.5   VITD     30.00 - 100.00 ng/mL 78.83   Vitamin B12     211 - 911 pg/mL 299   Cholesterol     0 - 200 mg/dL 178   Triglycerides     0.0 - 149.0 mg/dL 128.0   HDL Cholesterol     >39.00 mg/dL 55.10   VLDL     0.0 - 40.0 mg/dL 25.6   LDL (calc)     0 - 99 mg/dL 97   Total CHOL/HDL Ratio 3   NonHDL 122.53   Hemoglobin A1C     4.6 - 6.5 % 5.2     TSH is higher than target, however, I would like to stay with the same dose of desiccated thyroid extract and try to switch to Armour. Plan to recheck TFTs in 1.5 mo and may need to adjust the dose then. The  rest of the labs are at goal.  Philemon Kingdom, MD PhD [facility] Endocr[facility]

## 2022-10-24 NOTE — Telephone Encounter (Signed)
Next Visit: 01/29/2023  Last Visit: 08/29/2022  Labs: 08/29/2022 CBC and CMP normal.   Eye exam: 01/16/2022   Current Dose per office note 08/29/2022: Plaquenil 200 mg 1 tablet by mouth twice daily monday through friday   [id number] rheumatoid arthritis   Last Fill: 07/16/2022  Okay to refill Plaquenil?

## 2022-10-24 NOTE — Patient Instructions (Signed)
Please stop at the lab.  Please continue Armour 90 mg daily.  Take the thyroid hormone every day, with water, at least 30 minutes before breakfast, separated by at least 4 hours from: - acid reflux medications - calcium - iron - multivitamins  Please come back for a follow-up appointment in 1 year.

## 2022-10-26 ENCOUNTER — Other Ambulatory Visit (HOSPITAL_COMMUNITY): Payer: Self-pay

## 2022-10-26 ENCOUNTER — Other Ambulatory Visit: Payer: Self-pay | Admitting: Internal Medicine

## 2022-10-26 DIAGNOSIS — E890 Postprocedural hypothyroidism: Secondary | ICD-10-CM

## 2022-10-26 MED ORDER — ARMOUR THYROID 90 MG PO TABS
90.0000 mg | ORAL_TABLET | Freq: Every day | ORAL | 3 refills | Status: DC
  Filled 2022-10-26: qty 30, 30d supply, fill #0

## 2022-10-28 ENCOUNTER — Encounter: Payer: Self-pay | Admitting: Internal Medicine

## 2022-10-28 DIAGNOSIS — E890 Postprocedural hypothyroidism: Secondary | ICD-10-CM

## 2022-10-29 ENCOUNTER — Other Ambulatory Visit (HOSPITAL_COMMUNITY): Payer: Self-pay

## 2022-10-29 MED ORDER — THYROID 90 MG PO TABS
90.0000 mg | ORAL_TABLET | Freq: Every day | ORAL | 1 refills | Status: DC
  Filled 2022-10-29: qty 90, 90d supply, fill #0
  Filled 2022-12-04: qty 90, 90d supply, fill #1

## 2022-10-30 ENCOUNTER — Other Ambulatory Visit: Payer: Self-pay | Admitting: Internal Medicine

## 2022-10-31 ENCOUNTER — Encounter: Payer: Self-pay | Admitting: Orthopaedic Surgery

## 2022-10-31 ENCOUNTER — Ambulatory Visit: Payer: Commercial Managed Care - PPO | Admitting: Orthopaedic Surgery

## 2022-10-31 VITALS — BP 137/84 | HR 79 | Ht 63.0 in | Wt 205.0 lb

## 2022-10-31 DIAGNOSIS — M25531 Pain in right wrist: Secondary | ICD-10-CM | POA: Diagnosis not present

## 2022-10-31 DIAGNOSIS — M19042 Primary osteoarthritis, left hand: Secondary | ICD-10-CM

## 2022-10-31 DIAGNOSIS — M059 Rheumatoid arthritis with rheumatoid factor, unspecified: Secondary | ICD-10-CM

## 2022-10-31 DIAGNOSIS — M19041 Primary osteoarthritis, right hand: Secondary | ICD-10-CM

## 2022-10-31 NOTE — Progress Notes (Signed)
Office Visit Note   Patient: Taylor Bennett           Date of Birth: 1981/03/09           MRN: [id number] Visit Date: 10/31/2022              Requested by: Lennie Odor, PA [address] [place],  [place] [address] PCP: Lennie Odor, PA   Assessment & Plan: Visit Diagnoses:  1. Pain in right wrist   2. Seropositive rheumatoid arthritis ([place])   3. Primary osteoarthritis of both hands     Plan: Will place patient in a wrist splint to rest her wrist she can remove it for bathing and washing her hand.  She has continued problems she can return for cortisone wrist injection.  She states she will call if she has ongoing problems.  Work note given patient seen today in office.  Follow-Up Instructions: Return if symptoms worsen or fail to improve.   Orders:  No orders of the defined types were placed in this encounter.  No orders of the defined types were placed in this encounter.     Procedures: No procedures performed   Clinical Data: No additional findings.   Subjective: Chief Complaint  Patient presents with   Right Wrist - Pain    HPI 42 year old female with seropositive rheumatoid arthritis patient had problems with wrist pain she is right-hand dominant.  She woke up with pain and has had pain with gripping problems holding the steering well water bottles.  She has had electrical test 6 years ago 2018 which was negative for cervical radiculopathy.  Plain radiograph showed some mild narrowing at C5-6.  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  Mild noncompressive bulging C3-4 and C4-5.  Some moderate right C6 foraminal stenosis.  Review of Systems sero positive rheumatoid arthritis on Plaquenil.  All other systems noncontributory to HPI.   Objective: Vital Signs: BP 137/84   Pulse 79   Ht 5' 3"$  (1.6 m)   Wt 205 lb (93 kg)   BMI 36.31 kg/m   Physical Exam Constitutional:      Appearance: She is well-developed.  HENT:      Head: Normocephalic.     Right Ear: External ear normal.     Left Ear: External ear normal. There is no impacted cerumen.  Eyes:     Pupils: Pupils are equal, round, and reactive to light.  Neck:     Thyroid: No thyromegaly.     Trachea: No tracheal deviation.  Cardiovascular:     Rate and Rhythm: Normal rate.  Pulmonary:     Effort: Pulmonary effort is normal.  Abdominal:     Palpations: Abdomen is soft.  Musculoskeletal:     Cervical back: No rigidity.  Skin:    General: Skin is warm and dry.  Neurological:     Mental Status: She is alert and oriented to person, place, and time.  Psychiatric:        Behavior: Behavior normal.     Ortho Exam tenderness over the wrist with rotation she has slight pop tenderness of the TFCC dorsal wrist capsule.  First second third dorsal compartments are normal to palpation.  Some tenderness over the carpal canal.  Good capillary refill.  Ulnar intrinsics are strong.  Specialty Comments:  No specialty comments available.  Imaging: No results found.   PMFS History: Patient Active Problem List   Diagnosis Date Noted   Pain in right  wrist 10/31/2022   Seropositive rheumatoid arthritis ([place]) 08/29/2022   Primary osteoarthritis of both hands 08/29/2022   Primary osteoarthritis of both feet 08/29/2022   DDD (degenerative disc disease), cervical 08/29/2022   DDD (degenerative disc disease), lumbar 08/29/2022   Chronic bilateral low back pain 02/01/2022   Chronic migraine w/o aura w/o status migrainosus, not intractable 02/01/2022   Paresthesia 11/30/2016   Neck pain 10/23/2016   Left cervical radiculopathy 10/23/2016   Vitamin D deficiency 08/10/2016   PCOS (polycystic ovarian syndrome) 06/16/2013   Migraines 06/16/2013   Hypothyroidism 06/10/2013   Past Medical History:  Diagnosis Date   Back pain    Cervical pain    Cervical spondylolysis    DDD (degenerative disc disease), lumbar    History of IBS    per patient   PCOS  (polycystic ovarian syndrome)    Rheumatoid arthritis ([place])    Scoliosis    Thyroid disease     Family History  Problem Relation Age of Onset   Thyroid disease Mother    Arthritis Mother    Diabetes Father    Congestive Heart Failure Father    Alcoholism Father    Neuropathy Father    Arthritis Father    Osteoarthritis Maternal Aunt    Rheum arthritis Paternal Grandmother    Healthy Daughter    Polycystic ovary syndrome Daughter     Past Surgical History:  Procedure Laterality Date   ANKLE SURGERY  2009   ORIF    THYROIDECTOMY     WISDOM TOOTH EXTRACTION     Social History   Occupational History   Occupation: ED - Admissions  Tobacco Use   Smoking status: Former    Years: 6.00    Types: Cigarettes    Quit date: 2012    Years since quitting: 12.1    Passive exposure: Never   Smokeless tobacco: Never   Tobacco comments:    Social smoker  Vaping Use   Vaping Use: Never used  Substance and Sexual Activity   Alcohol use: Yes    Comment: wine occ   Drug use: No   Sexual activity: Not on file

## 2022-11-09 ENCOUNTER — Other Ambulatory Visit (HOSPITAL_COMMUNITY): Payer: Self-pay

## 2022-11-09 DIAGNOSIS — R350 Frequency of micturition: Secondary | ICD-10-CM | POA: Diagnosis not present

## 2022-11-09 DIAGNOSIS — R109 Unspecified abdominal pain: Secondary | ICD-10-CM | POA: Diagnosis not present

## 2022-11-09 MED ORDER — NITROFURANTOIN MONOHYD MACRO 100 MG PO CAPS
100.0000 mg | ORAL_CAPSULE | Freq: Two times a day (BID) | ORAL | 0 refills | Status: DC
  Filled 2022-11-09: qty 14, 7d supply, fill #0

## 2022-11-12 ENCOUNTER — Other Ambulatory Visit (HOSPITAL_COMMUNITY): Payer: Self-pay

## 2022-11-12 DIAGNOSIS — R109 Unspecified abdominal pain: Secondary | ICD-10-CM | POA: Diagnosis not present

## 2022-11-12 DIAGNOSIS — R5383 Other fatigue: Secondary | ICD-10-CM | POA: Diagnosis not present

## 2022-11-12 DIAGNOSIS — R42 Dizziness and giddiness: Secondary | ICD-10-CM | POA: Diagnosis not present

## 2022-11-12 DIAGNOSIS — N921 Excessive and frequent menstruation with irregular cycle: Secondary | ICD-10-CM | POA: Diagnosis not present

## 2022-11-12 MED ORDER — NORETHINDRONE ACETATE 5 MG PO TABS
5.0000 mg | ORAL_TABLET | ORAL | 0 refills | Status: DC
  Filled 2022-11-12: qty 19, 10d supply, fill #0

## 2022-11-13 DIAGNOSIS — D259 Leiomyoma of uterus, unspecified: Secondary | ICD-10-CM | POA: Diagnosis not present

## 2022-11-13 DIAGNOSIS — N921 Excessive and frequent menstruation with irregular cycle: Secondary | ICD-10-CM | POA: Diagnosis not present

## 2022-11-13 DIAGNOSIS — Z3202 Encounter for pregnancy test, result negative: Secondary | ICD-10-CM | POA: Diagnosis not present

## 2022-11-23 ENCOUNTER — Other Ambulatory Visit (HOSPITAL_BASED_OUTPATIENT_CLINIC_OR_DEPARTMENT_OTHER): Payer: Self-pay | Admitting: Physician Assistant

## 2022-11-23 DIAGNOSIS — M5030 Other cervical disc degeneration, unspecified cervical region: Secondary | ICD-10-CM | POA: Diagnosis not present

## 2022-11-23 DIAGNOSIS — G43909 Migraine, unspecified, not intractable, without status migrainosus: Secondary | ICD-10-CM | POA: Diagnosis not present

## 2022-11-23 DIAGNOSIS — M5136 Other intervertebral disc degeneration, lumbar region: Secondary | ICD-10-CM | POA: Diagnosis not present

## 2022-11-23 DIAGNOSIS — M419 Scoliosis, unspecified: Secondary | ICD-10-CM | POA: Diagnosis not present

## 2022-11-23 DIAGNOSIS — Z0000 Encounter for general adult medical examination without abnormal findings: Secondary | ICD-10-CM | POA: Diagnosis not present

## 2022-11-23 DIAGNOSIS — R42 Dizziness and giddiness: Secondary | ICD-10-CM | POA: Diagnosis not present

## 2022-11-23 DIAGNOSIS — E039 Hypothyroidism, unspecified: Secondary | ICD-10-CM | POA: Diagnosis not present

## 2022-11-23 DIAGNOSIS — E7800 Pure hypercholesterolemia, unspecified: Secondary | ICD-10-CM | POA: Diagnosis not present

## 2022-11-23 DIAGNOSIS — R109 Unspecified abdominal pain: Secondary | ICD-10-CM

## 2022-11-23 DIAGNOSIS — M069 Rheumatoid arthritis, unspecified: Secondary | ICD-10-CM | POA: Diagnosis not present

## 2022-11-25 ENCOUNTER — Ambulatory Visit (HOSPITAL_BASED_OUTPATIENT_CLINIC_OR_DEPARTMENT_OTHER)
Admission: RE | Admit: 2022-11-25 | Discharge: 2022-11-25 | Disposition: A | Discharge status: Home / Self Care | Payer: Commercial Managed Care - PPO | Source: Ambulatory Visit | Attending: Physician Assistant | Admitting: Physician Assistant

## 2022-11-25 DIAGNOSIS — R109 Unspecified abdominal pain: Secondary | ICD-10-CM | POA: Insufficient documentation

## 2022-12-03 ENCOUNTER — Other Ambulatory Visit (HOSPITAL_COMMUNITY): Payer: Self-pay

## 2022-12-04 ENCOUNTER — Other Ambulatory Visit (HOSPITAL_COMMUNITY): Payer: Self-pay

## 2022-12-10 ENCOUNTER — Other Ambulatory Visit (HOSPITAL_COMMUNITY): Payer: Self-pay

## 2022-12-10 DIAGNOSIS — K589 Irritable bowel syndrome without diarrhea: Secondary | ICD-10-CM | POA: Diagnosis not present

## 2022-12-10 DIAGNOSIS — Z1211 Encounter for screening for malignant neoplasm of colon: Secondary | ICD-10-CM | POA: Diagnosis not present

## 2022-12-10 MED ORDER — DICYCLOMINE HCL 10 MG PO CAPS
10.0000 mg | ORAL_CAPSULE | Freq: Three times a day (TID) | ORAL | 3 refills | Status: AC
  Filled 2022-12-10: qty 180, 60d supply, fill #0
  Filled 2023-04-10: qty 180, 60d supply, fill #1

## 2022-12-11 ENCOUNTER — Telehealth: Payer: Self-pay | Admitting: Gastroenterology

## 2022-12-11 NOTE — Telephone Encounter (Signed)
Good morning Dr. Havery Moros,   We received a referral from this patient for changed in bowel habits. Patient was previously under your care in 2018 then transferred her care to [facility]. Records were obtained for review and have been scanned under media as "[facility] Transfer of Care Records". She has now learned that her provider at [facility] is leaving and would like to transfer her care back to [facility]. She has also changed her insurance. She is requesting to be scheduled with Dr. Silverio Decamp. Would you be willing to accept this transfer?

## 2022-12-12 NOTE — Telephone Encounter (Signed)
I've never actually seen this patient - she saw Ellouise Newer in 2018. Okay with me if she wants to see Dr. Silverio Decamp, but will need to ask Dr. Silverio Decamp if okay with her. Thanks  Viviana Simpler

## 2022-12-14 ENCOUNTER — Other Ambulatory Visit (INDEPENDENT_AMBULATORY_CARE_PROVIDER_SITE_OTHER): Payer: Commercial Managed Care - PPO

## 2022-12-14 DIAGNOSIS — E890 Postprocedural hypothyroidism: Secondary | ICD-10-CM | POA: Diagnosis not present

## 2022-12-14 NOTE — Addendum Note (Signed)
Addended by: Cleda Mccreedy F on: 12/14/2022 01:57 PM   Modules accepted: Orders

## 2022-12-15 LAB — T4, FREE: Free T4: 0.8 ng/dL (ref 0.8–1.8)

## 2022-12-15 LAB — T3, FREE: T3, Free: 3.1 pg/mL (ref 2.3–4.2)

## 2022-12-15 LAB — TSH: TSH: 2.62 mIU/L

## 2022-12-17 NOTE — Telephone Encounter (Signed)
Called and left message for patient to call back and schedule OV with Dr. Lavon Paganini.

## 2022-12-17 NOTE — Telephone Encounter (Signed)
ok, please schedule next available appointment.  Thanks

## 2023-01-02 ENCOUNTER — Other Ambulatory Visit: Payer: Self-pay

## 2023-01-09 ENCOUNTER — Other Ambulatory Visit (HOSPITAL_COMMUNITY): Payer: Self-pay

## 2023-01-11 ENCOUNTER — Other Ambulatory Visit (HOSPITAL_COMMUNITY): Payer: Self-pay

## 2023-01-15 NOTE — Progress Notes (Signed)
Office Visit Note  Patient: Taylor Bennett             Date of Birth: 1980/12/08           MRN: [id number]             PCP: Milus Height, PA Referring: Milus Height, PA Visit Date: 01/29/2023 Occupation: @GUAROCC @  Subjective:  Trochanteric bursitis bilaterally  History of Present Illness: Taylor Bennett is a 42 y.o. female with history of seropositive rheumatoid arthritis, osteoarthritis, DDD, and myofascial pain.  She is taking plaquenil 200 mg 1 tablet by mouth twice daily Monday through Friday.  She is tolerating Plaquenil without any side effects.  Patient states she has intermittent arthralgias and joint stiffness especially in her hands, hips, and both feet.  She states that at times she notices swelling especially in her right hand over overuse activities such as typing all day.  She states that since starting to work from home she has noticed an improvement in her quality of life and has had to take less FMLA.  She states that in the past she has been given exercises for trochanteric bursitis but she has not been performing these consistently.  She is currently having tenderness and stiffness in both hips consistent with trochanteric bursitis.  Activities of Daily Living:  Patient reports morning stiffness for all day. Patient Reports nocturnal pain.  Difficulty dressing/grooming: Reports Difficulty climbing stairs: Reports Difficulty getting out of chair: Reports Difficulty using hands for taps, buttons, cutlery, and/or writing: Reports  Review of Systems  Constitutional:  Positive for fatigue.  HENT:  Positive for ear pain and mouth dryness. Negative for mouth sores.   Eyes:  Negative for dryness.  Respiratory:  Positive for cough, shortness of breath and wheezing.   Cardiovascular:  Positive for chest pain. Negative for palpitations.  Gastrointestinal:  Negative for blood in stool, constipation and diarrhea.  Endocrine: Positive for increased urination.   Genitourinary:  Negative for involuntary urination.  Musculoskeletal:  Positive for joint pain, joint pain, myalgias, morning stiffness, muscle tenderness and myalgias. Negative for gait problem, joint swelling and muscle weakness.  Skin:  Positive for rash and hair loss. Negative for color change and sensitivity to sunlight.  Allergic/Immunologic: Positive for susceptible to infections.  Neurological:  Negative for dizziness and headaches.  Hematological:  Negative for swollen glands.  Psychiatric/Behavioral:  Positive for sleep disturbance. Negative for depressed mood. The patient is not nervous/anxious.     PMFS History:  Patient Active Problem List   Diagnosis Date Noted   Pain in right wrist 10/31/2022   Seropositive rheumatoid arthritis (HCC) 08/29/2022   Primary osteoarthritis of both hands 08/29/2022   Primary osteoarthritis of both feet 08/29/2022   DDD (degenerative disc disease), cervical 08/29/2022   DDD (degenerative disc disease), lumbar 08/29/2022   Chronic bilateral low back pain 02/01/2022   Chronic migraine w/o aura w/o status migrainosus, not intractable 02/01/2022   Paresthesia 11/30/2016   Neck pain 10/23/2016   Left cervical radiculopathy 10/23/2016   Vitamin D deficiency 08/10/2016   PCOS (polycystic ovarian syndrome) 06/16/2013   Migraines 06/16/2013   Hypothyroidism 06/10/2013    Past Medical History:  Diagnosis Date   Back pain    Cervical pain    Cervical spondylolysis    DDD (degenerative disc disease), lumbar    History of IBS    per patient   PCOS (polycystic ovarian syndrome)    Rheumatoid arthritis (HCC)    Scoliosis  Thyroid disease     Family History  Problem Relation Age of Onset   Thyroid disease Mother    Arthritis Mother    Diabetes Father    Congestive Heart Failure Father    Alcoholism Father    Neuropathy Father    Arthritis Father    Osteoarthritis Maternal Aunt    Rheum arthritis Paternal Grandmother    Healthy  Daughter    Polycystic ovary syndrome Daughter    Past Surgical History:  Procedure Laterality Date   ANKLE SURGERY  2009   ORIF    THYROIDECTOMY     WISDOM TOOTH EXTRACTION     Social History   Social History Narrative   Lives at home with her fiance and children.   Right-handed.   1-2 cup caffeine daily.   Immunization History  Administered Date(s) Administered   Influenza-Unspecified 06/03/2014, 06/20/2015   Moderna Sars-Covid-2 Vaccination 10/02/2019, 11/02/2019, 07/09/2020     Objective: Vital Signs: BP 134/82 (BP Location: Left Arm, Patient Position: Sitting, Cuff Size: Normal)   Pulse 99   Resp 17   Ht 5\' 3"  (1.6 m)   Wt 208 lb 6.4 oz (94.5 kg)   BMI 36.92 kg/m    Physical Exam Vitals and nursing note reviewed.  Constitutional:      Appearance: She is well-developed.  HENT:     Head: Normocephalic and atraumatic.  Eyes:     Conjunctiva/sclera: Conjunctivae normal.  Cardiovascular:     Rate and Rhythm: Normal rate and regular rhythm.     Heart sounds: Normal heart sounds.  Pulmonary:     Effort: Pulmonary effort is normal.     Breath sounds: Normal breath sounds.  Abdominal:     General: Bowel sounds are normal.     Palpations: Abdomen is soft.  Musculoskeletal:     Cervical back: Normal range of motion.  Lymphadenopathy:     Cervical: No cervical adenopathy.  Skin:    General: Skin is warm and dry.     Capillary Refill: Capillary refill takes less than 2 seconds.  Neurological:     Mental Status: She is alert and oriented to person, place, and time.  Psychiatric:        Behavior: Behavior normal.      Musculoskeletal Exam: Generalized hyperalgesia and positive tender points noted on examination today.  C-spine, thoracic spine, lumbar spine good range of motion.  Limited mobility and discomfort in the lumbar spine noted.  Shoulder joints, elbow joints, wrist joints, MCPs, PIPs, DIPs have good range of motion with no synovitis.  Mild DIP prominence  consistent with early osteoarthritic changes noted.  Complete fist formation bilaterally.  Hip joints have good range of motion with no groin pain.  Tenderness over bilateral trochanteric bursitis.  Knee joints have good range of motion with no warmth or effusion.  Ankle joints have good range of motion with no tenderness or joint swelling.  No tenderness or synovitis over MTP joints.  CDAI Exam: CDAI Score: -- Patient Global: 5 mm; Provider Global: 3 mm Swollen: --; Tender: -- Joint Exam 01/29/2023   No joint exam has been documented for this visit   There is currently no information documented on the homunculus. Go to the Rheumatology activity and complete the homunculus joint exam.  Investigation: No additional findings.  Imaging: No results found.  Recent Labs: Lab Results  Component Value Date   WBC 7.0 08/29/2022   HGB 12.9 08/29/2022   PLT 330 08/29/2022   NA 141  08/29/2022   K 4.2 08/29/2022   CL 107 08/29/2022   CO2 28 08/29/2022   GLUCOSE 83 08/29/2022   BUN 13 08/29/2022   CREATININE 0.96 08/29/2022   BILITOT 0.3 08/29/2022   ALKPHOS 58 07/19/2015   AST 14 08/29/2022   ALT 11 08/29/2022   PROT 6.3 08/29/2022   ALBUMIN 3.6 07/19/2015   CALCIUM 9.0 08/29/2022   GFRAA 104 12/08/2020    Speciality Comments: PLQ EYe Exam: 01/16/2022 WNL @ [facility] f/u 1 year  TB Gold: 07/21/2021 Negative  Procedures:  No procedures performed Allergies: Patient has no known allergies.    Assessment / Plan:     Visit Diagnoses: Seropositive rheumatoid arthritis (HCC) - +14 3 3  eta, elevated sedimentation rate in March 2021, +synovitis on ultrasound: No synovitis was noted on examination today.  Overall her rheumatoid arthritis appears well-controlled taking Plaquenil 200 mg 1 tablet by mouth twice daily Monday through Friday.  She is tolerating Plaquenil without any side effects and has not missed any doses recently.  She continues to have intermittent arthralgias  and joint stiffness in her hands, both hips, and both feet.  She has noticed some changes in her hands especially in the DIP joints consistent with osteoarthritis.  She had no inflammation on examination today.  Discussed that if she develops more frequent flares would recommend scheduling an ultrasound to assess for synovitis.  She was in agreement.  She will remain on Plaquenil as prescribed.  She will follow-up in the office in 5 months or sooner if needed.  High risk medication use - Plaquenil 200 mg 1 tablet by mouth twice daily monday through friday.  CBC and CMP updated on 08/29/22. Orders for CBC and CMP released today.  She had an updated Plaquenil eye examination on 01/22/2023.  We will call to obtain these records. - Plan: CBC with Differential/Platelet, COMPLETE METABOLIC PANEL WITH GFR  Primary osteoarthritis of both hands: She has noticed some increased DIP prominence consistent with early osteoarthritic changes.  She has no synovitis on examination today.  Complete fist formation bilaterally.  If she continues to have persistent discomfort we can schedule an ultrasound to assess for synovitis.  Chondromalacia patellae, left knee: Good ROM of the left knee with no warmth or effusion noted on exam.   Trochanteric bursitis of both hips: She presents today with tenderness to palpation over bilateral trochanteric bursa.  Patient was encouraged to perform stretching exercises on a daily basis.  Patient was given a handout of exercises to perform.   Primary osteoarthritis of both feet: No tenderness or synovitis over MTP joints noted.  Both ankle joints have good range of motion with no tenderness or inflammation.  She experiences intermittent discomfort in both feet.  We can schedule an ultrasound in the future to assess for synovitis if needed.  Chronic SI joint pain: Encouraged patient to perform stretching exercises daily and to change positions frequently throughout the day while sitting  for work.  DDD (degenerative disc disease), cervical - Mild degenerative changes were noted at C3-C4 and C4-C5 without spinal stenosis.  Congenital fusion of C6-C7 vertebral bodie  Other idiopathic scoliosis, thoracic region  DDD (degenerative disc disease), lumbar - Dr. Roetta Sessions May 2023 transitional lumbosacral anatomy, mild lumbar disc, facet degeneration w/o spinal stenosi, mild left neuroforaminal stenosis at L4-5.  Myofascial pain: Patient has generalized hyperalgesia on examination consistent with myofascial pain.  She experiences interval myalgias and muscle tenderness.  She is having discomfort due to  trochanteric bursitis in both hips currently.  She also has some intercostal muscle tenderness.  She takes Skelaxin as needed for muscle spasms.  Other medical conditions are listed as follows:   Plantar fasciitis, bilateral: Not currently active.    Hair loss  Plantar warts  Hx of migraines  PCOS (polycystic ovarian syndrome)  Vitamin D deficiency  Postoperative hypothyroidism  Family history of rheumatoid arthritis  Orders: Orders Placed This Encounter  Procedures   CBC with Differential/Platelet   COMPLETE METABOLIC PANEL WITH GFR   Meds ordered this encounter  Medications   hydroxychloroquine (PLAQUENIL) 200 MG tablet    Sig: Take 1 tablet (200 mg total) by mouth 2 (two) times daily from Monday to Friday.    Dispense:  120 tablet    Refill:  0   Follow-Up Instructions: Return in about 5 months (around 07/01/2023) for Rheumatoid arthritis, Osteoarthritis, DDD.   Gearldine Bienenstock, PA-C  Note - This record has been created using Dragon software.  Chart creation errors have been sought, but may not always  have been located. Such creation errors do not reflect on  the standard of medical care.

## 2023-01-22 DIAGNOSIS — Z79899 Other long term (current) drug therapy: Secondary | ICD-10-CM | POA: Diagnosis not present

## 2023-01-22 DIAGNOSIS — M069 Rheumatoid arthritis, unspecified: Secondary | ICD-10-CM | POA: Diagnosis not present

## 2023-01-29 ENCOUNTER — Encounter: Payer: Self-pay | Admitting: Physician Assistant

## 2023-01-29 ENCOUNTER — Ambulatory Visit: Payer: Commercial Managed Care - PPO | Attending: Physician Assistant | Admitting: Physician Assistant

## 2023-01-29 ENCOUNTER — Other Ambulatory Visit (HOSPITAL_COMMUNITY): Payer: Self-pay

## 2023-01-29 VITALS — BP 134/82 | HR 99 | Resp 17 | Ht 63.0 in | Wt 208.4 lb

## 2023-01-29 DIAGNOSIS — M4124 Other idiopathic scoliosis, thoracic region: Secondary | ICD-10-CM | POA: Diagnosis not present

## 2023-01-29 DIAGNOSIS — M5136 Other intervertebral disc degeneration, lumbar region: Secondary | ICD-10-CM | POA: Diagnosis not present

## 2023-01-29 DIAGNOSIS — M7062 Trochanteric bursitis, left hip: Secondary | ICD-10-CM

## 2023-01-29 DIAGNOSIS — M533 Sacrococcygeal disorders, not elsewhere classified: Secondary | ICD-10-CM

## 2023-01-29 DIAGNOSIS — L659 Nonscarring hair loss, unspecified: Secondary | ICD-10-CM

## 2023-01-29 DIAGNOSIS — M5030 Other cervical disc degeneration, unspecified cervical region: Secondary | ICD-10-CM

## 2023-01-29 DIAGNOSIS — Z79899 Other long term (current) drug therapy: Secondary | ICD-10-CM | POA: Diagnosis not present

## 2023-01-29 DIAGNOSIS — M059 Rheumatoid arthritis with rheumatoid factor, unspecified: Secondary | ICD-10-CM

## 2023-01-29 DIAGNOSIS — M19041 Primary osteoarthritis, right hand: Secondary | ICD-10-CM | POA: Diagnosis not present

## 2023-01-29 DIAGNOSIS — M7918 Myalgia, other site: Secondary | ICD-10-CM

## 2023-01-29 DIAGNOSIS — G8929 Other chronic pain: Secondary | ICD-10-CM

## 2023-01-29 DIAGNOSIS — E559 Vitamin D deficiency, unspecified: Secondary | ICD-10-CM

## 2023-01-29 DIAGNOSIS — M7061 Trochanteric bursitis, right hip: Secondary | ICD-10-CM

## 2023-01-29 DIAGNOSIS — M722 Plantar fascial fibromatosis: Secondary | ICD-10-CM

## 2023-01-29 DIAGNOSIS — M51369 Other intervertebral disc degeneration, lumbar region without mention of lumbar back pain or lower extremity pain: Secondary | ICD-10-CM

## 2023-01-29 DIAGNOSIS — M19071 Primary osteoarthritis, right ankle and foot: Secondary | ICD-10-CM

## 2023-01-29 DIAGNOSIS — B070 Plantar wart: Secondary | ICD-10-CM

## 2023-01-29 DIAGNOSIS — E890 Postprocedural hypothyroidism: Secondary | ICD-10-CM

## 2023-01-29 DIAGNOSIS — Z8261 Family history of arthritis: Secondary | ICD-10-CM

## 2023-01-29 DIAGNOSIS — M2242 Chondromalacia patellae, left knee: Secondary | ICD-10-CM

## 2023-01-29 DIAGNOSIS — Z8669 Personal history of other diseases of the nervous system and sense organs: Secondary | ICD-10-CM

## 2023-01-29 DIAGNOSIS — M19072 Primary osteoarthritis, left ankle and foot: Secondary | ICD-10-CM

## 2023-01-29 DIAGNOSIS — E282 Polycystic ovarian syndrome: Secondary | ICD-10-CM

## 2023-01-29 DIAGNOSIS — M19042 Primary osteoarthritis, left hand: Secondary | ICD-10-CM

## 2023-01-29 MED ORDER — HYDROXYCHLOROQUINE SULFATE 200 MG PO TABS
200.0000 mg | ORAL_TABLET | Freq: Two times a day (BID) | ORAL | 0 refills | Status: DC
  Filled 2023-01-29: qty 120, 84d supply, fill #0

## 2023-01-29 NOTE — Patient Instructions (Signed)
Hip Bursitis Rehab Ask your health care provider which exercises are safe for you. Do exercises exactly as told by your health care provider and adjust them as directed. It is normal to feel mild stretching, pulling, tightness, or discomfort as you do these exercises. Stop right away if you feel sudden pain or your pain gets worse. Do not begin these exercises until told by your health care provider. Stretching exercise This exercise warms up your muscles and joints and improves the movement and flexibility of your hip. This exercise also helps to relieve pain and stiffness. Iliotibial band stretch An iliotibial band is a strong band of muscle tissue that runs from the outer side of your hip to the outer side of your thigh and knee. Lie on your side with your left / right leg in the top position. Bend your left / right knee and grab your ankle. Stretch out your bottom arm to help you balance. Slowly bring your knee back so your thigh is slightly behind your body. Slowly lower your knee toward the floor until you feel a gentle stretch on the outside of your left / right thigh. If you do not feel a stretch and your knee will not lower more toward the floor, place the heel of your other foot on top of your knee and pull your knee down toward the floor with your foot. Hold this position for __________ seconds. Slowly return to the starting position. Repeat __________ times. Complete this exercise __________ times a day. Strengthening exercises These exercises build strength and endurance in your hip and pelvis. Endurance is the ability to use your muscles for a long time, even after they get tired. Bridge This exercise strengthens the muscles that move your thigh backward (hip extensors). Lie on your back on a firm surface with your knees bent and your feet flat on the floor. Tighten your buttocks muscles and lift your buttocks off the floor until your trunk is level with your thighs. Do not arch your  back. You should feel the muscles working in your buttocks and the back of your thighs. If you do not feel these muscles, slide your feet 1-2 inches (2.5-5 cm) farther away from your buttocks. If this exercise is too easy, try doing it with your arms crossed over your chest. Hold this position for __________ seconds. Slowly lower your hips to the starting position. Let your muscles relax completely after each repetition. Repeat __________ times. Complete this exercise __________ times a day. Squats This exercise strengthens the muscles in front of your thigh and knee (quadriceps). Stand in front of a table, with your feet and knees pointing straight ahead. You may rest your hands on the table for balance but not for support. Slowly bend your knees and lower your hips like you are going to sit in a chair. Keep your weight over your heels, not over your toes. Keep your lower legs upright so they are parallel with the table legs. Do not let your hips go lower than your knees. Do not bend lower than told by your health care provider. If your hip pain increases, do not bend as low. Hold the squat position for __________ seconds. Slowly push with your legs to return to standing. Do not use your hands to pull yourself to standing. Repeat __________ times. Complete this exercise __________ times a day. Hip hike  Stand sideways on a bottom step. Stand on your left / right leg with your other foot unsupported next to   the step. You can hold on to the railing or wall for balance if needed. Keep your knees straight and your torso square. Then lift your left / right hip up toward the ceiling. Hold this position for __________ seconds. Slowly let your left / right hip lower toward the floor, past the starting position. Your foot should get closer to the floor. Do not lean or bend your knees. Repeat __________ times. Complete this exercise __________ times a day. Single leg stand This exercise increases  your balance. Without shoes, stand near a railing or in a doorway. You may hold on to the railing or door frame as needed for balance. Squeeze your left / right buttock muscles, then lift up your other foot. Do not let your left / right hip push out to the side. It is helpful to stand in front of a mirror for this exercise so you can watch your hip. Hold this position for __________ seconds. Repeat __________ times. Complete this exercise __________ times a day. This information is not intended to replace advice given to you by your health care provider. Make sure you discuss any questions you have with your health care provider. Document Revised: 08/09/2021 Document Reviewed: 08/09/2021 Elsevier Patient Education  2023 [employer]  

## 2023-01-30 ENCOUNTER — Other Ambulatory Visit (HOSPITAL_COMMUNITY): Payer: Self-pay

## 2023-01-30 DIAGNOSIS — R0981 Nasal congestion: Secondary | ICD-10-CM | POA: Diagnosis not present

## 2023-01-30 DIAGNOSIS — M069 Rheumatoid arthritis, unspecified: Secondary | ICD-10-CM | POA: Diagnosis not present

## 2023-01-30 DIAGNOSIS — D72829 Elevated white blood cell count, unspecified: Secondary | ICD-10-CM | POA: Diagnosis not present

## 2023-01-30 LAB — COMPLETE METABOLIC PANEL WITH GFR
AG Ratio: 1.4 (calc) (ref 1.0–2.5)
ALT: 10 U/L (ref 6–29)
AST: 14 U/L (ref 10–30)
Albumin: 3.9 g/dL (ref 3.6–5.1)
Alkaline phosphatase (APISO): 54 U/L (ref 31–125)
BUN: 11 mg/dL (ref 7–25)
CO2: 24 mmol/L (ref 20–32)
Calcium: 9.1 mg/dL (ref 8.6–10.2)
Chloride: 104 mmol/L (ref 98–110)
Creat: 0.81 mg/dL (ref 0.50–0.99)
Globulin: 2.8 g/dL (calc) (ref 1.9–3.7)
Glucose, Bld: 77 mg/dL (ref 65–99)
Potassium: 4.4 mmol/L (ref 3.5–5.3)
Sodium: 138 mmol/L (ref 135–146)
Total Bilirubin: 0.4 mg/dL (ref 0.2–1.2)
Total Protein: 6.7 g/dL (ref 6.1–8.1)
eGFR: 92 mL/min/{1.73_m2} (ref >=60)

## 2023-01-30 LAB — CBC WITH DIFFERENTIAL/PLATELET
Absolute Monocytes: 1130 cells/uL — ABNORMAL HIGH (ref 200–950)
Basophils Absolute: 45 cells/uL (ref 0–200)
Basophils Relative: 0.4 %
Eosinophils Absolute: 102 cells/uL (ref 15–500)
Eosinophils Relative: 0.9 %
HCT: 36.8 % (ref 35.0–45.0)
Hemoglobin: 12.1 g/dL (ref 11.7–15.5)
Lymphs Abs: 2181 cells/uL (ref 850–3900)
MCH: 28.9 pg (ref 27.0–33.0)
MCHC: 32.9 g/dL (ref 32.0–36.0)
MCV: 87.8 fL (ref 80.0–100.0)
MPV: 9.4 fL (ref 7.5–12.5)
Monocytes Relative: 10 %
Neutro Abs: 7842 cells/uL — ABNORMAL HIGH (ref 1500–7800)
Neutrophils Relative %: 69.4 %
Platelets: 411 10*3/uL — ABNORMAL HIGH (ref 140–400)
RBC: 4.19 10*6/uL (ref 3.80–5.10)
RDW: 13.1 % (ref 11.0–15.0)
Total Lymphocyte: 19.3 %
WBC: 11.3 10*3/uL — ABNORMAL HIGH (ref 3.8–10.8)

## 2023-01-30 MED ORDER — AMOXICILLIN-POT CLAVULANATE 875-125 MG PO TABS
1.0000 | ORAL_TABLET | Freq: Two times a day (BID) | ORAL | 0 refills | Status: DC
Start: 2023-01-30 — End: 2023-04-26
  Filled 2023-01-30: qty 14, 7d supply, fill #0

## 2023-01-30 NOTE — Progress Notes (Signed)
CMP WNL WBC count is elevated-11.3. absolute neutrophils are elevated-patient was experiencing sinus congestion and cough yesterday and was advised to follow up with PCP For further evaluation and treatment.  Plt count is borderline elevated-could be reactive due to current infection

## 2023-01-31 ENCOUNTER — Telehealth: Payer: Self-pay

## 2023-01-31 NOTE — Telephone Encounter (Signed)
She may stop hydroxychloroquine for the few days until the infection clears up and then may restart hydroxychloroquine.

## 2023-01-31 NOTE — Telephone Encounter (Signed)
Patient advised She may stop hydroxychloroquine for the few days until the infection clears up and then may restart hydroxychloroquine. Patient verbalized understanding.

## 2023-01-31 NOTE — Telephone Encounter (Signed)
Patient states she went to see her PCP and was told she has a sinus infection and has been put on amoxicillin. Patient inquires if she needs to stop taking the Plaquenil and how long. Patient call back number is [phone]. Please advise.

## 2023-04-10 ENCOUNTER — Other Ambulatory Visit: Payer: Self-pay | Admitting: Physician Assistant

## 2023-04-10 ENCOUNTER — Other Ambulatory Visit (HOSPITAL_COMMUNITY): Payer: Self-pay

## 2023-04-10 ENCOUNTER — Other Ambulatory Visit: Payer: Self-pay | Admitting: Nurse Practitioner

## 2023-04-10 DIAGNOSIS — Z1231 Encounter for screening mammogram for malignant neoplasm of breast: Secondary | ICD-10-CM

## 2023-04-10 DIAGNOSIS — Z3044 Encounter for surveillance of vaginal ring hormonal contraceptive device: Secondary | ICD-10-CM | POA: Diagnosis not present

## 2023-04-10 DIAGNOSIS — Z01419 Encounter for gynecological examination (general) (routine) without abnormal findings: Secondary | ICD-10-CM | POA: Diagnosis not present

## 2023-04-10 MED ORDER — HYDROXYCHLOROQUINE SULFATE 200 MG PO TABS
200.0000 mg | ORAL_TABLET | Freq: Two times a day (BID) | ORAL | 0 refills | Status: DC
  Filled 2023-04-10: qty 120, 60d supply, fill #0

## 2023-04-10 MED ORDER — ETONOGESTREL-ETHINYL ESTRADIOL 0.12-0.015 MG/24HR VA RING
VAGINAL_RING | VAGINAL | 5 refills | Status: DC
  Filled 2023-04-10: qty 4, 21d supply, fill #0
  Filled 2023-08-01: qty 4, 84d supply, fill #0

## 2023-04-10 NOTE — Telephone Encounter (Signed)
Last Fill: 01/29/2023  Eye exam: 01/22/2023 WNL    Labs: 01/29/2023 CMP WNL WBC count is elevated-11.3. absolute neutrophils are elevated. Plt count is borderline elevated   Next Visit: 07/11/2023  Last Visit: 01/29/2023  DX: Seropositive rheumatoid arthritis   Current Dose per office note 01/29/2023: Plaquenil 200 mg 1 tablet by mouth twice daily monday through friday   Okay to refill Plaquenil?

## 2023-04-24 NOTE — Progress Notes (Signed)
Taylor Bennett    [id number]    [birth year]  Primary Care Physician:Redmon, [name], PA  Referring Physician: Milus Height, PA [address] [place],  [place] [address]   Chief complaint:  Chief Complaint  Patient presents with   Irritable Bowel Syndrome    Patient states she is here to get established with Dr. Lavon Paganini. Patient reports she is not currently having any GI sx at this time. Patient has alternating bowel habits at times.     HPI: Taylor Bennett is a 42 y.o. female presenting to clinic today for consult for changes in stool.  Today, she reports feeling well overall. She reports being diagnosed with IBS. She states that she would experience a flare up typically once a month.  When having a flare up, she would experiencing either severe constipation or diarrhea. She states that she typically experience her symptoms including abdominal discomfort mostly at night and it would occasionally have to wake up from her sleep due to her pain. She states that taking Dicyclomine 10mg  helps manages her symptoms.  She also reports experiencing occasional nausea and states taking ginger pills help relief her symptoms. She has also been taking Align probiotics regularly for several months now.    She reports typically has 1-3x BMs every other day. She also reports that she attempts to include a low processed diet.  She reports a family history of paternal grandfather and father having colon cancer. Her father most likely was diagnosed with colon cancer at the age of 36.  Patient denies blood in stool, black stool, vomiting, bloating, unintentional weight loss, reflux, dysphagia.  GI Hx:  CT Abdomen Pelvis w contrast 11-25-22 1. No acute process identified in the abdomen or pelvis. No renal or ureteral stones.  CT Abdomen Pelvis w contrast 06-12-21 No acute intra-abdominal or intrapelvic abnormalities.     Current Outpatient Medications:     Ascorbic Acid (VITAMIN C PO), Take by mouth daily., Disp: , Rfl:    Cholecalciferol (VITAMIN D PO), Take 5,000 Units by mouth 2 (two) times daily. , Disp: , Rfl:    diclofenac Sodium (VOLTAREN) 1 % GEL, Apply 2 g topically 4 (four) times daily. (Patient taking differently: Apply 2 g topically as needed.), Disp: 300 g, Rfl: 2   dicyclomine (BENTYL) 10 MG capsule, Take 1 capsule (10 mg total) by mouth 3 (three) times daily as needed for abdominal pain., Disp: 180 capsule, Rfl: 3   etonogestrel-ethinyl estradiol (NUVARING) 0.12-0.015 MG/24HR vaginal ring, Insert 1 ring vaginally and leave in place for 3 weeks, remove, and replace with a new ring every 21 days as directed., Disp: 4 each, Rfl: 5   hydroxychloroquine (PLAQUENIL) 200 MG tablet, Take 1 tablet (200 mg total) by mouth 2 (two) times daily from Monday to Friday., Disp: 120 tablet, Rfl: 0   ibuprofen (ADVIL,MOTRIN) 200 MG tablet, Takes 600mg  - 800mg  daily, as needed., Disp: , Rfl:    loratadine (CLARITIN) 10 MG tablet, Take 10 mg by mouth daily., Disp: , Rfl:    meclizine (ANTIVERT) 25 MG tablet, Take 1 tablet (25 mg total) by mouth 3 (three) times daily as needed for dizziness., Disp: 20 tablet, Rfl: 0   Metaxalone (SKELAXIN PO), Take by mouth as needed., Disp: , Rfl:    rizatriptan (MAXALT) 10 MG tablet, Take 1 tablet by mouth at the onset of headache. May repeat in 2 hours if needed. (max 2 tabs in  24 hours), Disp: 6 tablet, Rfl: 3   spironolactone (ALDACTONE) 50 MG tablet, Take 1 tablet (50 mg total) by mouth 2 (two) times daily., Disp: 60 tablet, Rfl: 2   thyroid (NP THYROID) 90 MG tablet, Take 1 tablet (90 mg total) by mouth daily., Disp: 90 tablet, Rfl: 1   ZINC CITRATE-PHYTASE PO, Take by mouth., Disp: , Rfl:    Allergies as of 04/26/2023   (No Known Allergies)    Past Medical History:  Diagnosis Date   Back pain    Cervical pain    Cervical spondylolysis    DDD (degenerative disc disease), lumbar    History of IBS    per  patient   PCOS (polycystic ovarian syndrome)    Rheumatoid arthritis (HCC)    Scoliosis    Thyroid disease     Past Surgical History:  Procedure Laterality Date   ANKLE SURGERY  2009   ORIF    THYROIDECTOMY     WISDOM TOOTH EXTRACTION      Family History  Problem Relation Age of Onset   Thyroid disease Mother    Arthritis Mother    Diabetes Father    Congestive Heart Failure Father    Alcoholism Father    Neuropathy Father    Arthritis Father    Osteoarthritis Maternal Aunt    Rheum arthritis Paternal Grandmother    Healthy Daughter    Polycystic ovary syndrome Daughter     Social History   Socioeconomic History   Marital status: Significant Other    Spouse name: Not on file   Number of children: 2   Years of education: 2 years college   Highest education level: Not on file  Occupational History   Occupation: ED - Admissions  Tobacco Use   Smoking status: Former    Current packs/day: 0.00    Types: Cigarettes    Start date: 2006    Quit date: 2012    Years since quitting: 12.6    Passive exposure: Never   Smokeless tobacco: Never   Tobacco comments:    Social smoker  Vaping Use   Vaping status: Never Used  Substance and Sexual Activity   Alcohol use: Yes    Comment: wine occ   Drug use: No   Sexual activity: Not on file  Other Topics Concern   Not on file  Social History Narrative   Lives at home with her fiance and children.   Right-handed.   1-2 cup caffeine daily.   Social Determinants of Health   Financial Resource Strain: Not on file  Food Insecurity: Not on file  Transportation Needs: Not on file  Physical Activity: Not on file  Stress: Not on file  Social Connections: Unknown (01/08/2022)   Received from [facility], [facility]   Social Network    Social Network: Not on file  Intimate Partner Violence: Unknown (12/14/2021)   Received from [facility], [facility]   HITS    Physically Hurt: Not on file    Insult or Talk Down  To: Not on file    Threaten Physical Harm: Not on file    Scream or Curse: Not on file      Review of systems: Review of Systems  Constitutional:  Negative for unexpected weight change.  HENT:  Negative for trouble swallowing.   Gastrointestinal:  Positive for abdominal pain, constipation, diarrhea and nausea. Negative for abdominal distention, anal bleeding, blood in stool, rectal pain and vomiting.  Physical Exam: Vitals:   04/26/23 1435  BP: 124/74  Pulse: 78   Body mass index is 37.55 kg/m.  General: well-appearing   Eyes: sclera anicteric, no redness ENT: oral mucosa moist without lesions, no cervical or supraclavicular lymphadenopathy CV: RRR, no JVD, no peripheral edema Resp: clear to auscultation bilaterally, normal RR and effort noted GI: soft, no tenderness, with active bowel sounds. No guarding or palpable organomegaly noted. Skin; warm and dry, no rash or jaundice noted Neuro: awake, alert and oriented x 3. Normal gross motor function and fluent speech   Data Reviewed:  Reviewed labs, radiology imaging, old records and pertinent past GI work up   Assessment and Plan/Recommendations: 42 year old very pleasant female with history of IBS with remittent constipation and diarrhea, family history of colon cancer in her father in his early 89s and also grandfather Scheduled for colonoscopy for increased risk colon cancer screening The risks and benefits as well as alternatives of endoscopic procedure(s) have been discussed and reviewed. All questions answered. The patient agrees to proceed.   Use dicyclomine 10 mg every 8 hours as needed for abdominal cramping and IBS symptoms Patient was provided samples for FD guard and IBgard for dyspepsia and IBS to use up to 3 times daily as needed  Return in 1 year or sooner if needed    The patient was provided an opportunity to ask questions and all were answered. The patient agreed with the plan and demonstrated an  understanding of the instructions.  Iona Beard , MD  CC: Milus Height, [place]   W,GNFA M Kadhim,acting as a scribe for Marsa Aris, MD.,have documented all relevant documentation on the behalf of Marsa Aris, MD,as directed by  Marsa Aris, MD while in the presence of Marsa Aris, MD.   I, Marsa Aris, MD, have reviewed all documentation for this visit. The documentation on 04/26/23 for the exam, diagnosis, procedures, and orders are all accurate and complete.

## 2023-04-26 ENCOUNTER — Other Ambulatory Visit (HOSPITAL_COMMUNITY): Payer: Self-pay

## 2023-04-26 ENCOUNTER — Encounter: Payer: Self-pay | Admitting: Gastroenterology

## 2023-04-26 ENCOUNTER — Ambulatory Visit: Payer: Commercial Managed Care - PPO | Admitting: Gastroenterology

## 2023-04-26 VITALS — BP 124/74 | HR 78 | Ht 63.0 in | Wt 212.0 lb

## 2023-04-26 DIAGNOSIS — K582 Mixed irritable bowel syndrome: Secondary | ICD-10-CM

## 2023-04-26 DIAGNOSIS — R109 Unspecified abdominal pain: Secondary | ICD-10-CM

## 2023-04-26 DIAGNOSIS — L218 Other seborrheic dermatitis: Secondary | ICD-10-CM | POA: Diagnosis not present

## 2023-04-26 DIAGNOSIS — Z800 Family history of malignant neoplasm of digestive organs: Secondary | ICD-10-CM | POA: Diagnosis not present

## 2023-04-26 DIAGNOSIS — L811 Chloasma: Secondary | ICD-10-CM | POA: Diagnosis not present

## 2023-04-26 DIAGNOSIS — L650 Telogen effluvium: Secondary | ICD-10-CM | POA: Diagnosis not present

## 2023-04-26 MED ORDER — SPIRONOLACTONE 50 MG PO TABS
50.0000 mg | ORAL_TABLET | Freq: Two times a day (BID) | ORAL | 5 refills | Status: DC
  Filled 2023-04-26: qty 60, 30d supply, fill #0

## 2023-04-26 MED ORDER — NA SULFATE-K SULFATE-MG SULF 17.5-3.13-1.6 GM/177ML PO SOLN
1.0000 | Freq: Once | ORAL | 0 refills | Status: AC
  Filled 2023-04-26 – 2023-06-17 (×2): qty 354, 1d supply, fill #0

## 2023-04-26 MED ORDER — FLUTICASONE PROPIONATE 0.05 % EX CREA
TOPICAL_CREAM | CUTANEOUS | 1 refills | Status: DC
  Filled 2023-04-26: qty 30, 30d supply, fill #0

## 2023-04-26 MED ORDER — SPIRONOLACTONE 50 MG PO TABS
50.0000 mg | ORAL_TABLET | Freq: Two times a day (BID) | ORAL | 2 refills | Status: AC
Start: 2023-04-26 — End: ?
  Filled 2023-04-26 – 2023-05-06 (×2): qty 60, 30d supply, fill #0

## 2023-04-26 NOTE — Patient Instructions (Signed)
You have been scheduled for a colonoscopy. Please follow written instructions given to you at your visit today.   Please pick up your prep supplies at the pharmacy within the next 1-3 days.  If you use inhalers (even only as needed), please bring them with you on the day of your procedure.  DO NOT TAKE 7 DAYS PRIOR TO TEST- Trulicity (dulaglutide) Ozempic, Wegovy (semaglutide) Mounjaro (tirzepatide) Bydureon Bcise (exanatide extended release)  DO NOT TAKE 1 DAY PRIOR TO YOUR TEST Rybelsus (semaglutide) Adlyxin (lixisenatide) Victoza (liraglutide) Byetta (exanatide) ___________________________________________________________________________   We have sent the following medications to your pharmacy for you to pick up at your convenience: Dicyclomine  Use IBgard and FDgard three times a day as needed  Take Benefiber 1 tablespoon three times a day with meals   Due to recent changes in healthcare laws, you may see the results of your imaging and laboratory studies on MyChart before your provider has had a chance to review them.  We understand that in some cases there may be results that are confusing or concerning to you. Not all laboratory results come back in the same time frame and the provider may be waiting for multiple results in order to interpret others.  Please give [place] 48 hours in order for your provider to thoroughly review all the results before contacting the office for clarification of your results.    I appreciate the  opportunity to care for you  Thank You   Marsa Aris , MD

## 2023-04-29 ENCOUNTER — Encounter: Payer: Self-pay | Admitting: Gastroenterology

## 2023-05-06 ENCOUNTER — Other Ambulatory Visit: Payer: Self-pay | Admitting: Internal Medicine

## 2023-05-06 ENCOUNTER — Other Ambulatory Visit: Payer: Self-pay

## 2023-05-06 ENCOUNTER — Other Ambulatory Visit (HOSPITAL_COMMUNITY): Payer: Self-pay

## 2023-05-06 DIAGNOSIS — E890 Postprocedural hypothyroidism: Secondary | ICD-10-CM

## 2023-05-06 MED ORDER — THYROID 90 MG PO TABS
90.0000 mg | ORAL_TABLET | Freq: Every day | ORAL | 1 refills | Status: DC
Start: 2023-05-06 — End: 2023-10-29
  Filled 2023-05-06 (×2): qty 90, 90d supply, fill #0
  Filled 2023-08-01: qty 90, 90d supply, fill #1

## 2023-05-14 ENCOUNTER — Ambulatory Visit
Admission: RE | Admit: 2023-05-14 | Discharge: 2023-05-14 | Disposition: A | Discharge status: Home / Self Care | Payer: Commercial Managed Care - PPO | Source: Ambulatory Visit | Attending: Nurse Practitioner | Admitting: Nurse Practitioner

## 2023-05-14 DIAGNOSIS — Z1231 Encounter for screening mammogram for malignant neoplasm of breast: Secondary | ICD-10-CM

## 2023-05-15 ENCOUNTER — Other Ambulatory Visit: Payer: Self-pay | Admitting: Nurse Practitioner

## 2023-05-15 DIAGNOSIS — N644 Mastodynia: Secondary | ICD-10-CM

## 2023-06-10 ENCOUNTER — Encounter: Payer: Self-pay | Admitting: Nurse Practitioner

## 2023-06-11 ENCOUNTER — Ambulatory Visit
Admission: RE | Admit: 2023-06-11 | Discharge: 2023-06-11 | Disposition: A | Discharge status: Home / Self Care | Payer: Commercial Managed Care - PPO | Source: Ambulatory Visit | Attending: Nurse Practitioner | Admitting: Nurse Practitioner

## 2023-06-11 ENCOUNTER — Ambulatory Visit
Admission: RE | Admit: 2023-06-11 | Discharge: 2023-06-11 | Disposition: A | Discharge status: Home / Self Care | Payer: Commercial Managed Care - PPO | Source: Ambulatory Visit | Attending: Nurse Practitioner

## 2023-06-11 DIAGNOSIS — Z803 Family history of malignant neoplasm of breast: Secondary | ICD-10-CM | POA: Diagnosis not present

## 2023-06-11 DIAGNOSIS — N644 Mastodynia: Secondary | ICD-10-CM | POA: Diagnosis not present

## 2023-06-17 ENCOUNTER — Encounter: Payer: Self-pay | Admitting: Gastroenterology

## 2023-06-17 ENCOUNTER — Telehealth: Payer: Self-pay | Admitting: Gastroenterology

## 2023-06-17 ENCOUNTER — Other Ambulatory Visit (HOSPITAL_COMMUNITY): Payer: Self-pay

## 2023-06-17 NOTE — Telephone Encounter (Signed)
Inbound call from patient requesting to speak about out of pocket cost for 10/17 colonoscopy. Patient requesting a call back. Please advise, thank you

## 2023-06-20 NOTE — Telephone Encounter (Signed)
PT returning call to get the out of pocket price for her colonscopy on 10/17. Please advise.

## 2023-06-20 NOTE — Telephone Encounter (Signed)
Inbound call from patient returning phone call. Patient requesting a call back. Please advise, thank you.

## 2023-06-20 NOTE — Telephone Encounter (Signed)
Patient calling to f/u on previous note. Please advise. 

## 2023-06-21 ENCOUNTER — Other Ambulatory Visit (HOSPITAL_COMMUNITY): Payer: Self-pay

## 2023-06-24 ENCOUNTER — Other Ambulatory Visit (HOSPITAL_COMMUNITY): Payer: Self-pay

## 2023-06-27 ENCOUNTER — Encounter: Payer: Self-pay | Admitting: Gastroenterology

## 2023-06-27 ENCOUNTER — Ambulatory Visit: Payer: Commercial Managed Care - PPO | Admitting: Gastroenterology

## 2023-06-27 ENCOUNTER — Other Ambulatory Visit (HOSPITAL_COMMUNITY): Payer: Self-pay

## 2023-06-27 VITALS — BP 109/68 | HR 74 | Temp 97.5°F | Resp 20 | Ht 63.0 in | Wt 212.0 lb

## 2023-06-27 DIAGNOSIS — D128 Benign neoplasm of rectum: Secondary | ICD-10-CM | POA: Diagnosis not present

## 2023-06-27 DIAGNOSIS — M069 Rheumatoid arthritis, unspecified: Secondary | ICD-10-CM | POA: Diagnosis not present

## 2023-06-27 DIAGNOSIS — Z1211 Encounter for screening for malignant neoplasm of colon: Secondary | ICD-10-CM | POA: Diagnosis not present

## 2023-06-27 DIAGNOSIS — K635 Polyp of colon: Secondary | ICD-10-CM | POA: Diagnosis not present

## 2023-06-27 DIAGNOSIS — Z800 Family history of malignant neoplasm of digestive organs: Secondary | ICD-10-CM | POA: Diagnosis not present

## 2023-06-27 DIAGNOSIS — K621 Rectal polyp: Secondary | ICD-10-CM | POA: Diagnosis not present

## 2023-06-27 MED ORDER — PRAMOXINE-HC 1-1 % EX CREA
1.0000 | TOPICAL_CREAM | Freq: Two times a day (BID) | CUTANEOUS | 0 refills | Status: DC
Start: 2023-06-27 — End: 2023-07-19
  Filled 2023-06-27: qty 28.4, 7d supply, fill #0
  Filled 2023-06-27 (×2): qty 28.4, 30d supply, fill #0

## 2023-06-27 MED ORDER — SODIUM CHLORIDE 0.9 % IV SOLN: 500.0000 mL | Freq: Once | INTRAVENOUS | Status: AC

## 2023-06-27 NOTE — Progress Notes (Signed)
[facility] History and Physical   Primary Care Physician:  Milus Height, PA   Reason for Procedure:  Family history of colon cancer  Plan:    Screening colonoscopy with possible interventions as needed     HPI: Taylor Bennett is a very pleasant 42 y.o. female here for screening colonoscopy. Denies any nausea, vomiting, abdominal pain, melena or bright red blood per rectum  The risks and benefits as well as alternatives of endoscopic procedure(s) have been discussed and reviewed. All questions answered. The patient agrees to proceed.    Past Medical History:  Diagnosis Date   Back pain    Cervical pain    Cervical spondylolysis    DDD (degenerative disc disease), lumbar    History of IBS    per patient   PCOS (polycystic ovarian syndrome)    Rheumatoid arthritis (HCC)    Scoliosis    Thyroid disease     Past Surgical History:  Procedure Laterality Date   ANKLE SURGERY  2009   ORIF    THYROIDECTOMY     WISDOM TOOTH EXTRACTION      Prior to Admission medications   Medication Sig Start Date End Date Taking? Authorizing Provider  Ascorbic Acid (VITAMIN C PO) Take by mouth daily.   Yes [provider]  Cholecalciferol (VITAMIN D PO) Take 5,000 Units by mouth 2 (two) times daily.    Yes [provider]  etonogestrel-ethinyl estradiol (NUVARING) 0.12-0.015 MG/24HR vaginal ring Insert 1 ring vaginally and leave in place for 3 weeks, remove, and replace with a new ring every 21 days as directed. 04/10/23  Yes   hydroxychloroquine (PLAQUENIL) 200 MG tablet Take 1 tablet (200 mg total) by mouth 2 (two) times daily from Monday to Friday. 04/10/23  Yes Gearldine Bienenstock, PA-C  ibuprofen (ADVIL,MOTRIN) 200 MG tablet Takes 600mg  - 800mg  daily, as needed.   Yes [provider]  loratadine (CLARITIN) 10 MG tablet Take 10 mg by mouth daily.   Yes [provider]  spironolactone (ALDACTONE) 50 MG tablet Take 1 tablet (50 mg total) by  mouth 2 (two) times daily. 04/26/23  Yes   thyroid (NP THYROID) 90 MG tablet Take 1 tablet (90 mg total) by mouth daily. 05/06/23  Yes Carlus Pavlov, MD  ZINC CITRATE-PHYTASE PO Take by mouth.   Yes [provider]  diclofenac Sodium (VOLTAREN) 1 % GEL Apply 2 g topically 4 (four) times daily. Patient taking differently: Apply 2 g topically as needed. 12/11/21   Kerrin Champagne, MD  dicyclomine (BENTYL) 10 MG capsule Take 1 capsule (10 mg total) by mouth 3 (three) times daily as needed for abdominal pain. 12/10/22     meclizine (ANTIVERT) 25 MG tablet Take 1 tablet (25 mg total) by mouth 3 (three) times daily as needed for dizziness. 07/19/15   Leta Baptist, MD  Metaxalone (SKELAXIN PO) Take by mouth as needed.    [provider]  rizatriptan (MAXALT) 10 MG tablet Take 1 tablet by mouth at the onset of headache. May repeat in 2 hours if needed. (max 2 tabs in 24 hours) 07/31/22       Current Outpatient Medications  Medication Sig Dispense Refill   Ascorbic Acid (VITAMIN C PO) Take by mouth daily.     Cholecalciferol (VITAMIN D PO) Take 5,000 Units by mouth 2 (two) times daily.      etonogestrel-ethinyl estradiol (NUVARING) 0.12-0.015 MG/24HR vaginal ring Insert 1 ring vaginally and leave in place for 3  weeks, remove, and replace with a new ring every 21 days as directed. 4 each 5   hydroxychloroquine (PLAQUENIL) 200 MG tablet Take 1 tablet (200 mg total) by mouth 2 (two) times daily from Monday to Friday. 120 tablet 0   ibuprofen (ADVIL,MOTRIN) 200 MG tablet Takes 600mg  - 800mg  daily, as needed.     loratadine (CLARITIN) 10 MG tablet Take 10 mg by mouth daily.     spironolactone (ALDACTONE) 50 MG tablet Take 1 tablet (50 mg total) by mouth 2 (two) times daily. 60 tablet 2   thyroid (NP THYROID) 90 MG tablet Take 1 tablet (90 mg total) by mouth daily. 90 tablet 1   ZINC CITRATE-PHYTASE PO Take by mouth.     diclofenac Sodium (VOLTAREN) 1 % GEL Apply 2 g topically 4 (four)  times daily. (Patient taking differently: Apply 2 g topically as needed.) 300 g 2   dicyclomine (BENTYL) 10 MG capsule Take 1 capsule (10 mg total) by mouth 3 (three) times daily as needed for abdominal pain. 180 capsule 3   meclizine (ANTIVERT) 25 MG tablet Take 1 tablet (25 mg total) by mouth 3 (three) times daily as needed for dizziness. 20 tablet 0   Metaxalone (SKELAXIN PO) Take by mouth as needed.     rizatriptan (MAXALT) 10 MG tablet Take 1 tablet by mouth at the onset of headache. May repeat in 2 hours if needed. (max 2 tabs in 24 hours) 6 tablet 3   Current Facility-Administered Medications  Medication Dose Route Frequency Provider Last Rate Last Admin   0.9 %  sodium chloride infusion  500 mL Intravenous Once Khaleesi Gruel, Eleonore Chiquito, MD        Allergies as of 06/27/2023   (No Known Allergies)    Family History  Problem Relation Age of Onset   Thyroid disease Mother    Arthritis Mother    Colon cancer Father    Diabetes Father    Congestive Heart Failure Father    Alcoholism Father    Neuropathy Father    Arthritis Father    Osteoarthritis Maternal Aunt    Rheum arthritis Paternal Grandmother    Colon cancer Paternal Grandfather    Stomach cancer Paternal Grandfather    Healthy Daughter    Polycystic ovary syndrome Daughter    Esophageal cancer Neg Hx    Rectal cancer Neg Hx     Social History   Socioeconomic History   Marital status: Significant Other    Spouse name: Not on file   Number of children: 2   Years of education: 2 years college   Highest education level: Not on file  Occupational History   Occupation: ED - Admissions  Tobacco Use   Smoking status: Former    Current packs/day: 0.00    Types: Cigarettes    Start date: 2006    Quit date: 2012    Years since quitting: 12.8    Passive exposure: Never   Smokeless tobacco: Never   Tobacco comments:    Social smoker  Vaping Use   Vaping status: Never Used  Substance and Sexual Activity   Alcohol  use: Yes    Comment: wine occ   Drug use: No   Sexual activity: Not on file  Other Topics Concern   Not on file  Social History Narrative   Lives at home with her fiance and children.   Right-handed.   1-2 cup caffeine daily.   Social Determinants of Corporate investment banker  Strain: Not on file  Food Insecurity: Not on file  Transportation Needs: Not on file  Physical Activity: Not on file  Stress: Not on file  Social Connections: Unknown (01/08/2022)   Received from [facility], [facility]   Social Network    Social Network: Not on file  Intimate Partner Violence: Unknown (12/14/2021)   Received from [facility], [facility]   HITS    Physically Hurt: Not on file    Insult or Talk Down To: Not on file    Threaten Physical Harm: Not on file    Scream or Curse: Not on file    Review of Systems:  All other review of systems negative except as mentioned in the HPI.  Physical Exam: Vital signs in last 24 hours: BP 130/74   Pulse 87   Temp (!) 97.5 F (36.4 C) (Temporal)   Resp (!) 24   Ht 5\' 3"  (1.6 m)   Wt 212 lb (96.2 kg)   SpO2 100%   BMI 37.55 kg/m  General:   Alert, NAD Lungs:  Clear .   Heart:  Regular rate and rhythm Abdomen:  Soft, nontender and nondistended. Neuro/Psych:  Alert and cooperative. Normal mood and affect. A and O x 3  Reviewed labs, radiology imaging, old records and pertinent past GI work up  Patient is appropriate for planned procedure(s) and anesthesia in an ambulatory setting   K. Scherry Ran , MD [phone]

## 2023-06-27 NOTE — Patient Instructions (Signed)
YOU HAD AN ENDOSCOPIC PROCEDURE TODAY AT THE [facility]:   Refer to the procedure report that was given to you for any specific questions about what was found during the examination.  If the procedure report does not answer your questions, please call your gastroenterologist to clarify.  If you requested that your care partner not be given the details of your procedure findings, then the procedure report has been included in a sealed envelope for you to review at your convenience later.  YOU SHOULD EXPECT: Some feelings of bloating in the abdomen. Passage of more gas than usual.  Walking can help get rid of the air that was put into your GI tract during the procedure and reduce the bloating. If you had a lower endoscopy (such as a colonoscopy or flexible sigmoidoscopy) you may notice spotting of blood in your stool or on the toilet paper. If you underwent a bowel prep for your procedure, you may not have a normal bowel movement for a few days.  Please Note:  You might notice some irritation and congestion in your nose or some drainage.  This is from the oxygen used during your procedure.  There is no need for concern and it should clear up in a day or so.  SYMPTOMS TO REPORT IMMEDIATELY:  Following lower endoscopy (colonoscopy or flexible sigmoidoscopy):  Excessive amounts of blood in the stool  Significant tenderness or worsening of abdominal pains  Swelling of the abdomen that is new, acute  Fever of 100F or higher    For urgent or emergent issues, a gastroenterologist can be reached at any hour by calling [phone]. Do not use MyChart messaging for urgent concerns.    DIET:  We do recommend a small meal at first, but then you may proceed to your regular diet.  Drink plenty of fluids but you should avoid alcoholic beverages for 24 hours.  MEDICATIONS: Continue present medications. Use Analpram HC Cream 2.5%: Apply externally TWICE daily for 5 days.  Please see handouts  given to you by your recovery nurse: Polyps, Hemorrhoids.  FOLLOW UP: Repeat colonoscopy in 5 years for surveillance.  Thank you for allowing [place] to provide for your healthcare needs today.  ACTIVITY:  You should plan to take it easy for the rest of today and you should NOT DRIVE or use heavy machinery until tomorrow (because of the sedation medicines used during the test).    FOLLOW UP: Our staff will call the number listed on your records the next business day following your procedure.  We will call around 7:15- 8:00 am to check on you and address any questions or concerns that you may have regarding the information given to you following your procedure. If we do not reach you, we will leave a message.     If any biopsies were taken you will be contacted by phone or by letter within the next 1-3 weeks.  Please call us at [phone] if you have not heard about the biopsies in 3 weeks.    SIGNATURES/CONFIDENTIALITY: You and/or your care partner have signed paperwork which will be entered into your electronic medical record.  These signatures attest to the fact that that the information above on your After Visit Summary has been reviewed and is understood.  Full responsibility of the confidentiality of this discharge information lies with you and/or your care-partner.

## 2023-06-27 NOTE — Progress Notes (Signed)
Sedate, gd SR, tolerated procedure well, VSS, report to RN 

## 2023-06-27 NOTE — Progress Notes (Signed)
Called to room to assist during endoscopic procedure.  Patient ID and intended procedure confirmed with present staff. Received instructions for my participation in the procedure from the performing physician.  

## 2023-06-27 NOTE — Op Note (Signed)
[facility] Patient Name: Taylor Bennett Procedure Date: 06/27/2023 8:09 AM MRN: [id number] Endoscopist: Napoleon Form , MD, [id number] Age: 42 Referring MD:  Date of Birth: April 23, 1981 Gender: Female Account #: [id number] Procedure:                Colonoscopy Indications:              Screening in patient at increased risk: Colorectal                            cancer in father before age 76 Medicines:                Monitored Anesthesia Care Procedure:                Pre-Anesthesia Assessment:                           - Prior to the procedure, a History and Physical                            was performed, and patient medications and                            allergies were reviewed. The patient's tolerance of                            previous anesthesia was also reviewed. The risks                            and benefits of the procedure and the sedation                            options and risks were discussed with the patient.                            All questions were answered, and informed consent                            was obtained. Prior Anticoagulants: The patient has                            taken no anticoagulant or antiplatelet agents. ASA                            Grade Assessment: II - A patient with mild systemic                            disease. After reviewing the risks and benefits,                            the patient was deemed in satisfactory condition to                            undergo the procedure.  After obtaining informed consent, the colonoscope                            was passed under direct vision. Throughout the                            procedure, the patient's blood pressure, pulse, and                            oxygen saturations were monitored continuously. The                            Olympus Scope SN [id number] was introduced through the                            anus and advanced  to the the terminal ileum, with                            identification of the appendiceal orifice and IC                            valve. The colonoscopy was performed without                            difficulty. The patient tolerated the procedure                            well. The quality of the bowel preparation was                            good. The ileocecal valve, appendiceal orifice, and                            rectum were photographed. Scope In: 8:24:21 AM Scope Out: 8:39:16 AM Scope Withdrawal Time: 0 hours 12 minutes 19 seconds  Total Procedure Duration: 0 hours 14 minutes 55 seconds  Findings:                 The perianal and digital rectal examinations were                            normal.                           A 3 mm polyp was found in the rectum. The polyp was                            sessile. The polyp was removed with a cold snare.                            Resection and retrieval were complete.                           Non-bleeding external and internal hemorrhoids were  found during retroflexion. The hemorrhoids were                            small. Complications:            No immediate complications. Estimated Blood Loss:     Estimated blood loss was minimal. Impression:               - One 3 mm polyp in the rectum, removed with a cold                            snare. Resected and retrieved.                           - Non-bleeding external and internal hemorrhoids. Recommendation:           - Patient has a contact number available for                            emergencies. The signs and symptoms of potential                            delayed complications were discussed with the                            patient. Return to normal activities tomorrow.                            Written discharge instructions were provided to the                            patient.                           - Resume previous  diet.                           - Continue present medications.                           - Await pathology results.                           - Repeat colonoscopy in 5 years for surveillance.                           - Use Analpram HC Cream 2.5%: Apply externally BID                            for 5 days. Napoleon Form, MD 06/27/2023 8:50:09 AM This report has been signed electronically.

## 2023-06-28 NOTE — Telephone Encounter (Signed)
Left message on f/u call 

## 2023-06-28 NOTE — Progress Notes (Signed)
Office Visit Note  Patient: Taylor Bennett             Date of Birth: [birth year]           MRN: [id number]             PCP: Milus Height, PA Referring: Milus Height, PA Visit Date: 07/11/2023 Occupation: @GUAROCC @  Subjective:  Medication management  History of Present Illness: Taylor Bennett is a 42 y.o. female with seropositive rheumatoid arthritis, osteoarthritis, degenerative disc disease and myofascial pain.  She returns today after her last visit in May 2024.  She states she continues to have generalized pain and discomfort.  She has not noticed any joint swelling.  She has not had any flares of plantar fasciitis since last visit.  She continues to have pain and discomfort in her bilateral trochanteric region.  She has difficulty walking and doing routine activities.  She takes frequent breaks while she is working to walk around.  She has been also doing some stretching exercises.  She has been taking hydroxychloroquine 200 mg p.o. twice daily Monday to Friday without any interruption.    Activities of Daily Living:  Patient reports morning stiffness for periodically hour.   Patient Reports nocturnal pain.  Difficulty dressing/grooming: Reports Difficulty climbing stairs: Reports Difficulty getting out of chair: Reports Difficulty using hands for taps, buttons, cutlery, and/or writing: Reports  Review of Systems  Constitutional:  Positive for fatigue.  HENT:  Positive for mouth dryness. Negative for mouth sores.   Eyes:  Positive for dryness.  Respiratory:  Negative for shortness of breath.   Cardiovascular:  Negative for chest pain and palpitations.  Gastrointestinal:  Positive for constipation and diarrhea. Negative for blood in stool.  Endocrine: Positive for increased urination.  Genitourinary:  Negative for involuntary urination.  Musculoskeletal:  Positive for joint pain, joint pain, joint swelling, myalgias, muscle weakness, morning stiffness, muscle  tenderness and myalgias. Negative for gait problem.  Skin:  Positive for hair loss. Negative for color change, rash and sensitivity to sunlight.  Allergic/Immunologic: Negative for susceptible to infections.  Neurological:  Positive for dizziness and headaches.  Hematological:  Negative for swollen glands.  Psychiatric/Behavioral:  Positive for sleep disturbance. Negative for depressed mood. The patient is not nervous/anxious.     PMFS History:  Patient Active Problem List   Diagnosis Date Noted   Pain in right wrist 10/31/2022   Seropositive rheumatoid arthritis (HCC) 08/29/2022   Primary osteoarthritis of both hands 08/29/2022   Primary osteoarthritis of both feet 08/29/2022   DDD (degenerative disc disease), cervical 08/29/2022   DDD (degenerative disc disease), lumbar 08/29/2022   Chronic bilateral low back pain 02/01/2022   Chronic migraine w/o aura w/o status migrainosus, not intractable 02/01/2022   Paresthesia 11/30/2016   Neck pain 10/23/2016   Left cervical radiculopathy 10/23/2016   Vitamin D deficiency 08/10/2016   PCOS (polycystic ovarian syndrome) 06/16/2013   Migraines 06/16/2013   Hypothyroidism 06/10/2013    Past Medical History:  Diagnosis Date   Back pain    Cervical pain    Cervical spondylolysis    DDD (degenerative disc disease), lumbar    History of IBS    per patient   PCOS (polycystic ovarian syndrome)    Rheumatoid arthritis (HCC)    Scoliosis    Thyroid disease     Family History  Problem Relation Age of Onset   Thyroid disease Mother    Arthritis Mother    Colon cancer Father  Diabetes Father    Congestive Heart Failure Father    Alcoholism Father    Neuropathy Father    Arthritis Father    Osteoarthritis Maternal Aunt    Rheum arthritis Paternal Grandmother    Colon cancer Paternal Grandfather    Stomach cancer Paternal Grandfather    Healthy Daughter    Polycystic ovary syndrome Daughter    Esophageal cancer Neg Hx    Rectal  cancer Neg Hx    Past Surgical History:  Procedure Laterality Date   ANKLE SURGERY  2009   ORIF    COLONOSCOPY     THYROIDECTOMY     WISDOM TOOTH EXTRACTION     Social History   Social History Narrative   Lives at home with her fiance and children.   Right-handed.   1-2 cup caffeine daily.   Immunization History  Administered Date(s) Administered   Influenza-Unspecified 06/03/2014, 06/20/2015   Moderna Sars-Covid-2 Vaccination 10/02/2019, 11/02/2019, 07/09/2020     Objective: Vital Signs: BP 113/76 (BP Location: Right Arm, Patient Position: Sitting, Cuff Size: Normal)   Pulse 79   Resp 14   Ht 5\' 3"  (1.6 m)   Wt 214 lb (97.1 kg)   BMI 37.91 kg/m    Physical Exam Vitals and nursing note reviewed.  Constitutional:      Appearance: She is well-developed.  HENT:     Head: Normocephalic and atraumatic.  Eyes:     Conjunctiva/sclera: Conjunctivae normal.  Cardiovascular:     Rate and Rhythm: Normal rate and regular rhythm.     Heart sounds: Normal heart sounds.  Pulmonary:     Effort: Pulmonary effort is normal.     Breath sounds: Normal breath sounds.  Abdominal:     General: Bowel sounds are normal.     Palpations: Abdomen is soft.  Musculoskeletal:     Cervical back: Normal range of motion.  Lymphadenopathy:     Cervical: No cervical adenopathy.  Skin:    General: Skin is warm and dry.     Capillary Refill: Capillary refill takes less than 2 seconds.  Neurological:     Mental Status: She is alert and oriented to person, place, and time.  Psychiatric:        Behavior: Behavior normal.      Musculoskeletal Exam: She had some limitation of range of motion of the cervical spine.  She had limited painful range of motion of the lumbar spine.    Shoulder joints, elbow joints, wrist joints, MCPs PIPs and DIPs were in good range of motion without any synovitis.  Hip joints and knee joints were in good range of motion.  She had tenderness over bilateral trochanteric  bursa.  No warmth swelling or effusion was noted over the knee joints.  There was no tenderness over ankles or MTPs.  CDAI Exam: CDAI Score: -- Patient Global: --; Provider Global: -- Swollen: --; Tender: -- Joint Exam 07/11/2023   No joint exam has been documented for this visit   There is currently no information documented on the homunculus. Go to the Rheumatology activity and complete the homunculus joint exam.  Investigation: No additional findings.  Imaging: No results found.  Recent Labs: Lab Results  Component Value Date   WBC 11.3 (H) 01/29/2023   HGB 12.1 01/29/2023   PLT 411 (H) 01/29/2023   NA 138 01/29/2023   K 4.4 01/29/2023   CL 104 01/29/2023   CO2 24 01/29/2023   GLUCOSE 77 01/29/2023   BUN 11  01/29/2023   CREATININE 0.81 01/29/2023   BILITOT 0.4 01/29/2023   ALKPHOS 58 07/19/2015   AST 14 01/29/2023   ALT 10 01/29/2023   PROT 6.7 01/29/2023   ALBUMIN 3.6 07/19/2015   CALCIUM 9.1 01/29/2023   GFRAA 104 12/08/2020    Speciality Comments: PLQ EYe Exam: 01/22/2023 WNL @ [facility] f/u 1 year  TB Gold: 07/21/2021 Negative  Procedures:  No procedures performed Allergies: Patient has no known allergies.   Assessment / Plan:     Visit Diagnoses: Seropositive rheumatoid arthritis (HCC) - +14 3 3  eta, elevated sedimentation rate in March 2021, +synovitis on ultrasound: Patient denies any history of flare of rheumatoid arthritis.  No synovitis was noted on the examination.  She has been taking hydroxychloroquine 200 mg p.o. twice daily Monday to Friday without any interruption.  High risk medication use - Plaquenil 200 mg 1 tablet by mouth twice daily monday through friday. PLQ EYE Exam: 01/22/2023 -annual eye examination was advised.  Labs obtained in May 2024 with mildly elevated white cell count and platelets which were reviewed.  Will recheck labs today.  Plan: CBC with Differential/Platelet, COMPLETE METABOLIC PANEL WITH GFR.  Information  for immunization was placed in the AVS.  Primary osteoarthritis of both hands-mild PIP and DIP thickening was noted.  No synovitis was noted.  Chondromalacia patellae, left knee-she had good range of motion of bilateral knee joints without any warmth swelling or effusion.  Trochanteric bursitis of both hips-she had tenderness over bilateral trochanteric bursa and also discomfort on walking over the trochanteric region.  IT band stretches were demonstrated in the office.  Primary osteoarthritis of both feet-proper fitting shoes were advised.  Chronic SI joint pain-she is not remittent discomfort in the SI joint region.  DDD (degenerative disc disease), cervical -she has some limitation with range of motion.  Mild degenerative changes were noted at C3-C4 and C4-C5 without spinal stenosis.  Congenital fusion of C6-C7 vertebral bodie  Other idiopathic scoliosis, thoracic region  Spondylosis of lumbar spine -she has intermittent discomfort in the lumbar spine.  Dr. Roetta Sessions May 2023 transitional lumbosacral anatomy, mild lumbar disc, facet degeneration w/o spinal stenosi, mild left neuroforaminal stenosis at L4-5.  Myofascial pain-she continues to have generalized pain and discomfort.  Plantar fasciitis, bilateral-patient denies any recent recurrence.  Other medical problems are listed as follows:  Hair loss  Hx of migraines  PCOS (polycystic ovarian syndrome)  Vitamin D deficiency  Postoperative hypothyroidism  Family history of rheumatoid arthritis  Orders: Orders Placed This Encounter  Procedures   CBC with Differential/Platelet   COMPLETE METABOLIC PANEL WITH GFR   No orders of the defined types were placed in this encounter.    Follow-Up Instructions: Return in about 5 months (around 12/09/2023) for Rheumatoid arthritis, Osteoarthritis.   Pollyann Savoy, MD  Note - This record has been created using Animal nutritionist.  Chart creation errors have been sought, but  may not always  have been located. Such creation errors do not reflect on  the standard of medical care.

## 2023-07-01 LAB — SURGICAL PATHOLOGY

## 2023-07-08 ENCOUNTER — Telehealth: Payer: Self-pay | Admitting: Pharmacy Technician

## 2023-07-08 NOTE — Telephone Encounter (Signed)
Pharmacy Patient Advocate Encounter   Received notification from Fax that prior authorization for PRAMOSONE 1% is required/requested.   Insurance verification completed.   The patient is insured through [facility] .   Per test claim: PA required; PA submitted to [facility] via CoverMyMeds Key/confirmation #/EOC [id number] Status is pending

## 2023-07-11 ENCOUNTER — Encounter: Payer: Self-pay | Admitting: Rheumatology

## 2023-07-11 ENCOUNTER — Ambulatory Visit: Payer: Commercial Managed Care - PPO | Attending: Rheumatology | Admitting: Rheumatology

## 2023-07-11 ENCOUNTER — Encounter: Payer: Self-pay | Admitting: Gastroenterology

## 2023-07-11 VITALS — BP 113/76 | HR 79 | Resp 14 | Ht 63.0 in | Wt 214.0 lb

## 2023-07-11 DIAGNOSIS — Z8669 Personal history of other diseases of the nervous system and sense organs: Secondary | ICD-10-CM

## 2023-07-11 DIAGNOSIS — M19072 Primary osteoarthritis, left ankle and foot: Secondary | ICD-10-CM

## 2023-07-11 DIAGNOSIS — M5030 Other cervical disc degeneration, unspecified cervical region: Secondary | ICD-10-CM | POA: Diagnosis not present

## 2023-07-11 DIAGNOSIS — Z8261 Family history of arthritis: Secondary | ICD-10-CM

## 2023-07-11 DIAGNOSIS — M47816 Spondylosis without myelopathy or radiculopathy, lumbar region: Secondary | ICD-10-CM

## 2023-07-11 DIAGNOSIS — E282 Polycystic ovarian syndrome: Secondary | ICD-10-CM

## 2023-07-11 DIAGNOSIS — G8929 Other chronic pain: Secondary | ICD-10-CM

## 2023-07-11 DIAGNOSIS — Z79899 Other long term (current) drug therapy: Secondary | ICD-10-CM

## 2023-07-11 DIAGNOSIS — M19041 Primary osteoarthritis, right hand: Secondary | ICD-10-CM

## 2023-07-11 DIAGNOSIS — M4124 Other idiopathic scoliosis, thoracic region: Secondary | ICD-10-CM | POA: Diagnosis not present

## 2023-07-11 DIAGNOSIS — B070 Plantar wart: Secondary | ICD-10-CM

## 2023-07-11 DIAGNOSIS — M059 Rheumatoid arthritis with rheumatoid factor, unspecified: Secondary | ICD-10-CM

## 2023-07-11 DIAGNOSIS — L659 Nonscarring hair loss, unspecified: Secondary | ICD-10-CM

## 2023-07-11 DIAGNOSIS — M7062 Trochanteric bursitis, left hip: Secondary | ICD-10-CM

## 2023-07-11 DIAGNOSIS — E559 Vitamin D deficiency, unspecified: Secondary | ICD-10-CM

## 2023-07-11 DIAGNOSIS — M2242 Chondromalacia patellae, left knee: Secondary | ICD-10-CM | POA: Diagnosis not present

## 2023-07-11 DIAGNOSIS — M722 Plantar fascial fibromatosis: Secondary | ICD-10-CM

## 2023-07-11 DIAGNOSIS — M7061 Trochanteric bursitis, right hip: Secondary | ICD-10-CM

## 2023-07-11 DIAGNOSIS — M7918 Myalgia, other site: Secondary | ICD-10-CM

## 2023-07-11 DIAGNOSIS — M19042 Primary osteoarthritis, left hand: Secondary | ICD-10-CM

## 2023-07-11 DIAGNOSIS — E890 Postprocedural hypothyroidism: Secondary | ICD-10-CM

## 2023-07-11 DIAGNOSIS — M19071 Primary osteoarthritis, right ankle and foot: Secondary | ICD-10-CM | POA: Diagnosis not present

## 2023-07-11 DIAGNOSIS — M533 Sacrococcygeal disorders, not elsewhere classified: Secondary | ICD-10-CM | POA: Diagnosis not present

## 2023-07-11 NOTE — Patient Instructions (Signed)
Vaccines You are taking a medication(s) that can suppress your immune system.  The following immunizations are recommended: Flu annually Covid-19  RSV Td/Tdap (tetanus, diphtheria, pertussis) every 10 years Pneumonia (Prevnar 15 then Pneumovax 23 at least 1 year apart.  Alternatively, can take Prevnar 20 without needing additional dose) Shingrix: 2 doses from 4 weeks to 6 months apart  Please check with your PCP to make sure you are up to date.  Iliotibial Band Syndrome Rehab Ask your health care provider which exercises are safe for you. Do exercises exactly as told by your provider and adjust them as told. It's normal to feel mild stretching, pulling, tightness, or discomfort as you do these exercises. Stop right away if you feel sudden pain or your pain gets a lot worse. Do not begin these exercises until told by your provider. Stretching and range-of-motion exercises These exercises warm up your muscles and joints. They also improve the movement and flexibility of your hip and pelvis. Quadriceps stretch, prone  Lie face down (prone) on a firm surface like a bed or padded floor. Bend your left / right knee. Reach back to hold your ankle or pant leg. If you can't reach your ankle or pant leg, use a belt looped around your foot and grab the belt instead. Gently pull your heel toward your butt. Your knee should not slide out to the side. You should feel a stretch in the front of your thigh and knee, also called the quadriceps. Hold this position for __________ seconds. Repeat __________ times. Complete this exercise __________ times a day. Iliotibial band stretch The iliotibial band is a strip of tissue that runs along the outside of your hip down to your knee. Lie on your side with your left / right leg on top. Bend both knees and grab your left / right ankle. Stretch out your bottom arm to help you balance. Slowly bring your top knee back so your thigh goes behind your back. Slowly lower  your top leg toward the floor until you feel a gentle stretch on the outside of your left / right hip and thigh. If you don't feel a stretch and your knee won't go farther, place the heel of your other foot on top of your knee and pull your knee down toward the floor with your foot. Hold this position for __________ seconds. Repeat __________ times. Complete this exercise __________ times a day. Strengthening exercises These exercises build strength and endurance in your hip and pelvis. Endurance means your muscles can keep working even when they're tired. Straight leg raises, side-lying This exercise strengthens the muscles that rotate the leg at the hip and move it away from your body. These muscles are called hip abductors. Lie on your side with your left / right leg on top. Lie so your head, shoulder, hip, and knee line up. You can bend your bottom knee to help you balance. Roll your hips slightly forward so they're stacked directly over each other. Your left / right knee should face forward. Tense the muscles in your outer thigh and hip. Lift your top leg 4-6 inches (10-15 cm) off the ground. Hold this position for __________ seconds. Slowly lower your leg back down to the starting position. Let your muscles fully relax before doing this exercise again. Repeat __________ times. Complete this exercise __________ times a day. Leg raises, prone This exercise strengthens the muscles that move the hips backward. These muscles are called hip extensors. Lie face down (prone) on  your bed or a firm surface. You can put a pillow under your hips for comfort and to support your lower back. Bend your left / right knee so your foot points straight up toward the ceiling. Keep the other leg straight and behind you. Squeeze your butt muscles. Lift your left / right thigh off the firm surface. Do not let your back arch. Tense your thigh muscle as hard as you can without having more knee pain. Hold this  position for __________ seconds. Slowly lower your leg to the starting position. Allow your leg to relax all the way. Repeat __________ times. Complete this exercise __________ times a day. Hip hike  Stand sideways on a bottom step. Place your feet so that your left / right leg is on the step, and the other foot is hanging off the side. If you need support for balance, hold onto a railing or wall. Keep your knees straight and your abdomen square, meaning your hips are level. Then, lift your left / right hip up toward the ceiling. Slowly let your leg that's hanging off the step lower towards the floor. Your foot should get closer to the ground. Do not lean or bend your knees during this movement. Repeat __________ times. Complete this exercise __________ times a day. This information is not intended to replace advice given to you by your health care provider. Make sure you discuss any questions you have with your health care provider. Document Revised: 11/09/2022 Document Reviewed: 11/09/2022 Elsevier Patient Education  2024 [employer].

## 2023-07-12 LAB — COMPLETE METABOLIC PANEL WITH GFR
AG Ratio: 1.3 (calc) (ref 1.0–2.5)
ALT: 14 U/L (ref 6–29)
AST: 20 U/L (ref 10–30)
Albumin: 3.8 g/dL (ref 3.6–5.1)
Alkaline phosphatase (APISO): 50 U/L (ref 31–125)
BUN: 15 mg/dL (ref 7–25)
CO2: 22 mmol/L (ref 20–32)
Calcium: 9.3 mg/dL (ref 8.6–10.2)
Chloride: 106 mmol/L (ref 98–110)
Creat: 0.86 mg/dL (ref 0.50–0.99)
Globulin: 3 g/dL (ref 1.9–3.7)
Glucose, Bld: 96 mg/dL (ref 65–99)
Potassium: 5.1 mmol/L (ref 3.5–5.3)
Sodium: 138 mmol/L (ref 135–146)
Total Bilirubin: 0.4 mg/dL (ref 0.2–1.2)
Total Protein: 6.8 g/dL (ref 6.1–8.1)
eGFR: 85 mL/min/{1.73_m2} (ref >=60)

## 2023-07-12 LAB — CBC WITH DIFFERENTIAL/PLATELET
Absolute Lymphocytes: 2227 {cells}/uL (ref 850–3900)
Absolute Monocytes: 969 {cells}/uL — ABNORMAL HIGH (ref 200–950)
Basophils Absolute: 51 {cells}/uL (ref 0–200)
Basophils Relative: 0.6 %
Eosinophils Absolute: 102 {cells}/uL (ref 15–500)
Eosinophils Relative: 1.2 %
HCT: 42.1 % (ref 35.0–45.0)
Hemoglobin: 13.8 g/dL (ref 11.7–15.5)
MCH: 31.2 pg (ref 27.0–33.0)
MCHC: 32.8 g/dL (ref 32.0–36.0)
MCV: 95 fL (ref 80.0–100.0)
MPV: 9.3 fL (ref 7.5–12.5)
Monocytes Relative: 11.4 %
Neutro Abs: 5151 {cells}/uL (ref 1500–7800)
Neutrophils Relative %: 60.6 %
Platelets: 387 10*3/uL (ref 140–400)
RBC: 4.43 10*6/uL (ref 3.80–5.10)
RDW: 12.2 % (ref 11.0–15.0)
Total Lymphocyte: 26.2 %
WBC: 8.5 10*3/uL (ref 3.8–10.8)

## 2023-07-12 NOTE — Progress Notes (Signed)
CBC and CMP are normal.

## 2023-07-19 ENCOUNTER — Ambulatory Visit: Payer: Commercial Managed Care - PPO | Admitting: Radiology

## 2023-07-19 ENCOUNTER — Encounter: Payer: Self-pay | Admitting: Radiology

## 2023-07-19 VITALS — BP 102/70 | Ht 62.0 in | Wt 214.0 lb

## 2023-07-19 DIAGNOSIS — N946 Dysmenorrhea, unspecified: Secondary | ICD-10-CM

## 2023-07-19 DIAGNOSIS — E282 Polycystic ovarian syndrome: Secondary | ICD-10-CM

## 2023-07-19 MED ORDER — ANNOVERA 0.013-0.15 MG/24HR VA RING: 1.0000 | VAGINAL_RING | Freq: Once | VAGINAL | 0 refills | Status: AC

## 2023-07-19 NOTE — Progress Notes (Signed)
   Taylor Bennett 04-Aug-1981 [id number]   History:  42 y.o. G4P2 presents as a new patient, transferring from [place]. Hx of PCOS currently on nuvaring, continue to have cramping and headaches with menses. No other gyn concerns.   Gynecologic History Patient's last menstrual period was 07/14/2023 (exact date). Period Cycle (Days): 28 (with Nuvaring) Period Pattern: Regular Menstrual Flow: Light Menstrual Control: Thin pad, Maxi pad, Tampon Dysmenorrhea: (!) Moderate (varies mild to severe) Dysmenorrhea Symptoms: Cramping Contraception/Family planning: NuvaRing vaginal inserts Sexually active: yes Last Pap: 03/2023. Results were: normal Last mammogram: 06/11/23. Results were: normal  Obstetric History OB History  Gravida Para Term Preterm AB Living  4 2       2   SAB IAB Ectopic Multiple Live Births               # Outcome Date GA Lbr Len/2nd Weight Sex Type Anes PTL Lv  4 Gravida           3 Gravida           2 Para           1 Para            Past Medical History:  Diagnosis Date   Back pain    Cervical pain    Cervical spondylolysis    DDD (degenerative disc disease), lumbar    History of IBS    per patient   PCOS (polycystic ovarian syndrome)    Rheumatoid arthritis (HCC)    Scoliosis    Thyroid disease     The following portions of the patient's history were reviewed and updated as appropriate: allergies, current medications, past family history, past medical history, past social history, past surgical history, and problem list.  ROS  Past medical history, past surgical history, family history and social history were all reviewed and documented in the EPIC chart.  Exam:  Vitals:   07/19/23 1419  BP: 102/70  Weight: 214 lb (97.1 kg)  Height: 5\' 2"  (1.575 m)   Body mass index is 39.14 kg/m.  Physical Exam Vitals and nursing note reviewed. Exam conducted with a chaperone present.  Constitutional:      Appearance: Normal appearance. She is overweight.   Cardiovascular:     Rate and Rhythm: Normal rate and regular rhythm.  Pulmonary:     Effort: Pulmonary effort is normal.     Breath sounds: Normal breath sounds.  Abdominal:     General: Abdomen is flat. Bowel sounds are normal.     Palpations: Abdomen is soft.  Neurological:     Mental Status: She is alert.     Raynelle Fanning, CMA present for exam  Assessment/Plan:   1. PCOS (polycystic ovarian syndrome) Will switch to Annovera continuously to control symptoms - Segesterone-Ethinyl Estradiol (ANNOVERA) 0.15-0.013 MG/24HR RING; Place 1 Ring vaginally once for 1 dose. May leave continuously for 1 year or take out once a month for 1 week.  Dispense: 1 each; Refill: 0  2. Dysmenorrhea - Segesterone-Ethinyl Estradiol (ANNOVERA) 0.15-0.013 MG/24HR RING; Place 1 Ring vaginally once for 1 dose. May leave continuously for 1 year or take out once a month for 1 week.  Dispense: 1 each; Refill: 0     Return in about 9 months (around 04/17/2024) for Annual.  Arlie Solomons B WHNP-BC 2:37 PM 07/19/2023

## 2023-07-22 ENCOUNTER — Other Ambulatory Visit (HOSPITAL_COMMUNITY): Payer: Self-pay

## 2023-07-22 MED ORDER — HYDROCORTISONE 0.5 % EX CREA: TOPICAL_CREAM | CUTANEOUS | 0 refills | Status: AC

## 2023-07-22 MED ORDER — LIDOCAINE 3 % EX CREA: TOPICAL_CREAM | CUTANEOUS | 0 refills | Status: AC

## 2023-07-22 NOTE — Telephone Encounter (Signed)
LIDOCAINE -HYDROCORTISONE CREAM (3%-0.5%)   Rx team I sent in what his insurance suggested hopefully you will not need to do a prior authorization I had to send lidocaine in separate from hydrocortisone cream If this needs a prior auth too just try the prior auth on the analpram or they will have to buy Prepration H over the counter  Thanks

## 2023-07-22 NOTE — Telephone Encounter (Addendum)
Prior Authorization form/request asks a question that requires your assistance. Please see the question below and advise accordingly.    Has the patient had a previous trial of preferred step therapy agents: Hydrocortisone-pramoxine 2.5%-1% cream?  However, when I run a test claim for this (generic Analpram) it says:  DRUG EXCLUDED FROM COVERAGE CONSIDER GENERIC ALTERNATIVES: LIDOCAINE -HYDROCORTISONE CREAM (3%-0.5%) OR OTC T OPICALS PRAMOXINE & HYDROCORTISONE CREAM  (OTC PRODUCTS NOT COVERED)  Would you like to try for a PA for the Analpram?

## 2023-07-22 NOTE — Telephone Encounter (Signed)
  Hydrocortisone-pramoxine 2.5%  Give him this if that is what is covered  Thank You

## 2023-07-30 ENCOUNTER — Other Ambulatory Visit (HOSPITAL_COMMUNITY): Payer: Self-pay

## 2023-07-30 NOTE — Telephone Encounter (Signed)
These are OTC, so the pharmacy will likely advise the pt to get them from their shelf, or they may have to order it for them, but the ins will not pay for it. It appears the ins won't cover anything else until the OTC is shown to be ineffective. Hopefully this works for her though. I do see that it was sent to a different pharmacy. Should I advise the [employer] to DC the [place]?

## 2023-08-01 ENCOUNTER — Other Ambulatory Visit (HOSPITAL_COMMUNITY): Payer: Self-pay

## 2023-08-01 ENCOUNTER — Other Ambulatory Visit: Payer: Self-pay | Admitting: Physician Assistant

## 2023-08-01 ENCOUNTER — Other Ambulatory Visit: Payer: Self-pay

## 2023-08-01 MED ORDER — HYDROXYCHLOROQUINE SULFATE 200 MG PO TABS
200.0000 mg | ORAL_TABLET | Freq: Two times a day (BID) | ORAL | 0 refills | Status: DC
Start: 2023-08-01 — End: 2023-12-06
  Filled 2023-08-01: qty 120, 84d supply, fill #0

## 2023-08-01 NOTE — Telephone Encounter (Signed)
Dr Lavon Paganini Insurance issues with  medications that can be purchased as OTC. Can she purchase Recticare with the Lidocaine?

## 2023-08-01 NOTE — Telephone Encounter (Signed)
Last Fill: 04/10/2023  Eye exam: 01/22/2023 WNL    Labs: 07/11/2023 CBC and CMP are normal.   Next Visit: 12/09/2023  Last Visit: 07/11/2023  DX: Seropositive rheumatoid arthritis   Current Dose per office note 07/11/2023: Plaquenil 200 mg 1 tablet by mouth twice daily monday through friday.   Okay to refill Plaquenil?

## 2023-08-01 NOTE — Telephone Encounter (Signed)
Agree with over-the-counter RectiCare with lidocaine along with Preparation H.  Thanks

## 2023-08-01 NOTE — Telephone Encounter (Signed)
Called patient to inform her of Dr. Elana Alm recommendations. Patient states she does not any creams any longer because her symptoms have resolved completely. Informed patient to contact our office if she has any recurrent symptoms. Patient verbalized understanding.

## 2023-08-02 NOTE — Telephone Encounter (Signed)
Correct, they process the insurance and discounts.

## 2023-08-05 ENCOUNTER — Other Ambulatory Visit (HOSPITAL_COMMUNITY): Payer: Self-pay

## 2023-08-09 ENCOUNTER — Other Ambulatory Visit (HOSPITAL_COMMUNITY): Payer: Self-pay

## 2023-09-19 NOTE — Telephone Encounter (Signed)
 Per review of EPIC, Annovera Rx sent on 07/19/23.   Call placed to [facility], spoke with Odaleese. Was Rx is on hold per patient request. [facility] will contact patient to further discuss. New Rx not needed.

## 2023-10-28 ENCOUNTER — Other Ambulatory Visit: Payer: Self-pay | Admitting: Internal Medicine

## 2023-10-28 ENCOUNTER — Encounter: Payer: Self-pay | Admitting: Internal Medicine

## 2023-10-28 ENCOUNTER — Ambulatory Visit: Payer: Commercial Managed Care - PPO | Admitting: Internal Medicine

## 2023-10-28 ENCOUNTER — Other Ambulatory Visit (HOSPITAL_COMMUNITY): Payer: Self-pay

## 2023-10-28 VITALS — BP 120/70 | HR 89 | Ht 62.0 in | Wt 219.4 lb

## 2023-10-28 DIAGNOSIS — E890 Postprocedural hypothyroidism: Secondary | ICD-10-CM | POA: Diagnosis not present

## 2023-10-28 DIAGNOSIS — E282 Polycystic ovarian syndrome: Secondary | ICD-10-CM | POA: Diagnosis not present

## 2023-10-28 DIAGNOSIS — E538 Deficiency of other specified B group vitamins: Secondary | ICD-10-CM | POA: Diagnosis not present

## 2023-10-28 DIAGNOSIS — E559 Vitamin D deficiency, unspecified: Secondary | ICD-10-CM | POA: Diagnosis not present

## 2023-10-28 MED ORDER — RIZATRIPTAN BENZOATE 10 MG PO TABS
10.0000 mg | ORAL_TABLET | ORAL | 0 refills | Status: DC
  Filled 2023-10-28: qty 6, 15d supply, fill #0

## 2023-10-28 NOTE — Progress Notes (Signed)
 Patient ID: Taylor Bennett, female   DOB: 08-22-1981, 43 y.o.   MRN: [id number]   HPI  Taylor Bennett is a 43 y.o.-year-old female, initially referred by her PCP, Redmon, Noelle, PA, returning for follow-up for postsurgical hypothyroidism and hypoparathyroidism and vitamin D deficiency, low vitamin B12. She previously saw Dr. Lucianne Muss.  Last visit with me 1 year ago.  Interim history: She sees Dr.  Corliss Skains for seropositive RA. On PLaquenil. She has episodes of swelling and stiffness in joints. At last visit she had dysfunctional uterine bleeding >> changed from NuvaRing to continuous Annovera. In 04/2021, per insurance preference, she had to change from Armour Thyroid to NP thyroid.  She had nausea and gagging with the NP thyroid >> we could not change back to Armour thyroid >> she now feels that she is tolerating the NP thyroid better.  Postsurgical hypoparathyroidism-mild intermittent: Reviewed calcium and PTH levels: Lab Results  Component Value Date   PTH 12 (L) 04/01/2020   PTH Comment 04/01/2020   PTH CANCELED 10/02/2019   PTH Comment 10/02/2019   PTH 9 (L) 05/08/2019   CALCIUM 9.3 07/11/2023   CALCIUM 9.1 01/29/2023   CALCIUM 9.0 08/29/2022   CALCIUM 8.5 (L) 03/22/2022   CALCIUM 9.1 09/20/2021   CALCIUM 8.8 05/01/2021   CALCIUM 8.9 12/08/2020   CALCIUM 9.6 07/27/2020   CALCIUM 9.2 06/06/2020   CALCIUM 9.1 04/01/2020  05/29/2019: Calcium 8.2 (8.7-10.2)  Magnesium and phosphorus levels were normal: Lab Results  Component Value Date   PHOS 3.2 05/08/2019   Lab Results  Component Value Date   MG 1.7 05/08/2019   Postsurgical hypothyroidism: Reviewed history: Pt. has a h/o Hashimoto's thyroiditis and now has postsurgical hypothyroidism after her total thyroidectomy for neck compression symptoms and a suspicious nodule on 12/19/2015 (final pathology >> benign). Surgery was performed by Dr. Lady Gary.  She could not tolerate Synthroid due to significant fatigue.  She was  on Armour Thyroid 90 mg 5/7 days and 60 mg  2/7 days >> NP 90 mg daily: - in am - fasting - at least 30 min from b'fast - no calcium - no iron - stopped multivitamins - no PPIs - not on Biotin  Reviewed her TFTs: Lab Results  Component Value Date   TSH 2.62 12/14/2022   TSH 6.58 (H) 10/24/2022   TSH 0.66 10/10/2021   TSH 2.10 03/20/2021   TSH 9.61 (H) 09/23/2020   TSH 9.560 (H) 09/23/2020   TSH 5.59 (H) 04/01/2020   TSH 2.320 10/02/2019   TSH 2.42 09/26/2018   TSH 2.49 07/25/2018   FREET4 0.8 12/14/2022   FREET4 0.66 10/24/2022   FREET4 0.80 10/10/2021   FREET4 0.73 03/20/2021   FREET4 0.60 09/23/2020   FREET4 0.78 (L) 09/23/2020   FREET4 0.60 04/01/2020   FREET4 0.78 (L) 10/02/2019   FREET4 0.70 09/26/2018   FREET4 0.69 07/25/2018  05/29/2019: TSH 2.48 05/06/2019: TSH 2.28 01/2016: TSH 2.6  Pt denies: - feeling nodules in neck - hoarseness - choking  She has + FH of thyroid disorders in: mother. No FH of thyroid cancer. No h/o radiation tx to head or neck. No herbal supplements. No Biotin use. No recent steroids use.   PCOS: -She was on metformin- but stopped 2/2 diarrhea in 07/2017 >> she restarted this in 09/2017 but stopped as she did not like how it made her feel -She was on NuvaRing (per Dr. Richardson Dopp) but now off for more than 3 years -Restarted Ovasitol 09/2017 but did not  continue it -Menses are regular -No hirsutism or acne, but continues to have hair loss -On Spironolactone 50 mg daily  HbA1c levels were normal: Lab Results  Component Value Date   HGBA1C 5.2 10/24/2022   HGBA1C 5.2 10/10/2021   HGBA1C 5.2 09/23/2020   HGBA1C CANCELED 09/23/2020   HGBA1C 5.2 10/02/2019   HGBA1C 5.5 09/26/2018   Vitamin D deficiency:  Vitamin D levels were reviewed: Lab Results  Component Value Date   VD25OH 78.83 10/24/2022   VD25OH 65.14 10/10/2021   VD25OH 51.3 09/23/2020   VD25OH 83.9 04/01/2020   VD25OH 87.3 10/02/2019   VD25OH 40.72 05/08/2019   VD25OH  32.22 09/26/2018   VD25OH 24.33 (L) 07/25/2018   VD25OH 50.7 09/26/2017   VD25OH 49.42 02/14/2017   She continues on 5000 units x 2 vitamin D3 daily. Also on vitamin C and Zinc.  Low vitamin B12:  Reviewed vitamin B12 levels: Lab Results  Component Value Date   VITAMINB12 299 10/24/2022   VITAMINB12 853 10/10/2021   VITAMINB12 443 09/23/2020   VITAMINB12 628 09/23/2020   VITAMINB12 503 04/01/2020   VITAMINB12 863 10/02/2019   VITAMINB12 310 09/26/2018   VITAMINB12 550 09/26/2017   She was on a multivitamin with B12 >> now on 1000 mcg B12 >> off now.  She also had a severe vertigo episode in summer 2018 >> on Meclizine.  She also has DDD in cervical spine. Sees Dr. Otelia Sergeant.  She has a family history of RA.  ROS: + See HPI  + joint aches and swelling  I reviewed pt's medications, allergies, PMH, social hx, family hx, and changes were documented in the history of present illness. Otherwise, unchanged from my initial visit note.  Past Medical History:  Diagnosis Date   Back pain    Cervical pain    Cervical spondylolysis    DDD (degenerative disc disease), lumbar    History of IBS    per patient   PCOS (polycystic ovarian syndrome)    Rheumatoid arthritis (HCC)    Scoliosis    Thyroid disease    Past Surgical History:  Procedure Laterality Date   ANKLE SURGERY  2009   ORIF    COLONOSCOPY     THYROIDECTOMY     WISDOM TOOTH EXTRACTION     Social History   Social History   Marital status: Single    Spouse name: N/A   Number of children: N/A   Occupational History   Not on file.   Social History Main Topics   Smoking status: Never Smoker   Smokeless tobacco: Never Used   Alcohol use No   Drug use: No   Current Outpatient Medications on File Prior to Visit  Medication Sig Dispense Refill   Ascorbic Acid (VITAMIN C PO) Take by mouth daily.     Cholecalciferol (VITAMIN D PO) Take 5,000 Units by mouth 2 (two) times daily.      diclofenac Sodium (VOLTAREN)  1 % GEL Apply 2 g topically 4 (four) times daily. (Patient taking differently: Apply 2 g topically as needed.) 300 g 2   dicyclomine (BENTYL) 10 MG capsule Take 1 capsule (10 mg total) by mouth 3 (three) times daily as needed for abdominal pain. 180 capsule 3   etonogestrel-ethinyl estradiol (NUVARING) 0.12-0.015 MG/24HR vaginal ring Insert 1 ring vaginally and leave in place for 3 weeks, remove, and replace with a new ring every 21 days as directed. 4 each 5   hydrocortisone cream 0.5 % Use pea sized amount  rectally external use only twice a day for five days in combination with Lidocaine cream 30 g 0   hydroxychloroquine (PLAQUENIL) 200 MG tablet Take 1 tablet (200 mg total) by mouth 2 (two) times daily from Monday to Friday. 120 tablet 0   ibuprofen (ADVIL,MOTRIN) 200 MG tablet Takes 600mg  - 800mg  daily, as needed.     Lidocaine 3 % CREA Use pea sized amount per rectum external use only in combination with hydrocortisone cream twice a day for 5 days 30 g 0   loratadine (CLARITIN) 10 MG tablet Take 10 mg by mouth daily.     meclizine (ANTIVERT) 25 MG tablet Take 1 tablet (25 mg total) by mouth 3 (three) times daily as needed for dizziness. 20 tablet 0   Metaxalone (SKELAXIN PO) Take by mouth as needed.     spironolactone (ALDACTONE) 50 MG tablet Take 1 tablet (50 mg total) by mouth 2 (two) times daily. 60 tablet 2   thyroid (NP THYROID) 90 MG tablet Take 1 tablet (90 mg total) by mouth daily. 90 tablet 1   ZINC CITRATE-PHYTASE PO Take by mouth.     Current Facility-Administered Medications on File Prior to Visit  Medication Dose Route Frequency Provider Last Rate Last Admin   0.9 %  sodium chloride infusion  500 mL Intravenous Once Nandigam, Eleonore Chiquito, MD       No Known Allergies Family History  Problem Relation Age of Onset   Thyroid disease Mother    Arthritis Mother    Colon cancer Father    Diabetes Father    Congestive Heart Failure Father    Alcoholism Father    Neuropathy Father     Arthritis Father    Osteoarthritis Maternal Aunt    Rheum arthritis Paternal Grandmother    Colon cancer Paternal Grandfather    Stomach cancer Paternal Grandfather    Healthy Daughter    Polycystic ovary syndrome Daughter    Esophageal cancer Neg Hx    Rectal cancer Neg Hx    PE: BP 120/70   Pulse 89   Ht 5\' 2"  (1.575 m)   Wt 219 lb 6.4 oz (99.5 kg)   SpO2 98%   BMI 40.13 kg/m  Wt Readings from Last 10 Encounters:  10/28/23 219 lb 6.4 oz (99.5 kg)  07/19/23 214 lb (97.1 kg)  07/11/23 214 lb (97.1 kg)  06/27/23 212 lb (96.2 kg)  04/26/23 212 lb (96.2 kg)  01/29/23 208 lb 6.4 oz (94.5 kg)  10/31/22 205 lb (93 kg)  10/24/22 209 lb 3.2 oz (94.9 kg)  08/29/22 212 lb 9.6 oz (96.4 kg)  03/22/22 209 lb 9.6 oz (95.1 kg)   Constitutional: overweight, in NAD Eyes:  EOMI, no exophthalmos ENT: no neck masses, no cervical lymphadenopathy Cardiovascular: RRR, No MRG Respiratory: CTA B Musculoskeletal: no deformities Skin:no rashes Neurological: no tremor with outstretched hands  ASSESSMENT: 1. Postsurgical Hypothyroidism  2. Vitamin D deficiency  3.  Low vitamin B12  4.  PCOS  5.  Hypocalcemia due to postsurgical hypoparathyroidism  PLAN:  1. Patient with longstanding hypothyroidism, after total thyroidectomy.  She could not tolerate levothyroxine-we switched to Armour Thyroid, on which she did very well, however, in 04/2021, her insurance stopped covering Armour so we started NP thyroid.  At last visit she had nausea and gagging when taking the tablet.  Tried again to start Armour since last visit but her insurance did not cover it.  She was on the NP thyroid at last visit  and we tried again to change to Armour due to nausea.  She was not able to change to Armour but at this visit she mentions that she got accustomed to taking NP thyroid and she now tolerates it better. - latest thyroid labs reviewed with pt. >> normal: Lab Results  Component Value Date   TSH 2.62  12/14/2022  - she continues on desiccated thyroid extract 90 mg daily - pt feels good on this dose.  She has fatigue and joint aches, which are longstanding.  She gained 10 pounds since last visit.  We discussed that the NP thyroid dose may need to be increased after the results come back due to the weight gain - we discussed about taking the thyroid hormone every day, with water, >30 minutes before breakfast, separated by >4 hours from acid reflux medications, calcium, iron, multivitamins. Pt. is taking it correctly. - will check thyroid tests today: TSH, fT3, and fT4 - If labs are abnormal, she will need to return for repeat TFTs in 1.5 months - RTC in 1 year  2. Vitamin D deficiency -She continues 10000 units vitamin D daily (she takes 2x 5000 units capsules a day) -At last visit vitamin D level was normal (see below) -Will repeat the level today  3. Low vitamin B12 -Diagnosed during investigation for fatigue -She was on a multivitamin before, on B12 1000 mcg daily at last visit, but she was off the supplement for many months before last visit -Last visit, B12 was normal, but on the low end, at 299 -Will recheck her B12 level today  4.  PCOS -She sees OB/GYN, recently transferred from [facility] to [facility] office -Previously on NuvaRing but now changed to continuous Annovera (segesterone-ethynyl estradiol 0.15-0.013 mg per 24-hour ring) -She is also on spironolactone 50 mg daily per OB/GYN -Previously on of Ovasitol and metformin, now off -Latest HbA1C was normal: Lab Results  Component Value Date   HGBA1C 5.2 10/24/2022  -We will repeat her HbA1c today -Latest blood panel was at goal: Lab Results  Component Value Date   CHOL 178 10/24/2022   HDL 55.10 10/24/2022   LDLCALC 97 10/24/2022   TRIG 128.0 10/24/2022   CHOLHDL 3 10/24/2022   5.  Hypocalcemia -Possibly developed after thyroid surgery but also possibly due to vitamin D deficiency -Calcium level was normal at  last check: Lab Results  Component Value Date   CALCIUM 9.3 07/11/2023   PHOS 3.2 05/08/2019   Lab Results  Component Value Date   VD25OH 78.83 10/24/2022  -We will repeat her vitamin D level today  Needs refills for NP.  Orders Placed This Encounter  Procedures   TSH   T4, free   T3, free   Vitamin B12   VITAMIN D 25 Hydroxy (Vit-D Deficiency, Fractures)   Component     Latest Ref Rng 10/28/2023  TSH     mIU/L 1.63   T4,Free(Direct)     0.8 - 1.8 ng/dL 0.9   Triiodothyronine,Free,Serum     2.3 - 4.2 pg/mL 4.1   Vitamin D, 25-Hydroxy     30 - 100 ng/mL 87   Vitamin B12     200 - 1,100 pg/mL 1,970 (H)     Thyroid tests are normal.  Vitamin B12 is higher than target.  Also, vitamin D is increasing and is close to the upper limit of the target range.  I will advise her to reduce the dose of: - B12 to 1000 mcg every  other day - D to 5000 units daily  Carlus Pavlov, MD PhD [facility] Endocrinology

## 2023-10-28 NOTE — Patient Instructions (Signed)
 Please stop at the lab.  Please continue NP 90 mg daily.  Take the thyroid hormone every day, with water, at least 30 minutes before breakfast, separated by at least 4 hours from: - acid reflux medications - calcium - iron - multivitamins  Continue: - Vitamin D 10,000 units daily - Vitamin B12 1000 mcg daily  Please come back for a follow-up appointment in 1 year.

## 2023-10-29 ENCOUNTER — Encounter: Payer: Self-pay | Admitting: Internal Medicine

## 2023-10-29 ENCOUNTER — Other Ambulatory Visit (HOSPITAL_COMMUNITY): Payer: Self-pay

## 2023-10-29 LAB — T3, FREE: T3, Free: 4.1 pg/mL (ref 2.3–4.2)

## 2023-10-29 LAB — VITAMIN B12: Vitamin B-12: 1970 pg/mL — ABNORMAL HIGH (ref 200–1100)

## 2023-10-29 LAB — T4, FREE: Free T4: 0.9 ng/dL (ref 0.8–1.8)

## 2023-10-29 LAB — VITAMIN D 25 HYDROXY (VIT D DEFICIENCY, FRACTURES): Vit D, 25-Hydroxy: 87 ng/mL (ref 30–100)

## 2023-10-29 LAB — TSH: TSH: 1.63 m[IU]/L

## 2023-10-29 MED ORDER — THYROID 90 MG PO TABS
90.0000 mg | ORAL_TABLET | Freq: Every day | ORAL | 3 refills | Status: AC
  Filled 2023-10-29: qty 90, 90d supply, fill #0
  Filled 2023-12-06 – 2024-01-28 (×2): qty 90, 90d supply, fill #1
  Filled 2024-04-27: qty 90, 90d supply, fill #2
  Filled 2024-07-30: qty 90, 90d supply, fill #3

## 2023-10-29 NOTE — Addendum Note (Signed)
 Addended by: Carlus Pavlov on: 10/29/2023 08:46 AM   Modules accepted: Orders

## 2023-11-26 NOTE — Progress Notes (Unsigned)
 Office Visit Note  Patient: Taylor Bennett             Date of Birth: [birth year]           MRN: [id number]             PCP: Milus Height, PA Referring: Milus Height, PA Visit Date: 12/09/2023 Occupation: @GUAROCC @  Subjective:  Intermittent arthralgias   History of Present Illness: Shenekia Riess is a 43 y.o. female with history of seropositive rheumatoid arthritis.  Patient remains on Plaquenil 200 mg 1 tablet by mouth twice daily monday through friday.  She is tolerating Plaquenil without any side effects and has not had any recent gaps in therapy.  Patient states that she has occasional arthralgias particularly in her lower back, hips, both knees.  She takes ibuprofen and Skelaxin as needed for symptomatic relief.  Patient states that she is hoping to start Pilates and will start water exercise again with warmer weather temperatures.  Patient states she was hoping to lose weight which she knows will help her feel better.    Activities of Daily Living:  Patient reports morning stiffness for 2-4 hours.   Patient Reports nocturnal pain.  Difficulty dressing/grooming: Reports Difficulty climbing stairs: Reports Difficulty getting out of chair: Reports Difficulty using hands for taps, buttons, cutlery, and/or writing: Reports  Review of Systems  Constitutional:  Positive for fatigue.  HENT:  Positive for mouth dryness. Negative for mouth sores.   Eyes:  Positive for dryness.  Respiratory:  Negative for shortness of breath.   Cardiovascular:  Negative for chest pain and palpitations.  Gastrointestinal:  Positive for constipation and diarrhea. Negative for blood in stool.  Endocrine: Positive for increased urination.  Genitourinary:  Negative for involuntary urination.  Musculoskeletal:  Positive for joint pain, gait problem, joint pain, joint swelling, myalgias, muscle weakness, morning stiffness, muscle tenderness and myalgias.  Skin:  Positive for color change and hair  loss. Negative for rash and sensitivity to sunlight.  Allergic/Immunologic: Positive for susceptible to infections.  Neurological:  Positive for dizziness and headaches.  Hematological:  Negative for swollen glands.  Psychiatric/Behavioral:  Positive for sleep disturbance. Negative for depressed mood. The patient is not nervous/anxious.     PMFS History:  Patient Active Problem List   Diagnosis Date Noted   Pain in right wrist 10/31/2022   Seropositive rheumatoid arthritis (HCC) 08/29/2022   Primary osteoarthritis of both hands 08/29/2022   Primary osteoarthritis of both feet 08/29/2022   DDD (degenerative disc disease), cervical 08/29/2022   DDD (degenerative disc disease), lumbar 08/29/2022   Chronic bilateral low back pain 02/01/2022   Chronic migraine w/o aura w/o status migrainosus, not intractable 02/01/2022   Paresthesia 11/30/2016   Neck pain 10/23/2016   Left cervical radiculopathy 10/23/2016   Vitamin D deficiency 08/10/2016   PCOS (polycystic ovarian syndrome) 06/16/2013   Migraines 06/16/2013   Hypothyroidism 06/10/2013    Past Medical History:  Diagnosis Date   Back pain    Cervical pain    Cervical spondylolysis    DDD (degenerative disc disease), lumbar    History of IBS    per patient   PCOS (polycystic ovarian syndrome)    Rheumatoid arthritis (HCC)    Scoliosis    Thyroid disease     Family History  Problem Relation Age of Onset   Thyroid disease Mother    Arthritis Mother    Colon cancer Father    Diabetes Father    Congestive Heart Failure  Father    Alcoholism Father    Neuropathy Father    Arthritis Father    Osteoarthritis Maternal Aunt    Rheum arthritis Paternal Grandmother    Colon cancer Paternal Grandfather    Stomach cancer Paternal Grandfather    Healthy Daughter    Polycystic ovary syndrome Daughter    Esophageal cancer Neg Hx    Rectal cancer Neg Hx    Past Surgical History:  Procedure Laterality Date   ANKLE SURGERY  2009    ORIF    COLONOSCOPY     THYROIDECTOMY     WISDOM TOOTH EXTRACTION     Social History   Social History Narrative   Lives at home with her fiance and children.   Right-handed.   1-2 cup caffeine daily.   Immunization History  Administered Date(s) Administered   Influenza-Unspecified 06/03/2014, 06/20/2015   Moderna Sars-Covid-2 Vaccination 10/02/2019, 11/02/2019, 07/09/2020     Objective: Vital Signs: BP 114/77 (BP Location: Left Arm, Patient Position: Sitting, Cuff Size: Large)   Pulse 76   Resp 14   Ht 5\' 3"  (1.6 m)   Wt 219 lb (99.3 kg)   LMP 11/13/2023   BMI 38.79 kg/m    Physical Exam Vitals and nursing note reviewed.  Constitutional:      Appearance: She is well-developed.  HENT:     Head: Normocephalic and atraumatic.  Eyes:     Conjunctiva/sclera: Conjunctivae normal.  Cardiovascular:     Rate and Rhythm: Normal rate and regular rhythm.     Heart sounds: Normal heart sounds.  Pulmonary:     Effort: Pulmonary effort is normal.     Breath sounds: Normal breath sounds.  Abdominal:     General: Bowel sounds are normal.     Palpations: Abdomen is soft.  Musculoskeletal:     Cervical back: Normal range of motion.  Lymphadenopathy:     Cervical: No cervical adenopathy.  Skin:    General: Skin is warm and dry.     Capillary Refill: Capillary refill takes less than 2 seconds.  Neurological:     Mental Status: She is alert and oriented to person, place, and time.  Psychiatric:        Behavior: Behavior normal.      Musculoskeletal Exam: C-spine has limited range of motion.  Thoracic kyphosis.  Limited mobility of the lumbar spine.  Shoulder joints, elbow joints, wrist joints MCPs, PIPs, DIPs have good range of motion with no synovitis.  Complete fist formation bilaterally.  Hip joints have good range of motion with tenderness over bilateral trochanteric bursa.  Knee joints have good range of motion with no warmth or effusion.  Ankle joints have good range of  motion with no tenderness or joint swelling.  CDAI Exam: CDAI Score: -- Patient Global: --; Provider Global: -- Swollen: --; Tender: -- Joint Exam 12/09/2023   No joint exam has been documented for this visit   There is currently no information documented on the homunculus. Go to the Rheumatology activity and complete the homunculus joint exam.  Investigation: No additional findings.  Imaging: No results found.  Recent Labs: Lab Results  Component Value Date   WBC 8.5 07/11/2023   HGB 13.8 07/11/2023   PLT 387 07/11/2023   NA 138 07/11/2023   K 5.1 07/11/2023   CL 106 07/11/2023   CO2 22 07/11/2023   GLUCOSE 96 07/11/2023   BUN 15 07/11/2023   CREATININE 0.86 07/11/2023   BILITOT 0.4 07/11/2023  ALKPHOS 58 07/19/2015   AST 20 07/11/2023   ALT 14 07/11/2023   PROT 6.8 07/11/2023   ALBUMIN 3.6 07/19/2015   CALCIUM 9.3 07/11/2023   GFRAA 104 12/08/2020    Speciality Comments: PLQ EYe Exam: 01/22/2023 WNL @ [facility] f/u 1 year  TB Gold: 07/21/2021 Negative  Procedures:  No procedures performed Allergies: Patient has no known allergies.   Assessment / Plan:     Visit Diagnoses: Seropositive rheumatoid arthritis (HCC) - +14 3 3  eta, elevated sedimentation rate in March 2021, +synovitis on ultrasound: She has no synovitis on examination today.  She is overall clinically been doing well taking Plaquenil 200 mg 1 tablet by mouth twice daily Monday through Friday.  She is tolerating Plaquenil without any side effects and has not had any recent gaps in therapy.  She experiences intermittent discomfort in her lower back, both hips, and both knee joints.  She takes ibuprofen and Skelaxin as needed for symptomatic relief.  She is advised to notify us if she starts to have more frequent flares.  She will remain on Plaquenil as prescribed.  She will follow-up in the office in 5 months or sooner if needed.  High risk medication use - Plaquenil 200 mg 1 tablet by  mouth twice daily monday through friday.  PLQ Eye Exam: 01/22/2023 WNL @ [facility] f/u 1 year  CBC and CMP updated on 07/11/23. Orders for CBC and CMP released today.   - Plan: CBC with Differential/Platelet, Comprehensive metabolic panel with GFR  Primary osteoarthritis of both hands: Mild PIP and DIP thickening.  No active synovitis noted.  Chondromalacia patellae, left knee: No warmth or effusion noted.  Trochanteric bursitis of both hips: Patient has tenderness over bilateral trochanteric bursa.  Discussed the importance of performing home exercises daily.  Primary osteoarthritis of both feet: She experiences intermittent discomfort in the left ankle which she previously had surgery on.  Chronic SI joint pain: Patient continues to have chronic pain in her lower back.  DDD (degenerative disc disease), cervical - Mild degenerative changes were noted at C3-C4 and C4-C5 without spinal stenosis.  Congenital fusion of C6-C7 vertebral bodies.  Slightly limited range of motion without rotation.  Other idiopathic scoliosis, thoracic region: Chronic pain.  Spondylosis of lumbar spine - Dr. Roetta Sessions May 2023 transitional lumbosacral anatomy, mild lumbar disc, facet degeneration w/o spinal stenosi, mild left neuroforaminal stenosis at L4-5.  Chronic pain.  Taking ibuprofen and Skelaxin as needed.  Patient plans on starting to practice Pilates and will be returning to her pool with warmer weather temperatures.   Myofascial pain: She continues to experience intermittent myalgias and muscle tenderness due to myofascial pain.  Discussed the importance of regular exercise and good sleep hygiene.  Other medical conditions are listed as follows:  Hair loss  Hx of migraines  PCOS (polycystic ovarian syndrome)  Vitamin D deficiency  Postoperative hypothyroidism  Family history of rheumatoid arthritis  Orders: Orders Placed This Encounter  Procedures   CBC with  Differential/Platelet   Comprehensive metabolic panel with GFR   Meds ordered this encounter  Medications   hydroxychloroquine (PLAQUENIL) 200 MG tablet    Sig: Take 1 tablet (200 mg total) by mouth 2 (two) times daily from Monday to Friday.    Dispense:  120 tablet    Refill:  0    Please disregard the 30 day supply and fill for 90 day. Thanks!     Follow-Up Instructions: Return in about  5 months (around 05/10/2024) for Rheumatoid arthritis.   Gearldine Bienenstock, PA-C  Note - This record has been created using Dragon software.  Chart creation errors have been sought, but may not always  have been located. Such creation errors do not reflect on  the standard of medical care.

## 2023-11-29 DIAGNOSIS — M549 Dorsalgia, unspecified: Secondary | ICD-10-CM | POA: Diagnosis not present

## 2023-11-29 DIAGNOSIS — Z0000 Encounter for general adult medical examination without abnormal findings: Secondary | ICD-10-CM | POA: Diagnosis not present

## 2023-11-29 DIAGNOSIS — E7800 Pure hypercholesterolemia, unspecified: Secondary | ICD-10-CM | POA: Diagnosis not present

## 2023-11-29 DIAGNOSIS — G43909 Migraine, unspecified, not intractable, without status migrainosus: Secondary | ICD-10-CM | POA: Diagnosis not present

## 2023-11-29 DIAGNOSIS — R42 Dizziness and giddiness: Secondary | ICD-10-CM | POA: Diagnosis not present

## 2023-12-06 ENCOUNTER — Other Ambulatory Visit (HOSPITAL_COMMUNITY): Payer: Self-pay

## 2023-12-06 ENCOUNTER — Other Ambulatory Visit: Payer: Self-pay | Admitting: Rheumatology

## 2023-12-06 MED ORDER — HYDROXYCHLOROQUINE SULFATE 200 MG PO TABS
200.0000 mg | ORAL_TABLET | Freq: Two times a day (BID) | ORAL | 0 refills | Status: DC
  Filled 2023-12-06: qty 40, 20d supply, fill #0

## 2023-12-06 NOTE — Telephone Encounter (Signed)
 Last Fill: 08/01/2023  Eye exam: 01/22/2023 WNL   Labs: 07/11/2023 CBC and CMP are normal.   Next Visit: 12/09/2023  Last Visit: 07/11/2023  [id number] rheumatoid arthritis (HCC)   Current Dose per office note 07/11/2023: Plaquenil 200 mg 1 tablet by mouth twice daily monday through friday   Okay to refill Plaquenil?

## 2023-12-07 ENCOUNTER — Other Ambulatory Visit (HOSPITAL_COMMUNITY): Payer: Self-pay

## 2023-12-08 ENCOUNTER — Other Ambulatory Visit: Payer: Self-pay

## 2023-12-09 ENCOUNTER — Encounter: Payer: Self-pay | Admitting: Physician Assistant

## 2023-12-09 ENCOUNTER — Ambulatory Visit: Payer: Commercial Managed Care - PPO | Attending: Physician Assistant | Admitting: Physician Assistant

## 2023-12-09 ENCOUNTER — Other Ambulatory Visit (HOSPITAL_COMMUNITY): Payer: Self-pay

## 2023-12-09 VITALS — BP 114/77 | HR 76 | Resp 14 | Ht 63.0 in | Wt 219.0 lb

## 2023-12-09 DIAGNOSIS — Z8261 Family history of arthritis: Secondary | ICD-10-CM

## 2023-12-09 DIAGNOSIS — M7061 Trochanteric bursitis, right hip: Secondary | ICD-10-CM

## 2023-12-09 DIAGNOSIS — M4124 Other idiopathic scoliosis, thoracic region: Secondary | ICD-10-CM | POA: Diagnosis not present

## 2023-12-09 DIAGNOSIS — M19071 Primary osteoarthritis, right ankle and foot: Secondary | ICD-10-CM | POA: Diagnosis not present

## 2023-12-09 DIAGNOSIS — M2242 Chondromalacia patellae, left knee: Secondary | ICD-10-CM | POA: Diagnosis not present

## 2023-12-09 DIAGNOSIS — M7918 Myalgia, other site: Secondary | ICD-10-CM

## 2023-12-09 DIAGNOSIS — M059 Rheumatoid arthritis with rheumatoid factor, unspecified: Secondary | ICD-10-CM | POA: Diagnosis not present

## 2023-12-09 DIAGNOSIS — M7062 Trochanteric bursitis, left hip: Secondary | ICD-10-CM

## 2023-12-09 DIAGNOSIS — M5030 Other cervical disc degeneration, unspecified cervical region: Secondary | ICD-10-CM

## 2023-12-09 DIAGNOSIS — M533 Sacrococcygeal disorders, not elsewhere classified: Secondary | ICD-10-CM

## 2023-12-09 DIAGNOSIS — E559 Vitamin D deficiency, unspecified: Secondary | ICD-10-CM

## 2023-12-09 DIAGNOSIS — G8929 Other chronic pain: Secondary | ICD-10-CM

## 2023-12-09 DIAGNOSIS — L659 Nonscarring hair loss, unspecified: Secondary | ICD-10-CM

## 2023-12-09 DIAGNOSIS — E890 Postprocedural hypothyroidism: Secondary | ICD-10-CM

## 2023-12-09 DIAGNOSIS — Z8669 Personal history of other diseases of the nervous system and sense organs: Secondary | ICD-10-CM

## 2023-12-09 DIAGNOSIS — M19042 Primary osteoarthritis, left hand: Secondary | ICD-10-CM

## 2023-12-09 DIAGNOSIS — Z79899 Other long term (current) drug therapy: Secondary | ICD-10-CM

## 2023-12-09 DIAGNOSIS — M19041 Primary osteoarthritis, right hand: Secondary | ICD-10-CM | POA: Diagnosis not present

## 2023-12-09 DIAGNOSIS — M47816 Spondylosis without myelopathy or radiculopathy, lumbar region: Secondary | ICD-10-CM

## 2023-12-09 DIAGNOSIS — E282 Polycystic ovarian syndrome: Secondary | ICD-10-CM

## 2023-12-09 DIAGNOSIS — M19072 Primary osteoarthritis, left ankle and foot: Secondary | ICD-10-CM

## 2023-12-09 MED ORDER — HYDROXYCHLOROQUINE SULFATE 200 MG PO TABS
200.0000 mg | ORAL_TABLET | Freq: Two times a day (BID) | ORAL | 0 refills | Status: DC
  Filled 2023-12-09: qty 120, fill #0
  Filled 2023-12-09: qty 120, 60d supply, fill #0

## 2023-12-10 LAB — CBC WITH DIFFERENTIAL/PLATELET
Absolute Lymphocytes: 2214 {cells}/uL (ref 850–3900)
Absolute Monocytes: 972 {cells}/uL — ABNORMAL HIGH (ref 200–950)
Basophils Absolute: 54 {cells}/uL (ref 0–200)
Basophils Relative: 0.6 %
Eosinophils Absolute: 81 {cells}/uL (ref 15–500)
Eosinophils Relative: 0.9 %
HCT: 40.8 % (ref 35.0–45.0)
Hemoglobin: 13.9 g/dL (ref 11.7–15.5)
MCH: 31 pg (ref 27.0–33.0)
MCHC: 34.1 g/dL (ref 32.0–36.0)
MCV: 91.1 fL (ref 80.0–100.0)
MPV: 9.2 fL (ref 7.5–12.5)
Monocytes Relative: 10.8 %
Neutro Abs: 5679 {cells}/uL (ref 1500–7800)
Neutrophils Relative %: 63.1 %
Platelets: 377 10*3/uL (ref 140–400)
RBC: 4.48 10*6/uL (ref 3.80–5.10)
RDW: 12.7 % (ref 11.0–15.0)
Total Lymphocyte: 24.6 %
WBC: 9 10*3/uL (ref 3.8–10.8)

## 2023-12-10 LAB — COMPREHENSIVE METABOLIC PANEL WITH GFR
AG Ratio: 2 (calc) (ref 1.0–2.5)
ALT: 18 U/L (ref 6–29)
AST: 20 U/L (ref 10–35)
Albumin: 4.7 g/dL (ref 3.6–5.1)
Alkaline phosphatase (APISO): 69 U/L (ref 31–125)
BUN: 12 mg/dL (ref 7–25)
CO2: 25 mmol/L (ref 20–32)
Calcium: 9.7 mg/dL (ref 8.6–10.2)
Chloride: 101 mmol/L (ref 98–110)
Creat: 0.69 mg/dL (ref 0.50–0.99)
Globulin: 2.3 g/dL (ref 1.9–3.7)
Glucose, Bld: 79 mg/dL (ref 65–99)
Potassium: 4.5 mmol/L (ref 3.5–5.3)
Sodium: 136 mmol/L (ref 135–146)
Total Bilirubin: 0.4 mg/dL (ref 0.2–1.2)
Total Protein: 7 g/dL (ref 6.1–8.1)
eGFR: 109 mL/min/{1.73_m2} (ref >=60)

## 2023-12-10 NOTE — Progress Notes (Signed)
 CMP WNL Absolute monocytes are elevated but stable. Rest of CBC WNL.  We will continue to monitor closely.

## 2024-01-09 NOTE — Telephone Encounter (Signed)
Okay to send a note

## 2024-01-10 ENCOUNTER — Encounter: Payer: Self-pay | Admitting: *Deleted

## 2024-01-27 ENCOUNTER — Other Ambulatory Visit: Payer: Self-pay

## 2024-01-28 ENCOUNTER — Other Ambulatory Visit (HOSPITAL_COMMUNITY): Payer: Self-pay

## 2024-01-28 MED ORDER — RIZATRIPTAN BENZOATE 10 MG PO TABS
10.0000 mg | ORAL_TABLET | Freq: Two times a day (BID) | ORAL | 0 refills | Status: DC
Start: 2024-01-28 — End: 2024-03-11
  Filled 2024-01-28: qty 6, 10d supply, fill #0

## 2024-01-29 ENCOUNTER — Other Ambulatory Visit (HOSPITAL_COMMUNITY): Payer: Self-pay

## 2024-01-29 ENCOUNTER — Other Ambulatory Visit: Payer: Self-pay

## 2024-03-11 ENCOUNTER — Other Ambulatory Visit: Payer: Self-pay | Admitting: Physician Assistant

## 2024-03-11 ENCOUNTER — Other Ambulatory Visit (HOSPITAL_COMMUNITY): Payer: Self-pay

## 2024-03-11 MED ORDER — HYDROXYCHLOROQUINE SULFATE 200 MG PO TABS
200.0000 mg | ORAL_TABLET | Freq: Two times a day (BID) | ORAL | 0 refills | Status: DC
  Filled 2024-03-11: qty 120, 84d supply, fill #0

## 2024-03-11 MED ORDER — RIZATRIPTAN BENZOATE 10 MG PO TABS
10.0000 mg | ORAL_TABLET | Freq: Two times a day (BID) | ORAL | 0 refills | Status: AC
  Filled 2024-03-11: qty 6, 15d supply, fill #0

## 2024-03-11 NOTE — Telephone Encounter (Signed)
 Last Fill: 12/09/2023  Eye exam: 01/22/2023 WNL  Contacted patient she had an eye exam end of May and is going to call the have them fax it over.   Labs: 12/09/2023 CMP WNL  Absolute monocytes are elevated but stable. Rest of CBC WNL.   Next Visit: 05/12/2024  Last Visit: 12/09/2023  IK:Dzmnendpupcz rheumatoid arthritis   Current Dose per office note 12/09/2023: Plaquenil  200 mg 1 tablet by mouth twice daily monday through friday.   Okay to refill Plaquenil ?

## 2024-04-21 ENCOUNTER — Ambulatory Visit (INDEPENDENT_AMBULATORY_CARE_PROVIDER_SITE_OTHER): Admitting: Radiology

## 2024-04-21 ENCOUNTER — Encounter: Payer: Self-pay | Admitting: Radiology

## 2024-04-21 ENCOUNTER — Other Ambulatory Visit (HOSPITAL_COMMUNITY)
Admission: RE | Admit: 2024-04-21 | Discharge: 2024-04-21 | Disposition: A | Discharge status: Home / Self Care | Source: Ambulatory Visit | Attending: Radiology | Admitting: Radiology

## 2024-04-21 VITALS — BP 116/78 | HR 89 | Ht 63.25 in | Wt 212.0 lb

## 2024-04-21 DIAGNOSIS — Z01419 Encounter for gynecological examination (general) (routine) without abnormal findings: Secondary | ICD-10-CM | POA: Insufficient documentation

## 2024-04-21 DIAGNOSIS — Z1331 Encounter for screening for depression: Secondary | ICD-10-CM

## 2024-04-21 DIAGNOSIS — E282 Polycystic ovarian syndrome: Secondary | ICD-10-CM

## 2024-04-21 NOTE — Patient Instructions (Signed)
 Preventive Care 45-45 Years Old, Female  Preventive care refers to lifestyle choices and visits with your health care provider that can promote health and wellness. Preventive care visits are also called wellness exams.  What can I expect for my preventive care visit?  Counseling  Your health care provider may ask you questions about your:  Medical history, including:  Past medical problems.  Family medical history.  Pregnancy history.  Current health, including:  Menstrual cycle.  Method of birth control.  Emotional well-being.  Home life and relationship well-being.  Sexual activity and sexual health.  Lifestyle, including:  Alcohol, nicotine or tobacco, and drug use.  Access to firearms.  Diet, exercise, and sleep habits.  Work and work Astronomer.  Sunscreen use.  Safety issues such as seatbelt and bike helmet use.  Physical exam  Your health care provider will check your:  Height and weight. These may be used to calculate your BMI (body mass index). BMI is a measurement that tells if you are at a healthy weight.  Waist circumference. This measures the distance around your waistline. This measurement also tells if you are at a healthy weight and may help predict your risk of certain diseases, such as type 2 diabetes and high blood pressure.  Heart rate and blood pressure.  Body temperature.  Skin for abnormal spots.  What immunizations do I need?    Vaccines are usually given at various ages, according to a schedule. Your health care provider will recommend vaccines for you based on your age, medical history, and lifestyle or other factors, such as travel or where you work.  What tests do I need?  Screening  Your health care provider may recommend screening tests for certain conditions. This may include:  Lipid and cholesterol levels.  Diabetes screening. This is done by checking your blood sugar (glucose) after you have not eaten for a while (fasting).  Pelvic exam and Pap test.  Hepatitis B test.  Hepatitis C  test.  HIV (human immunodeficiency virus) test.  STI (sexually transmitted infection) testing, if you are at risk.  Lung cancer screening.  Colorectal cancer screening.  Mammogram. Talk with your health care provider about when you should start having regular mammograms. This may depend on whether you have a family history of breast cancer.  BRCA-related cancer screening. This may be done if you have a family history of breast, ovarian, tubal, or peritoneal cancers.  Bone density scan. This is done to screen for osteoporosis.  Talk with your health care provider about your test results, treatment options, and if necessary, the need for more tests.  Follow these instructions at home:  Eating and drinking    Eat a diet that includes fresh fruits and vegetables, whole grains, lean protein, and low-fat dairy products.  Take vitamin and mineral supplements as recommended by your health care provider.  Do not drink alcohol if:  Your health care provider tells you not to drink.  You are pregnant, may be pregnant, or are planning to become pregnant.  If you drink alcohol:  Limit how much you have to 0-1 drink a day.  Know how much alcohol is in your drink. In the [place] one drink equals one 12 oz bottle of beer (355 mL), one 5 oz glass of wine (148 mL), or one 1 oz glass of hard liquor (44 mL).  Lifestyle  Brush your teeth every morning and night with fluoride toothpaste. Floss one time each day.  Exercise for at least  30 minutes 5 or more days each week.  Do not use any products that contain nicotine or tobacco. These products include cigarettes, chewing tobacco, and vaping devices, such as e-cigarettes. If you need help quitting, ask your health care provider.  Do not use drugs.  If you are sexually active, practice safe sex. Use a condom or other form of protection to prevent STIs.  If you do not wish to become pregnant, use a form of birth control. If you plan to become pregnant, see your health care provider for a  prepregnancy visit.  Take aspirin only as told by your health care provider. Make sure that you understand how much to take and what form to take. Work with your health care provider to find out whether it is safe and beneficial for you to take aspirin daily.  Find healthy ways to manage stress, such as:  Meditation, yoga, or listening to music.  Journaling.  Talking to a trusted person.  Spending time with friends and family.  Minimize exposure to UV radiation to reduce your risk of skin cancer.  Safety  Always wear your seat belt while driving or riding in a vehicle.  Do not drive:  If you have been drinking alcohol. Do not ride with someone who has been drinking.  When you are tired or distracted.  While texting.  If you have been using any mind-altering substances or drugs.  Wear a helmet and other protective equipment during sports activities.  If you have firearms in your house, make sure you follow all gun safety procedures.  Seek help if you have been physically or sexually abused.  What's next?  Visit your health care provider once a year for an annual wellness visit.  Ask your health care provider how often you should have your eyes and teeth checked.  Stay up to date on all vaccines.  This information is not intended to replace advice given to you by your health care provider. Make sure you discuss any questions you have with your health care provider.  Document Revised: 02/22/2021 Document Reviewed: 02/22/2021  Elsevier Patient Education  2024 [employer].

## 2024-04-21 NOTE — Progress Notes (Signed)
 Taylor Bennett [birth year] [id number]   History:  43 y.o. G4P2 presents for annual exam. Hx of PCOS. Periods have regulated since stopping Annovera , found it too bulky and uncomfortable. Using condoms for [facility].  Gynecologic History Patient's last menstrual period was 04/14/2024 (approximate). Period Cycle (Days): 28 Period Duration (Days): 3-5 Period Pattern: Regular Menstrual Flow: Moderate (passes clots) Menstrual Control: Thin pad, Maxi pad, Tampon Dysmenorrhea: (!) Severe (moderate to severe) Dysmenorrhea Symptoms: Cramping Contraception/Family planning:condoms Sexually active: yes Last Pap: 03/2018. Results were: normal Last mammogram: 09/10/22. Results were: normal Colonoscopy 10/24  Obstetric History OB History  Gravida Para Term Preterm AB Living  4 2    2   SAB IAB Ectopic Multiple Live Births          # Outcome Date GA Lbr Len/2nd Weight Sex Type Anes PTL Lv  4 Gravida           3 Gravida           2 Para           1 Para                04/21/2024    9:15 AM  Depression screen PHQ 2/9  Decreased Interest 0  Down, Depressed, Hopeless 0  PHQ - 2 Score 0     The following portions of the patient's history were reviewed and updated as appropriate: allergies, current medications, past family history, past medical history, past social history, past surgical history, and problem list.  Review of Systems  All other systems reviewed and are negative.   Past medical history, past surgical history, family history and social history were all reviewed and documented in the EPIC chart.  Exam:  Vitals:   04/21/24 0915  BP: 116/78  Pulse: 89  SpO2: 96%  Weight: 212 lb (96.2 kg)  Height: 5' 3.25 (1.607 m)   Body mass index is 37.26 kg/m.  Physical Exam Vitals and nursing note reviewed. Exam conducted with a chaperone present.  Constitutional:      Appearance: Normal appearance. She is normal weight.  HENT:     Head: Normocephalic and atraumatic.  Neck:      Thyroid : No thyroid  mass, thyromegaly or thyroid  tenderness.  Cardiovascular:     Rate and Rhythm: Regular rhythm.     Heart sounds: Normal heart sounds.  Pulmonary:     Effort: Pulmonary effort is normal.     Breath sounds: Normal breath sounds.  Chest:  Breasts:    Breasts are symmetrical.     Right: Normal. No inverted nipple, mass, nipple discharge, skin change or tenderness.     Left: Normal. No inverted nipple, mass, nipple discharge, skin change or tenderness.  Abdominal:     General: Abdomen is flat. Bowel sounds are normal.     Palpations: Abdomen is soft.  Genitourinary:    General: Normal vulva.     Vagina: Normal. No vaginal discharge, bleeding or lesions.     Cervix: Normal. No discharge or lesion.     Uterus: Normal. Not enlarged and not tender.      Adnexa: Right adnexa normal and left adnexa normal.       Right: No mass, tenderness or fullness.         Left: No mass, tenderness or fullness.    Lymphadenopathy:     Upper Body:     Right upper body: No axillary adenopathy.     Left upper body: No axillary adenopathy.  Skin:  General: Skin is warm and dry.  Neurological:     Mental Status: She is alert and oriented to person, place, and time.  Psychiatric:        Mood and Affect: Mood normal.        Thought Content: Thought content normal.        Judgment: Judgment normal.      Taylor Bennett, CMA present for exam  Assessment/Plan:   1. Well woman exam with routine gynecological exam (Primary) - Cytology - PAP( [facility])  2. PCOS (polycystic ovarian syndrome) Continue to monitor cycles and symptoms  3. Depression screen negative   Return in about 1 year (around 04/21/2025) for Annual.  Taylor Bennett WHNP-BC 9:43 AM 04/21/2024

## 2024-04-23 ENCOUNTER — Ambulatory Visit: Payer: Self-pay | Admitting: Radiology

## 2024-04-23 LAB — CYTOLOGY - PAP
Adequacy: ABSENT
Comment: NEGATIVE
Diagnosis: NEGATIVE
High risk HPV: NEGATIVE

## 2024-04-27 ENCOUNTER — Other Ambulatory Visit: Payer: Self-pay | Admitting: Medical Genetics

## 2024-04-28 NOTE — Progress Notes (Unsigned)
 Office Visit Note  Patient: Taylor Bennett             Date of Birth: Dec 25, 1980           MRN: [id number]             PCP: Alvera Reagin, PA Referring: Alvera Reagin, PA Visit Date: 05/12/2024 Occupation: @GUAROCC @  Subjective:  Arthralgias   History of Present Illness: Kourtnei Rauber is a 43 y.o. female with history of seropositive rheumatoid arthritis and osteoarthritis. Patient remains on  Plaquenil  200 mg 1 tablet by mouth twice daily monday through friday.  She is tolerating Plaquenil  without any side effects and has not had any recent gaps in therapy.  Patient reports over the past few weeks she has been more symptomatic than usual.  Patient states that she has been taking ibuprofen sparingly will when she does she has to take it 2-3 times per day for symptomatic relief.  She has noticed increased aching in both hands as well as discomfort in the right wrist joint.  She states she also had swelling in both knee joints last week.  She has been experiencing discomfort in both hips especially at night.  She has also had a recurrence of plantar fasciitis involving the left foot. She remains apprehensive to add on methotrexate due to the risk for immunosuppression and possible side effects.   Activities of Daily Living:  Patient reports morning stiffness for 2 hours  Patient Reports nocturnal pain.  Difficulty dressing/grooming: Denies Difficulty climbing stairs: Reports Difficulty getting out of chair: Reports Difficulty using hands for taps, buttons, cutlery, and/or writing: Reports  Review of Systems  Constitutional:  Negative for fatigue.  HENT:  Positive for mouth dryness. Negative for mouth sores and nose dryness.   Eyes:  Positive for dryness (Refresh). Negative for pain and visual disturbance.  Respiratory:  Negative for cough, hemoptysis, shortness of breath and difficulty breathing.   Cardiovascular:  Negative for chest pain, palpitations, hypertension and  swelling in legs/feet.  Gastrointestinal:  Positive for constipation and diarrhea (IBS). Negative for blood in stool.  Endocrine: Negative for increased urination.  Genitourinary:  Negative for painful urination.  Musculoskeletal:  Positive for joint pain, joint pain, joint swelling and morning stiffness. Negative for myalgias, muscle weakness, muscle tenderness and myalgias.  Skin:  Negative for color change, pallor, rash, hair loss, nodules/bumps, skin tightness, ulcers and sensitivity to sunlight.  Allergic/Immunologic: Negative for susceptible to infections.  Neurological:  Positive for dizziness and headaches. Negative for numbness and weakness.  Hematological:  Negative for swollen glands.  Psychiatric/Behavioral:  Positive for sleep disturbance. Negative for depressed mood. The patient is not nervous/anxious.     PMFS History:  Patient Active Problem List   Diagnosis Date Noted   Pain in right wrist 10/31/2022   Seropositive rheumatoid arthritis (HCC) 08/29/2022   Primary osteoarthritis of both hands 08/29/2022   Primary osteoarthritis of both feet 08/29/2022   DDD (degenerative disc disease), cervical 08/29/2022   DDD (degenerative disc disease), lumbar 08/29/2022   Chronic bilateral low back pain 02/01/2022   Chronic migraine w/o aura w/o status migrainosus, not intractable 02/01/2022   Paresthesia 11/30/2016   Neck pain 10/23/2016   Left cervical radiculopathy 10/23/2016   Vitamin D  deficiency 08/10/2016   PCOS (polycystic ovarian syndrome) 06/16/2013   Migraines 06/16/2013   Hypothyroidism 06/10/2013    Past Medical History:  Diagnosis Date   Back pain    Cervical pain    Cervical spondylolysis  DDD (degenerative disc disease), lumbar    History of IBS    per patient   PCOS (polycystic ovarian syndrome)    Rheumatoid arthritis (HCC)    Scoliosis    Thyroid  disease     Family History  Problem Relation Age of Onset   Thyroid  disease Mother    Arthritis Mother     Colon cancer Father    Diabetes Father    Congestive Heart Failure Father    Alcoholism Father    Neuropathy Father    Arthritis Father    Osteoarthritis Maternal Aunt    Rheum arthritis Paternal Grandmother    Colon cancer Paternal Grandfather    Stomach cancer Paternal Grandfather    Healthy Daughter    Polycystic ovary syndrome Daughter    Esophageal cancer Neg Hx    Rectal cancer Neg Hx    Past Surgical History:  Procedure Laterality Date   ANKLE SURGERY  2009   ORIF    COLONOSCOPY     THYROIDECTOMY     WISDOM TOOTH EXTRACTION     Social History   Social History Narrative   Lives at home with her fiance and children.   Right-handed.   1-2 cup caffeine daily.   Immunization History  Administered Date(s) Administered   Influenza-Unspecified 06/03/2014, 06/20/2015   Moderna Sars-Covid-2 Vaccination 10/02/2019, 11/02/2019, 07/09/2020     Objective: Vital Signs: BP 127/84 (BP Location: Right Arm, Patient Position: Sitting, Cuff Size: Normal)   Pulse 64   Resp 14   Ht 5' 3 (1.6 m)   Wt 216 lb (98 kg)   LMP 05/11/2024 (Approximate)   BMI 38.26 kg/m    Physical Exam Vitals and nursing note reviewed.  Constitutional:      Appearance: She is well-developed.  HENT:     Head: Normocephalic and atraumatic.  Eyes:     Conjunctiva/sclera: Conjunctivae normal.  Cardiovascular:     Rate and Rhythm: Normal rate and regular rhythm.     Heart sounds: Normal heart sounds.  Pulmonary:     Effort: Pulmonary effort is normal.     Breath sounds: Normal breath sounds.  Abdominal:     General: Bowel sounds are normal.     Palpations: Abdomen is soft.  Musculoskeletal:     Cervical back: Normal range of motion.  Lymphadenopathy:     Cervical: No cervical adenopathy.  Skin:    General: Skin is warm and dry.     Capillary Refill: Capillary refill takes less than 2 seconds.  Neurological:     Mental Status: She is alert and oriented to person, place, and time.   Psychiatric:        Behavior: Behavior normal.      Musculoskeletal Exam: C-spine has limited range of motion.  Thoracic kyphosis noted.  Limited mobility of the lumbar spine.  Shoulder joints, elbow joints, wrist joints, MCPs, PIPs, DIPs have good range of motion with no synovitis.  Some tenderness of the right wrist noted.  Complete fist formation bilaterally.  Hip joints have good range of motion with no groin pain.  Tenderness over the trochanteric bursa bilaterally.  Knee joints have good range of motion with no warmth or effusion.  Crepitus of the left knee noted.  Ankle joints have good range of motion with no tenderness or synovitis.  CDAI Exam: CDAI Score: -- Patient Global: --; Provider Global: -- Swollen: --; Tender: -- Joint Exam 05/12/2024   No joint exam has been documented for this visit  There is currently no information documented on the homunculus. Go to the Rheumatology activity and complete the homunculus joint exam.  Investigation: No additional findings.  Imaging: No results found.  Recent Labs: Lab Results  Component Value Date   WBC 9.0 12/09/2023   HGB 13.9 12/09/2023   PLT 377 12/09/2023   NA 136 12/09/2023   K 4.5 12/09/2023   CL 101 12/09/2023   CO2 25 12/09/2023   GLUCOSE 79 12/09/2023   BUN 12 12/09/2023   CREATININE 0.69 12/09/2023   BILITOT 0.4 12/09/2023   ALKPHOS 58 07/19/2015   AST 20 12/09/2023   ALT 18 12/09/2023   PROT 7.0 12/09/2023   ALBUMIN 3.6 07/19/2015   CALCIUM 9.7 12/09/2023   GFRAA 104 12/08/2020    Speciality Comments: PLQ EYe Exam: 01/22/2023 WNL @ [facility] f/u 1 year  TB Gold: 07/21/2021 Negative  Procedures:  No procedures performed Allergies: Doxycycline    Assessment / Plan:     Visit Diagnoses: Seropositive rheumatoid arthritis (HCC) - +14 3 3  eta, elevated sedimentation rate in March 2021, +synovitis on ultrasound: Patient presents today experiencing increased arthralgias and joint  stiffness involving multiple joints.  Her symptoms have been exacerbated over the past few weeks.  She has been taking Plaquenil  200 mg 1 tablet by mouth twice daily Monday through Friday.  She continues to tolerate Plaquenil  without any side effects and has not had any major gaps in therapy.  She has been taking ibuprofen sparingly for symptomatic relief which helps to alleviate her symptoms.  Her hand pain and wrist pain are typically exacerbated by repetitive activities including typing.  She has been experiencing pain on the lateral aspect of both hips consistent with trochanteric bursitis.  Last week she had swelling in both knees but no warmth or effusion was noted today.  Plan to check sed rate and CRP today.  She is apprehensive to add on methotrexate due to possible side effects and immunosuppression.  She plans on continuing to take Plaquenil  as prescribed.  She will notify us  if she develops any new or worsening symptoms.- Plan: Sedimentation rate, C-reactive protein  High risk medication use - Plaquenil  200 mg 1 tablet by mouth twice daily monday through friday.  CBC and CMP updated on 12/09/23. Orders for CBC and CMP released today.  Lipid panel updated on 10/24/22.  PLQ Eye Exam: 01/22/2023 WNL @ [facility] f/u 1 year.  According to the patient she has had an updated Plaquenil  examination so we will call to obtain these records.  - Plan: CBC with Differential/Platelet, Comprehensive metabolic panel with GFR  Primary osteoarthritis of both hands: Patient has been experiencing increased aching and stiffness involving both hands and the right wrist joint.  No synovitis was noted on examination today.  Plan to check sed rate and CRP for further evaluation.   Chondromalacia patellae, left knee: No warmth or effusion noted.  Trochanteric bursitis of both hips: She has tenderness over the trochanteric bursa bilaterally.  Discussed the importance of performing daily stretching  exercises.  She was given a handout of exercises to perform.  Offered referral to physical therapy.  She will notify us  if the symptoms persist or worsen.  Primary osteoarthritis of both feet: She has good range of motion of both ankle joints with no tenderness or synovitis.  Chronic SI joint pain: Intermittent discomfort.   DDD (degenerative disc disease), cervical - Mild degenerative changes were noted at C3-C4 and C4-C5 without spinal  stenosis.  Congenital fusion of C6-C7 vertebral bodies. Limited ROM with lateral rotation.   Other idiopathic scoliosis, thoracic region  Spondylosis of lumbar spine - Dr. Arturo May 2023 transitional lumbosacral anatomy, mild lumbar disc, facet degeneration w/o spinal stenosi, mild left neuroforaminal stenosis at L4-5.  Chronic pain.   Myofascial pain: Patient has been experiencing increased pain for the past few weeks.  She takes Skelaxin  as needed for muscle spasms.   Other medical conditions are listed as follows:  Hair loss  Hx of migraines  PCOS (polycystic ovarian syndrome)  Vitamin D  deficiency  Postoperative hypothyroidism  Family history of rheumatoid arthritis  Orders: Orders Placed This Encounter  Procedures   CBC with Differential/Platelet   Comprehensive metabolic panel with GFR   Sedimentation rate   C-reactive protein   No orders of the defined types were placed in this encounter.   Follow-Up Instructions: Return in about 5 months (around 10/12/2024) for Rheumatoid arthritis, Osteoarthritis.   Waddell CHRISTELLA Craze, PA-C  Note - This record has been created using Dragon software.  Chart creation errors have been sought, but may not always  have been located. Such creation errors do not reflect on  the standard of medical care.

## 2024-05-12 ENCOUNTER — Ambulatory Visit: Attending: Physician Assistant | Admitting: Physician Assistant

## 2024-05-12 ENCOUNTER — Encounter: Payer: Self-pay | Admitting: Physician Assistant

## 2024-05-12 VITALS — BP 127/84 | HR 64 | Resp 14 | Ht 63.0 in | Wt 216.0 lb

## 2024-05-12 DIAGNOSIS — M19041 Primary osteoarthritis, right hand: Secondary | ICD-10-CM | POA: Diagnosis not present

## 2024-05-12 DIAGNOSIS — M2242 Chondromalacia patellae, left knee: Secondary | ICD-10-CM | POA: Diagnosis not present

## 2024-05-12 DIAGNOSIS — M47816 Spondylosis without myelopathy or radiculopathy, lumbar region: Secondary | ICD-10-CM | POA: Diagnosis not present

## 2024-05-12 DIAGNOSIS — M059 Rheumatoid arthritis with rheumatoid factor, unspecified: Secondary | ICD-10-CM

## 2024-05-12 DIAGNOSIS — M19071 Primary osteoarthritis, right ankle and foot: Secondary | ICD-10-CM | POA: Diagnosis not present

## 2024-05-12 DIAGNOSIS — Z8669 Personal history of other diseases of the nervous system and sense organs: Secondary | ICD-10-CM

## 2024-05-12 DIAGNOSIS — E559 Vitamin D deficiency, unspecified: Secondary | ICD-10-CM

## 2024-05-12 DIAGNOSIS — M7062 Trochanteric bursitis, left hip: Secondary | ICD-10-CM

## 2024-05-12 DIAGNOSIS — Z79899 Other long term (current) drug therapy: Secondary | ICD-10-CM

## 2024-05-12 DIAGNOSIS — M4124 Other idiopathic scoliosis, thoracic region: Secondary | ICD-10-CM | POA: Diagnosis not present

## 2024-05-12 DIAGNOSIS — L659 Nonscarring hair loss, unspecified: Secondary | ICD-10-CM

## 2024-05-12 DIAGNOSIS — G8929 Other chronic pain: Secondary | ICD-10-CM

## 2024-05-12 DIAGNOSIS — E890 Postprocedural hypothyroidism: Secondary | ICD-10-CM

## 2024-05-12 DIAGNOSIS — M533 Sacrococcygeal disorders, not elsewhere classified: Secondary | ICD-10-CM

## 2024-05-12 DIAGNOSIS — M7918 Myalgia, other site: Secondary | ICD-10-CM

## 2024-05-12 DIAGNOSIS — M5030 Other cervical disc degeneration, unspecified cervical region: Secondary | ICD-10-CM | POA: Diagnosis not present

## 2024-05-12 DIAGNOSIS — Z8261 Family history of arthritis: Secondary | ICD-10-CM

## 2024-05-12 DIAGNOSIS — M7061 Trochanteric bursitis, right hip: Secondary | ICD-10-CM

## 2024-05-12 DIAGNOSIS — M19042 Primary osteoarthritis, left hand: Secondary | ICD-10-CM

## 2024-05-12 DIAGNOSIS — M19072 Primary osteoarthritis, left ankle and foot: Secondary | ICD-10-CM

## 2024-05-12 DIAGNOSIS — E282 Polycystic ovarian syndrome: Secondary | ICD-10-CM

## 2024-05-12 NOTE — Patient Instructions (Signed)
 Hip Bursitis Rehab Ask your health care provider which exercises are safe for you. Do exercises exactly as told by your health care provider and adjust them as directed. It is normal to feel mild stretching, pulling, tightness, or discomfort as you do these exercises. Stop right away if you feel sudden pain or your pain gets worse. Do not begin these exercises until told by your health care provider. Stretching exercise This exercise warms up your muscles and joints and improves the movement and flexibility of your hip. This exercise also helps to relieve pain and stiffness. Iliotibial band stretch An iliotibial band is a strong band of muscle tissue that runs from the outer side of your hip to the outer side of your thigh and knee. Lie on your side with your left / right leg in the top position. Bend your left / right knee and grab your ankle. Stretch out your bottom arm to help you balance. Slowly bring your knee back so your thigh is slightly behind your body. Slowly lower your knee toward the floor until you feel a gentle stretch on the outside of your left / right thigh. If you do not feel a stretch and your knee will not lower more toward the floor, place the heel of your other foot on top of your knee and pull your knee down toward the floor with your foot. Hold this position for __________ seconds. Slowly return to the starting position. Repeat __________ times. Complete this exercise __________ times a day. Strengthening exercises These exercises build strength and endurance in your hip and pelvis. Endurance is the ability to use your muscles for a long time, even after they get tired. Bridge This exercise strengthens the muscles that move your thigh backward (hip extensors). Lie on your back on a firm surface with your knees bent and your feet flat on the floor. Tighten your buttocks muscles and lift your buttocks off the floor until your trunk is level with your thighs. Do not arch your  back. You should feel the muscles working in your buttocks and the back of your thighs. If you do not feel these muscles, slide your feet 1-2 inches (2.5-5 cm) farther away from your buttocks. If this exercise is too easy, try doing it with your arms crossed over your chest. Hold this position for __________ seconds. Slowly lower your hips to the starting position. Let your muscles relax completely after each repetition. Repeat __________ times. Complete this exercise __________ times a day. Squats This exercise strengthens the muscles in front of your thigh and knee (quadriceps). Stand in front of a table, with your feet and knees pointing straight ahead. You may rest your hands on the table for balance but not for support. Slowly bend your knees and lower your hips like you are going to sit in a chair. Keep your weight over your heels, not over your toes. Keep your lower legs upright so they are parallel with the table legs. Do not let your hips go lower than your knees. Do not bend lower than told by your health care provider. If your hip pain increases, do not bend as low. Hold the squat position for __________ seconds. Slowly push with your legs to return to standing. Do not use your hands to pull yourself to standing. Repeat __________ times. Complete this exercise __________ times a day. Hip hike  Stand sideways on a bottom step. Stand on your left / right leg with your other foot unsupported next to  the step. You can hold on to the railing or wall for balance if needed. Keep your knees straight and your torso square. Then lift your left / right hip up toward the ceiling. Hold this position for __________ seconds. Slowly let your left / right hip lower toward the floor, past the starting position. Your foot should get closer to the floor. Do not lean or bend your knees. Repeat __________ times. Complete this exercise __________ times a day. Single leg stand This exercise increases  your balance. Without shoes, stand near a railing or in a doorway. You may hold on to the railing or door frame as needed for balance. Squeeze your left / right buttock muscles, then lift up your other foot. Do not let your left / right hip push out to the side. It is helpful to stand in front of a mirror for this exercise so you can watch your hip. Hold this position for __________ seconds. Repeat __________ times. Complete this exercise __________ times a day. This information is not intended to replace advice given to you by your health care provider. Make sure you discuss any questions you have with your health care provider. Document Revised: 08/09/2021 Document Reviewed: 08/09/2021 Elsevier Patient Education  2024 [employer].

## 2024-05-13 ENCOUNTER — Ambulatory Visit: Payer: Self-pay | Admitting: Physician Assistant

## 2024-05-13 LAB — CBC WITH DIFFERENTIAL/PLATELET
Absolute Lymphocytes: 2144 {cells}/uL (ref 850–3900)
Absolute Monocytes: 880 {cells}/uL (ref 200–950)
Basophils Absolute: 43 {cells}/uL (ref 0–200)
Basophils Relative: 0.6 %
Eosinophils Absolute: 99 {cells}/uL (ref 15–500)
Eosinophils Relative: 1.4 %
HCT: 39.7 % (ref 35.0–45.0)
Hemoglobin: 13 g/dL (ref 11.7–15.5)
MCH: 30.3 pg (ref 27.0–33.0)
MCHC: 32.7 g/dL (ref 32.0–36.0)
MCV: 92.5 fL (ref 80.0–100.0)
MPV: 9.3 fL (ref 7.5–12.5)
Monocytes Relative: 12.4 %
Neutro Abs: 3933 {cells}/uL (ref 1500–7800)
Neutrophils Relative %: 55.4 %
Platelets: 366 Thousand/uL (ref 140–400)
RBC: 4.29 Million/uL (ref 3.80–5.10)
RDW: 12 % (ref 11.0–15.0)
Total Lymphocyte: 30.2 %
WBC: 7.1 Thousand/uL (ref 3.8–10.8)

## 2024-05-13 LAB — COMPREHENSIVE METABOLIC PANEL WITH GFR
AG Ratio: 1.7 (calc) (ref 1.0–2.5)
ALT: 14 U/L (ref 6–29)
AST: 17 U/L (ref 10–35)
Albumin: 4.2 g/dL (ref 3.6–5.1)
Alkaline phosphatase (APISO): 65 U/L (ref 31–125)
BUN: 13 mg/dL (ref 7–25)
CO2: 26 mmol/L (ref 20–32)
Calcium: 9 mg/dL (ref 8.6–10.2)
Chloride: 105 mmol/L (ref 98–110)
Creat: 0.61 mg/dL (ref 0.50–0.99)
Globulin: 2.5 g/dL (ref 1.9–3.7)
Glucose, Bld: 78 mg/dL (ref 65–99)
Potassium: 4.2 mmol/L (ref 3.5–5.3)
Sodium: 138 mmol/L (ref 135–146)
Total Bilirubin: 0.4 mg/dL (ref 0.2–1.2)
Total Protein: 6.7 g/dL (ref 6.1–8.1)
eGFR: 112 mL/min/1.73m2 (ref >=60)

## 2024-05-13 LAB — SEDIMENTATION RATE: Sed Rate: 28 mm/h — ABNORMAL HIGH (ref 0–20)

## 2024-05-13 LAB — C-REACTIVE PROTEIN: CRP: 11.4 mg/L — ABNORMAL HIGH (ref <8.0)

## 2024-05-13 NOTE — Progress Notes (Signed)
 CBC and CMP WNL ESR is borderline elevated. CRP pending.

## 2024-05-13 NOTE — Progress Notes (Signed)
 CRP remains slightly elevated--please clarify if she should like to schedule an ultrasound of both hands to assess for synovitis.

## 2024-06-08 ENCOUNTER — Other Ambulatory Visit (HOSPITAL_COMMUNITY): Payer: Self-pay

## 2024-06-08 ENCOUNTER — Other Ambulatory Visit: Payer: Self-pay | Admitting: Physician Assistant

## 2024-06-08 ENCOUNTER — Other Ambulatory Visit: Payer: Self-pay

## 2024-06-08 MED ORDER — HYDROXYCHLOROQUINE SULFATE 200 MG PO TABS
200.0000 mg | ORAL_TABLET | Freq: Two times a day (BID) | ORAL | 0 refills | Status: DC
  Filled 2024-06-08: qty 120, 84d supply, fill #0

## 2024-06-08 NOTE — Telephone Encounter (Signed)
 Last Fill: 03/11/2024  Eye exam: 02/06/2024 WNL   Labs: 05/12/2024 CBC and CMP WNL ESR is borderline elevated.  Next Visit: 10/20/2024  Last Visit: 05/12/2024  IK:Dzmnendpupcz rheumatoid arthritis   Current Dose per office note 05/12/2024: Plaquenil  200 mg 1 tablet by mouth twice daily monday through friday.   Okay to refill Plaquenil ?

## 2024-06-09 ENCOUNTER — Other Ambulatory Visit (HOSPITAL_COMMUNITY): Payer: Self-pay

## 2024-07-08 ENCOUNTER — Encounter: Payer: Self-pay | Admitting: Internal Medicine

## 2024-07-09 ENCOUNTER — Other Ambulatory Visit: Payer: Self-pay | Admitting: Physician Assistant

## 2024-07-09 ENCOUNTER — Other Ambulatory Visit: Payer: Self-pay

## 2024-07-09 DIAGNOSIS — R490 Dysphonia: Secondary | ICD-10-CM

## 2024-07-09 DIAGNOSIS — R499 Unspecified voice and resonance disorder: Secondary | ICD-10-CM

## 2024-07-09 DIAGNOSIS — Z1231 Encounter for screening mammogram for malignant neoplasm of breast: Secondary | ICD-10-CM

## 2024-07-09 NOTE — Telephone Encounter (Signed)
 We can refer the patient to ENT for further evaluation.   Recommend undergoing cognitive testing with PCP to assess brain fog and memory change.

## 2024-07-09 NOTE — Telephone Encounter (Signed)
 We do not prescribe GLP-1 patches.  I do not see a contraindication to trying this though-may want to discuss with PCP.

## 2024-07-09 NOTE — Progress Notes (Signed)
 Referral placed to ENT per Waddell Craze

## 2024-07-10 ENCOUNTER — Ambulatory Visit
Admission: RE | Admit: 2024-07-10 | Discharge: 2024-07-10 | Disposition: A | Discharge status: Home / Self Care | Source: Ambulatory Visit | Attending: Physician Assistant | Admitting: Physician Assistant

## 2024-07-10 DIAGNOSIS — Z1231 Encounter for screening mammogram for malignant neoplasm of breast: Secondary | ICD-10-CM

## 2024-07-14 ENCOUNTER — Ambulatory Visit (INDEPENDENT_AMBULATORY_CARE_PROVIDER_SITE_OTHER): Admitting: Otolaryngology

## 2024-07-14 DIAGNOSIS — G248 Other dystonia: Secondary | ICD-10-CM

## 2024-07-14 DIAGNOSIS — Z87891 Personal history of nicotine dependence: Secondary | ICD-10-CM

## 2024-07-14 DIAGNOSIS — K219 Gastro-esophageal reflux disease without esophagitis: Secondary | ICD-10-CM

## 2024-07-14 DIAGNOSIS — J383 Other diseases of vocal cords: Secondary | ICD-10-CM

## 2024-07-14 MED ORDER — PANTOPRAZOLE SODIUM 40 MG PO TBEC: 40.0000 mg | DELAYED_RELEASE_TABLET | Freq: Two times a day (BID) | ORAL | 1 refills | Status: AC

## 2024-07-14 NOTE — Progress Notes (Signed)
 Reason for Consult:hoarseness Referring Physician: Dr Alvera Slough Taylor Bennett is an 43 y.o. female.  HPI: She is here for evaluation of hoarseness.  She has had this since the spring of this year.  She does not recall any new medications.  She does have rheumatoid arthritis.  She has had a thyroid  removed in 2017 but did not have hoarseness after that.  She has no dysphagia or dyne aphasia but does have some feeling like sometimes her food is going over a hill.  She has normal voice in the morning and then toward the afternoon she starts having worse and worse voice.  Sometimes she loses her voice entirely.  She has no pain.  Past Medical History:  Diagnosis Date   Back pain    Cervical pain    Cervical spondylolysis    DDD (degenerative disc disease), lumbar    History of IBS    per patient   PCOS (polycystic ovarian syndrome)    Rheumatoid arthritis (HCC)    Scoliosis    Thyroid  disease     Past Surgical History:  Procedure Laterality Date   ANKLE SURGERY  2009   ORIF    COLONOSCOPY     THYROIDECTOMY     WISDOM TOOTH EXTRACTION      Family History  Problem Relation Age of Onset   Thyroid  disease Mother    Arthritis Mother    Colon cancer Father    Diabetes Father    Congestive Heart Failure Father    Alcoholism Father    Neuropathy Father    Arthritis Father    Osteoarthritis Maternal Aunt    Rheum arthritis Paternal Grandmother    Colon cancer Paternal Grandfather    Stomach cancer Paternal Grandfather    Healthy Daughter    Polycystic ovary syndrome Daughter    Esophageal cancer Neg Hx    Rectal cancer Neg Hx     Social History:  reports that she quit smoking about 13 years ago. Her smoking use included cigarettes. She started smoking about 19 years ago. She has never been exposed to tobacco smoke. She has never used smokeless tobacco. She reports current alcohol use. She reports that she does not use drugs.  Allergies:  Allergies  Allergen Reactions    Doxycycline Nausea Only    Medications: I have reviewed the patient's current medications.  No results found for this or any previous visit (from the past 48 hours).  No results found.  ROS There were no vitals taken for this visit. Physical Exam Constitutional:      Appearance: Normal appearance.  HENT:     Head: Normocephalic and atraumatic.     Right Ear: Tympanic membrane is without lesions and middle ear aerated, ear canal and external ear normal.     Left Ear: Tympanic membrane is without lesions and middle ear aerated, ear canal and external ear normal.     Nose: Nose normal. Turbinates with mild hypertrophy, No significant swelling or masses.     Oral cavity/oropharynx: Mucous membranes are moist. No lesions or masses    Larynx: normal voice. Mirror attempted without success    Eyes:     Extraocular Movements: Extraocular movements intact.     Conjunctiva/sclera: Conjunctivae normal.     Pupils: Pupils are equal, round, and reactive to light.  Cardiovascular:     Rate and Rhythm: Normal rate.  Pulmonary:     Effort: Pulmonary effort is normal.  Musculoskeletal:     Cervical back:  Normal range of motion and neck supple. No rigidity.  Lymphadenopathy:     Cervical: No cervical adenopathy or masses.salivary glands without lesions. .  Neurological:     Mental Status: He is alert. CN 2-12 intact. No nystagmus   Flexible fibroptic laryngoscopy  Patient was informed of risks, benefits, and options. All questions answered. Consent obtained.   The scope was passed through the nose and tracked into the nasopharynx. The nasopharynx without lesions or masses. The scope was positioned over the base of tongue and epiglottis. There was no obvious lesions or significant swelling and any of the laryngeal or pharyngeal structures. The vocal cords are moving well but they do have a obvious bowing bilaterally.  There is no lesions on the vocal cords.  There is no significant erythema of  either one of the arytenoids.  The subglottis has minimal visualization but no lesions identified. The scope was removed without difficulty and patient tolerated well.     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  She has has a normal voice in the morning and then by the end of the day she is most likely getting the muscle tension.  I think reflux treatment initially would be the most appropriate thing and then we can try some other modalities as time passes.  She agrees.  Protonix will be called in.Taylor Bennett [name] 07/14/2024, 4:35 PM

## 2024-07-15 ENCOUNTER — Other Ambulatory Visit: Payer: Self-pay | Admitting: Medical Genetics

## 2024-07-15 ENCOUNTER — Other Ambulatory Visit: Payer: Self-pay | Admitting: Physician Assistant

## 2024-07-15 DIAGNOSIS — R498 Other voice and resonance disorders: Secondary | ICD-10-CM | POA: Diagnosis not present

## 2024-07-15 DIAGNOSIS — E282 Polycystic ovarian syndrome: Secondary | ICD-10-CM | POA: Diagnosis not present

## 2024-07-15 DIAGNOSIS — Z006 Encounter for examination for normal comparison and control in clinical research program: Secondary | ICD-10-CM

## 2024-07-15 DIAGNOSIS — R928 Other abnormal and inconclusive findings on diagnostic imaging of breast: Secondary | ICD-10-CM

## 2024-07-15 DIAGNOSIS — M069 Rheumatoid arthritis, unspecified: Secondary | ICD-10-CM | POA: Diagnosis not present

## 2024-07-15 DIAGNOSIS — E039 Hypothyroidism, unspecified: Secondary | ICD-10-CM | POA: Diagnosis not present

## 2024-07-22 ENCOUNTER — Institutional Professional Consult (permissible substitution) (INDEPENDENT_AMBULATORY_CARE_PROVIDER_SITE_OTHER): Admitting: Adult Health

## 2024-07-24 ENCOUNTER — Other Ambulatory Visit

## 2024-07-25 ENCOUNTER — Ambulatory Visit
Admission: RE | Admit: 2024-07-25 | Discharge: 2024-07-25 | Disposition: A | Source: Ambulatory Visit | Attending: Physician Assistant | Admitting: Physician Assistant

## 2024-07-25 DIAGNOSIS — N6325 Unspecified lump in the left breast, overlapping quadrants: Secondary | ICD-10-CM | POA: Diagnosis not present

## 2024-07-25 DIAGNOSIS — R928 Other abnormal and inconclusive findings on diagnostic imaging of breast: Secondary | ICD-10-CM

## 2024-07-28 ENCOUNTER — Other Ambulatory Visit

## 2024-07-30 ENCOUNTER — Encounter (INDEPENDENT_AMBULATORY_CARE_PROVIDER_SITE_OTHER): Payer: Self-pay | Admitting: Adult Health

## 2024-07-30 ENCOUNTER — Ambulatory Visit (INDEPENDENT_AMBULATORY_CARE_PROVIDER_SITE_OTHER): Admitting: Adult Health

## 2024-07-30 VITALS — BP 113/77 | HR 84 | Temp 98.2°F | Ht 62.0 in | Wt 207.0 lb

## 2024-07-30 DIAGNOSIS — E669 Obesity, unspecified: Secondary | ICD-10-CM | POA: Diagnosis not present

## 2024-07-30 DIAGNOSIS — E559 Vitamin D deficiency, unspecified: Secondary | ICD-10-CM | POA: Diagnosis not present

## 2024-07-30 DIAGNOSIS — E282 Polycystic ovarian syndrome: Secondary | ICD-10-CM | POA: Diagnosis not present

## 2024-07-30 DIAGNOSIS — G43709 Chronic migraine without aura, not intractable, without status migrainosus: Secondary | ICD-10-CM

## 2024-07-30 DIAGNOSIS — E890 Postprocedural hypothyroidism: Secondary | ICD-10-CM

## 2024-07-30 DIAGNOSIS — Z6837 Body mass index (BMI) 37.0-37.9, adult: Secondary | ICD-10-CM | POA: Diagnosis not present

## 2024-07-30 DIAGNOSIS — Z0000 Encounter for general adult medical examination without abnormal findings: Secondary | ICD-10-CM

## 2024-07-30 NOTE — Progress Notes (Signed)
 Office: [phone]  /  Fax: [phone]   Initial Visit    Taylor Bennett was seen in clinic today to evaluate for obesity. She is interested in losing weight to improve overall health and reduce the risk of weight related complications. She presents today to review program treatment options, initial physical assessment, and evaluation.     She was referred by: PCP  When asked what else they would like to accomplish? She states: Adopt a healthier eating pattern and lifestyle, Improve energy levels and physical activity, Improve existing medical conditions, Improve quality of life, and Current Weight 207 lbs, goal weight 150-160 lbs  When asked how has your weight affected you? She states: Contributed to medical problems, Contributed to orthopedic problems or mobility issues, Having fatigue, and Having poor endurance  Weight history: Steady weight gain over the last 3-4 years  Highest weight: 219 lbs  Some associated conditions: PCOS and Vitamin D  Deficiency  Contributing factors: moderate to high levels of stress, reduced physical activity, hectic pace of life, and need for convenient foods  Weight promoting medications identified: None  Prior weight loss attempts: Low Carb  Current nutrition plan: None  Current level of physical activity: None  Current or previous pharmacotherapy: None and Is interested in pharmacotherapy  Response to medication: Never tried medications   Past medical history includes:   Past Medical History:  Diagnosis Date   Back pain    Cervical pain    Cervical spondylolysis    DDD (degenerative disc disease), lumbar    History of IBS    per patient   PCOS (polycystic ovarian syndrome)    Rheumatoid arthritis (HCC)    Scoliosis    Thyroid  disease      Objective    BP 113/77   Pulse 84   Temp 98.2 F (36.8 C)   Ht 5' 2 (1.575 m)   Wt 207 lb (93.9 kg)   SpO2 99%   BMI 37.86 kg/m  She was weighed on the bioimpedance scale:  Body mass index is 37.86 kg/m.  Body Fat%:45.2, Visceral Fat Rating:12, Weight trend over the last 12 months: Increasing  General:  Alert, oriented and cooperative. Patient is in no acute distress.  Respiratory: Normal respiratory effort, no problems with respiration noted   Gait: able to ambulate independently  Mental Status: Normal mood and affect. Normal behavior. Normal judgment and thought content.   DIAGNOSTIC DATA REVIEWED:  BMET    Component Value Date/Time   NA 138 05/12/2024 1527   K 4.2 05/12/2024 1527   CL 105 05/12/2024 1527   CO2 26 05/12/2024 1527   GLUCOSE 78 05/12/2024 1527   BUN 13 05/12/2024 1527   CREATININE 0.61 05/12/2024 1527   CALCIUM 9.0 05/12/2024 1527   GFRNONAA >60 09/20/2021 2004   GFRNONAA 90 12/08/2020 1235   GFRAA 104 12/08/2020 1235   Lab Results  Component Value Date   HGBA1C 5.2 10/24/2022   HGBA1C 5.5 09/26/2018   No results found for: INSULIN CBC    Component Value Date/Time   WBC 7.1 05/12/2024 1527   RBC 4.29 05/12/2024 1527   HGB 13.0 05/12/2024 1527   HCT 39.7 05/12/2024 1527   PLT 366 05/12/2024 1527   MCV 92.5 05/12/2024 1527   MCH 30.3 05/12/2024 1527   MCHC 32.7 05/12/2024 1527   RDW 12.0 05/12/2024 1527   Iron/TIBC/Ferritin/ %Sat No results found for: IRON, TIBC, FERRITIN, IRONPCTSAT Lipid Panel     Component Value Date/Time   CHOL  178 10/24/2022 1218   CHOL 178 09/23/2020 1611   TRIG 128.0 10/24/2022 1218   HDL 55.10 10/24/2022 1218   HDL 52 09/23/2020 1611   CHOLHDL 3 10/24/2022 1218   VLDL 25.6 10/24/2022 1218   LDLCALC 97 10/24/2022 1218   LDLCALC 106 (H) 09/23/2020 1611   Hepatic Function Panel     Component Value Date/Time   PROT 6.7 05/12/2024 1527   ALBUMIN 3.6 07/19/2015 1840   AST 17 05/12/2024 1527   ALT 14 05/12/2024 1527   ALKPHOS 58 07/19/2015 1840   BILITOT 0.4 05/12/2024 1527      Component Value Date/Time   TSH 1.63 10/28/2023 1636     Assessment and Plan   Chronic  migraine w/o aura w/o status migrainosus, not intractable  Postoperative hypothyroidism  PCOS (polycystic ovarian syndrome)  Vitamin D  deficiency  Healthcare maintenance  Obesity (BMI 30-39.9), STARTING BMI 37.9    Assessment and Plan          ESTABLISH WITH [facility]   Obesity Treatment / Action Plan:  Patient will work on garnering support from family and friends to begin weight loss journey. Will work on eliminating or reducing the presence of highly palatable, calorie dense foods in the home. Will complete provided nutritional and psychosocial assessment questionnaire before the next appointment. Will be scheduled for indirect calorimetry to determine resting energy expenditure in a fasting state.  This will allow us  to create a reduced calorie, high-protein meal plan to promote loss of fat mass while preserving muscle mass. Counseled on the health benefits of losing 5%-15% of total body weight. Was counseled on nutritional approaches to weight loss and benefits of reducing processed foods and consuming plant-based foods and high quality protein as part of nutritional weight management. Was counseled on pharmacotherapy and role as an adjunct in weight management.   Obesity Education Performed Today:  She was weighed on the bioimpedance scale and results were discussed and documented in the synopsis.  We discussed obesity as a disease and the importance of a more detailed evaluation of all the factors contributing to the disease.  We discussed the importance of long term lifestyle changes which include nutrition, exercise and behavioral modifications as well as the importance of customizing this to her specific health and social needs.  We discussed the benefits of reaching a healthier weight to alleviate the symptoms of existing conditions and reduce the risks of the biomechanical, metabolic and psychological effects of obesity.  We reviewed the four pillars of obesity medicine  and importance of using a multimodal approach.  We reviewed the basic principles in weight management.   Taylor Bennett appears to be in the action stage of change and states they are ready to start intensive lifestyle modifications and behavioral modifications.  I have spent 26 minutes in the care of the patient today including: 3 minutes before the visit reviewing and preparing the chart. 20 minutes face-to-face assessing and reviewing listed medical problems as outlined in obesity care plan, providing nutritional and behavioral counseling on topics outlined in the obesity care plan, counseling regarding anti-obesity medication as outlined in obesity care plan, independently interpreting test results and goals of care, as described in assessment and plan, and reviewing and discussing biometric information and progress 3 minutes after the visit updating chart and documentation of encounter.  Reviewed by clinician on day of visit: allergies, medications, problem list, medical history, surgical history, family history, social history, and previous encounter notes pertinent to obesity diagnosis.  Bon Dowis d. Maheen Cwikla, NP-C

## 2024-07-31 ENCOUNTER — Other Ambulatory Visit (HOSPITAL_COMMUNITY): Payer: Self-pay

## 2024-08-18 ENCOUNTER — Ambulatory Visit (INDEPENDENT_AMBULATORY_CARE_PROVIDER_SITE_OTHER): Admitting: Otolaryngology

## 2024-08-25 ENCOUNTER — Other Ambulatory Visit (HOSPITAL_COMMUNITY): Payer: Self-pay

## 2024-08-25 ENCOUNTER — Ambulatory Visit (INDEPENDENT_AMBULATORY_CARE_PROVIDER_SITE_OTHER): Admitting: Otolaryngology

## 2024-08-25 ENCOUNTER — Encounter (INDEPENDENT_AMBULATORY_CARE_PROVIDER_SITE_OTHER): Payer: Self-pay | Admitting: Otolaryngology

## 2024-08-25 VITALS — BP 128/84 | HR 73

## 2024-08-25 DIAGNOSIS — J383 Other diseases of vocal cords: Secondary | ICD-10-CM

## 2024-08-25 DIAGNOSIS — H60543 Acute eczematoid otitis externa, bilateral: Secondary | ICD-10-CM | POA: Diagnosis not present

## 2024-08-25 DIAGNOSIS — R490 Dysphonia: Secondary | ICD-10-CM | POA: Diagnosis not present

## 2024-08-25 MED ORDER — FLUOCINOLONE ACETONIDE 0.01 % OT OIL
4.0000 [drp] | TOPICAL_OIL | Freq: Two times a day (BID) | OTIC | 0 refills | Status: AC
  Filled 2024-08-25: qty 20, 30d supply, fill #0

## 2024-08-25 NOTE — Progress Notes (Signed)
 Reason for Consult: Follow-up Referring Physician: Sydelle Redmon  Taylor Bennett is an 43 y.o. female.  HPI: She is here previously with the vocal cord bowing and hoarseness issues.  She is now for follow-up.  She also describes a itching and a discomfort in both ears.  It feels like she has eczema.  No drainage.  No decreased hearing.  Past Medical History:  Diagnosis Date   Back pain    Cervical pain    Cervical spondylolysis    DDD (degenerative disc disease), lumbar    History of IBS    per patient   PCOS (polycystic ovarian syndrome)    Rheumatoid arthritis (HCC)    Scoliosis    Thyroid  disease     Past Surgical History:  Procedure Laterality Date   ANKLE SURGERY  2009   ORIF    COLONOSCOPY     THYROIDECTOMY     WISDOM TOOTH EXTRACTION      Family History  Problem Relation Age of Onset   Thyroid  disease Mother    Arthritis Mother    Colon cancer Father    Diabetes Father    Congestive Heart Failure Father    Alcoholism Father    Neuropathy Father    Arthritis Father    Osteoarthritis Maternal Aunt    Rheum arthritis Paternal Grandmother    Colon cancer Paternal Grandfather    Stomach cancer Paternal Grandfather    Healthy Daughter    Polycystic ovary syndrome Daughter    Esophageal cancer Neg Hx    Rectal cancer Neg Hx     Social History:  reports that she quit smoking about 13 years ago. Her smoking use included cigarettes. She started smoking about 19 years ago. She has never been exposed to tobacco smoke. She has never used smokeless tobacco. She reports current alcohol use. She reports that she does not use drugs.  Allergies: Allergies[1]   No results found for this or any previous visit (from the past 48 hours).  No results found.  ROS Blood pressure 128/84, pulse 73, SpO2 98%. Physical Exam Constitutional:      Appearance: Normal appearance.  HENT:     Head: Normocephalic and atraumatic.     Right Ear: Tympanic membrane is without  lesions and middle ear aerated, ear canal and external ear normal.     Left Ear: Tympanic membrane is without lesions and middle ear aerated, ear canal and external ear normal.     Nose: Nose without deviation of septum.  Turbinates with mild hypertrophy, No significant swelling or masses.     Oral cavity/oropharynx: Mucous membranes are moist. No lesions or masses    Larynx: normal voice. Mirror attempted without success    Eyes:     Extraocular Movements: Extraocular movements intact.     Conjunctiva/sclera: Conjunctivae normal.     Pupils: Pupils are equal, round, and reactive to light.  Cardiovascular:     Rate and Rhythm: Normal rate.  Pulmonary:     Effort: Pulmonary effort is normal.  Musculoskeletal:     Cervical back: Normal range of motion and neck supple. No rigidity.  Lymphadenopathy:     Cervical: No cervical adenopathy or masses.salivary glands without lesions. .     Salivary glands- no mass or swelling Neurological:     Mental Status: He is alert. CN 2-12 intact. No nystagmus      Assessment/Plan: Otitis externa-she has a itching flaking problem with the skin of her ear canal and wants a  eardrop.  I will try her on Derm otic.  She also use water precautions  Hoarseness/spasmodic dysphonia-she may have a component of this and I think she needs a laryngologist for full evaluation and discussion of therapy.  She did not want a barium swallow for the globus sensation.  I will refer her to [name] 08/25/2024, 4:25 PM        [1]  Allergies Allergen Reactions   Doxycycline Nausea Only

## 2024-08-26 ENCOUNTER — Encounter (INDEPENDENT_AMBULATORY_CARE_PROVIDER_SITE_OTHER): Payer: Self-pay

## 2024-09-24 ENCOUNTER — Other Ambulatory Visit: Payer: Self-pay | Admitting: Physician Assistant

## 2024-09-25 ENCOUNTER — Other Ambulatory Visit (HOSPITAL_COMMUNITY): Payer: Self-pay

## 2024-09-25 MED ORDER — HYDROXYCHLOROQUINE SULFATE 200 MG PO TABS
200.0000 mg | ORAL_TABLET | Freq: Two times a day (BID) | ORAL | 0 refills | Status: AC
  Filled 2024-09-25: qty 120, 60d supply, fill #0

## 2024-09-25 NOTE — Telephone Encounter (Signed)
 Last Fill: 06/08/2024  Eye exam: 02/06/2024   Labs: 05/12/2024 CBC and CMP WNL ESR is borderline elevated. CRP remains slightly elevated   Next Visit: 10/20/2024  Last Visit: 05/12/2024  DX: Seropositive rheumatoid arthritis   Current Dose per office note on 05/12/2024: Plaquenil  200 mg 1 tablet by mouth twice daily monday through friday.   Okay to refill Plaquenil ?

## 2024-10-06 NOTE — Progress Notes (Unsigned)
 "  Office Visit Note  Patient: Taylor Bennett             Date of Birth: 10/12/1980           MRN: [id number]             PCP: Alvera Reagin, PA Referring: Alvera Reagin, PA Visit Date: 10/20/2024 Occupation: Data Unavailable  Subjective:  No chief complaint on file.   History of Present Illness: Allicia Culley is a 44 y.o. female ***     Activities of Daily Living:  Patient reports morning stiffness for *** {minute/hour:19697}.   Patient {ACTIONS;DENIES/REPORTS:21021675::Denies} nocturnal pain.  Difficulty dressing/grooming: {ACTIONS;DENIES/REPORTS:21021675::Denies} Difficulty climbing stairs: {ACTIONS;DENIES/REPORTS:21021675::Denies} Difficulty getting out of chair: {ACTIONS;DENIES/REPORTS:21021675::Denies} Difficulty using hands for taps, buttons, cutlery, and/or writing: {ACTIONS;DENIES/REPORTS:21021675::Denies}  No Rheumatology ROS completed.   PMFS History:  Patient Active Problem List   Diagnosis Date Noted   Pain in right wrist 10/31/2022   Seropositive rheumatoid arthritis (HCC) 08/29/2022   Primary osteoarthritis of both hands 08/29/2022   Primary osteoarthritis of both feet 08/29/2022   DDD (degenerative disc disease), cervical 08/29/2022   DDD (degenerative disc disease), lumbar 08/29/2022   Chronic bilateral low back pain 02/01/2022   Chronic migraine w/o aura w/o status migrainosus, not intractable 02/01/2022   Paresthesia 11/30/2016   Neck pain 10/23/2016   Left cervical radiculopathy 10/23/2016   Vitamin D  deficiency 08/10/2016   PCOS (polycystic ovarian syndrome) 06/16/2013   Migraines 06/16/2013   Hypothyroidism 06/10/2013    Past Medical History:  Diagnosis Date   Back pain    Cervical pain    Cervical spondylolysis    DDD (degenerative disc disease), lumbar    History of IBS    per patient   PCOS (polycystic ovarian syndrome)    Rheumatoid arthritis (HCC)    Scoliosis    Thyroid  disease     Family History  Problem  Relation Age of Onset   Thyroid  disease Mother    Arthritis Mother    Colon cancer Father    Diabetes Father    Congestive Heart Failure Father    Alcoholism Father    Neuropathy Father    Arthritis Father    Osteoarthritis Maternal Aunt    Rheum arthritis Paternal Grandmother    Colon cancer Paternal Grandfather    Stomach cancer Paternal Grandfather    Healthy Daughter    Polycystic ovary syndrome Daughter    Esophageal cancer Neg Hx    Rectal cancer Neg Hx    Past Surgical History:  Procedure Laterality Date   ANKLE SURGERY  2009   ORIF    COLONOSCOPY     THYROIDECTOMY     WISDOM TOOTH EXTRACTION     Social History[1] Social History   Social History Narrative   Lives at home with her fiance and children.   Right-handed.   1-2 cup caffeine daily.     Immunization History  Administered Date(s) Administered   Influenza-Unspecified 06/03/2014, 06/20/2015   Moderna Sars-Covid-2 Vaccination 10/02/2019, 11/02/2019, 07/09/2020   Pfizer(Comirnaty)Fall Seasonal Vaccine 12 years and older 07/10/2024     Objective: Vital Signs: There were no vitals taken for this visit.   Physical Exam   Musculoskeletal Exam: ***  CDAI Exam: CDAI Score: -- Patient Global: --; Provider Global: -- Swollen: --; Tender: -- Joint Exam 10/20/2024   No joint exam has been documented for this visit   There is currently no information documented on the homunculus. Go to the Rheumatology activity and complete the homunculus  joint exam.  Investigation: No additional findings.  Imaging: No results found.  Recent Labs: Lab Results  Component Value Date   WBC 7.1 05/12/2024   HGB 13.0 05/12/2024   PLT 366 05/12/2024   NA 138 05/12/2024   K 4.2 05/12/2024   CL 105 05/12/2024   CO2 26 05/12/2024   GLUCOSE 78 05/12/2024   BUN 13 05/12/2024   CREATININE 0.61 05/12/2024   BILITOT 0.4 05/12/2024   ALKPHOS 58 07/19/2015   AST 17 05/12/2024   ALT 14 05/12/2024   PROT 6.7 05/12/2024    ALBUMIN 3.6 07/19/2015   CALCIUM 9.0 05/12/2024   GFRAA 104 12/08/2020    Speciality Comments: PLQ EYe Exam: 02/06/2024 WNL @ [facility] f/u 1 year  TB Gold: 07/21/2021 Negative  Contacted [facility] and advised them to fax over patient's most recent eye exam.   Procedures:  No procedures performed Allergies: Doxycycline   Assessment / Plan:     Visit Diagnoses: No diagnosis found.  Orders: No orders of the defined types were placed in this encounter.  No orders of the defined types were placed in this encounter.   Face-to-face time spent with patient was *** minutes. Greater than 50% of time was spent in counseling and coordination of care.  Follow-Up Instructions: No follow-ups on file.   Maya Nash, MD  Note - This record has been created using [employer].  Chart creation errors have been sought, but may not always  have been located. Such creation errors do not reflect on  the standard of medical care.    [1]  Social History Tobacco Use   Smoking status: Former    Current packs/day: 0.00    Types: Cigarettes    Start date: 2006    Quit date: 2012    Years since quitting: 14.0    Passive exposure: Never   Smokeless tobacco: Never   Tobacco comments:    Social smoker  Vaping Use   Vaping status: Never Used  Substance Use Topics   Alcohol use: Yes    Comment: wine occ   Drug use: No   "

## 2024-10-15 NOTE — Progress Notes (Unsigned)
 "  Office Visit Note  Patient: Taylor Bennett             Date of Birth: 08/21/1981           MRN: [id number]             PCP: Alvera Reagin, PA Referring: Alvera Reagin, PA Visit Date: 10/23/2024 Occupation: Data Unavailable  Subjective:  No chief complaint on file.   History of Present Illness: Taylor Bennett is a 44 y.o. female ***     Activities of Daily Living:  Patient reports morning stiffness for *** {minute/hour:19697}.   Patient {ACTIONS;DENIES/REPORTS:21021675::Denies} nocturnal pain.  Difficulty dressing/grooming: {ACTIONS;DENIES/REPORTS:21021675::Denies} Difficulty climbing stairs: {ACTIONS;DENIES/REPORTS:21021675::Denies} Difficulty getting out of chair: {ACTIONS;DENIES/REPORTS:21021675::Denies} Difficulty using hands for taps, buttons, cutlery, and/or writing: {ACTIONS;DENIES/REPORTS:21021675::Denies}  No Rheumatology ROS completed.   PMFS History:  Patient Active Problem List   Diagnosis Date Noted   Pain in right wrist 10/31/2022   Seropositive rheumatoid arthritis (HCC) 08/29/2022   Primary osteoarthritis of both hands 08/29/2022   Primary osteoarthritis of both feet 08/29/2022   DDD (degenerative disc disease), cervical 08/29/2022   DDD (degenerative disc disease), lumbar 08/29/2022   Chronic bilateral low back pain 02/01/2022   Chronic migraine w/o aura w/o status migrainosus, not intractable 02/01/2022   Paresthesia 11/30/2016   Neck pain 10/23/2016   Left cervical radiculopathy 10/23/2016   Vitamin D  deficiency 08/10/2016   PCOS (polycystic ovarian syndrome) 06/16/2013   Migraines 06/16/2013   Hypothyroidism 06/10/2013    Past Medical History:  Diagnosis Date   Back pain    Cervical pain    Cervical spondylolysis    DDD (degenerative disc disease), lumbar    History of IBS    per patient   PCOS (polycystic ovarian syndrome)    Rheumatoid arthritis (HCC)    Scoliosis    Thyroid  disease     Family History  Problem  Relation Age of Onset   Thyroid  disease Mother    Arthritis Mother    Colon cancer Father    Diabetes Father    Congestive Heart Failure Father    Alcoholism Father    Neuropathy Father    Arthritis Father    Osteoarthritis Maternal Aunt    Rheum arthritis Paternal Grandmother    Colon cancer Paternal Grandfather    Stomach cancer Paternal Grandfather    Healthy Daughter    Polycystic ovary syndrome Daughter    Esophageal cancer Neg Hx    Rectal cancer Neg Hx    Past Surgical History:  Procedure Laterality Date   ANKLE SURGERY  2009   ORIF    COLONOSCOPY     THYROIDECTOMY     WISDOM TOOTH EXTRACTION     Social History[1] Social History   Social History Narrative   Lives at home with her fiance and children.   Right-handed.   1-2 cup caffeine daily.     Immunization History  Administered Date(s) Administered   Influenza-Unspecified 06/03/2014, 06/20/2015   Moderna Sars-Covid-2 Vaccination 10/02/2019, 11/02/2019, 07/09/2020   Pfizer(Comirnaty)Fall Seasonal Vaccine 12 years and older 07/10/2024     Objective: Vital Signs: There were no vitals taken for this visit.   Physical Exam   Musculoskeletal Exam: ***  CDAI Exam: CDAI Score: -- Patient Global: --; Provider Global: -- Swollen: --; Tender: -- Joint Exam 10/23/2024   No joint exam has been documented for this visit   There is currently no information documented on the homunculus. Go to the Rheumatology activity and complete the homunculus  joint exam.  Investigation: No additional findings.  Imaging: No results found.  Recent Labs: Lab Results  Component Value Date   WBC 7.1 05/12/2024   HGB 13.0 05/12/2024   PLT 366 05/12/2024   NA 138 05/12/2024   K 4.2 05/12/2024   CL 105 05/12/2024   CO2 26 05/12/2024   GLUCOSE 78 05/12/2024   BUN 13 05/12/2024   CREATININE 0.61 05/12/2024   BILITOT 0.4 05/12/2024   ALKPHOS 58 07/19/2015   AST 17 05/12/2024   ALT 14 05/12/2024   PROT 6.7 05/12/2024    ALBUMIN 3.6 07/19/2015   CALCIUM 9.0 05/12/2024   GFRAA 104 12/08/2020    Speciality Comments: PLQ EYe Exam: 02/06/2024 WNL @ [facility] f/u 1 year  TB Gold: 07/21/2021 Negative  Contacted [facility] and advised them to fax over patient's most recent eye exam.   Procedures:  No procedures performed Allergies: Doxycycline   Assessment / Plan:     Visit Diagnoses: No diagnosis found.  Orders: No orders of the defined types were placed in this encounter.  No orders of the defined types were placed in this encounter.   Face-to-face time spent with patient was *** minutes. Greater than 50% of time was spent in counseling and coordination of care.  Follow-Up Instructions: No follow-ups on file.   Shelba SHAUNNA Potters, RT  Note - This record has been created using [employer].  Chart creation errors have been sought, but may not always  have been located. Such creation errors do not reflect on  the standard of medical care.    [1]  Social History Tobacco Use   Smoking status: Former    Current packs/day: 0.00    Types: Cigarettes    Start date: 2006    Quit date: 2012    Years since quitting: 14.1    Passive exposure: Never   Smokeless tobacco: Never   Tobacco comments:    Social smoker  Vaping Use   Vaping status: Never Used  Substance Use Topics   Alcohol use: Yes    Comment: wine occ   Drug use: No   "

## 2024-10-20 ENCOUNTER — Ambulatory Visit: Admitting: Rheumatology

## 2024-10-20 DIAGNOSIS — M5030 Other cervical disc degeneration, unspecified cervical region: Secondary | ICD-10-CM

## 2024-10-20 DIAGNOSIS — M7918 Myalgia, other site: Secondary | ICD-10-CM

## 2024-10-20 DIAGNOSIS — M19041 Primary osteoarthritis, right hand: Secondary | ICD-10-CM

## 2024-10-20 DIAGNOSIS — Z8669 Personal history of other diseases of the nervous system and sense organs: Secondary | ICD-10-CM

## 2024-10-20 DIAGNOSIS — Z8261 Family history of arthritis: Secondary | ICD-10-CM

## 2024-10-20 DIAGNOSIS — E559 Vitamin D deficiency, unspecified: Secondary | ICD-10-CM

## 2024-10-20 DIAGNOSIS — L659 Nonscarring hair loss, unspecified: Secondary | ICD-10-CM

## 2024-10-20 DIAGNOSIS — M7061 Trochanteric bursitis, right hip: Secondary | ICD-10-CM

## 2024-10-20 DIAGNOSIS — M2242 Chondromalacia patellae, left knee: Secondary | ICD-10-CM

## 2024-10-20 DIAGNOSIS — M19071 Primary osteoarthritis, right ankle and foot: Secondary | ICD-10-CM

## 2024-10-20 DIAGNOSIS — M47816 Spondylosis without myelopathy or radiculopathy, lumbar region: Secondary | ICD-10-CM

## 2024-10-20 DIAGNOSIS — M059 Rheumatoid arthritis with rheumatoid factor, unspecified: Secondary | ICD-10-CM

## 2024-10-20 DIAGNOSIS — E890 Postprocedural hypothyroidism: Secondary | ICD-10-CM

## 2024-10-20 DIAGNOSIS — G8929 Other chronic pain: Secondary | ICD-10-CM

## 2024-10-20 DIAGNOSIS — M4124 Other idiopathic scoliosis, thoracic region: Secondary | ICD-10-CM

## 2024-10-20 DIAGNOSIS — Z79899 Other long term (current) drug therapy: Secondary | ICD-10-CM

## 2024-10-23 ENCOUNTER — Ambulatory Visit: Admitting: Internal Medicine

## 2024-10-23 ENCOUNTER — Ambulatory Visit: Admitting: Physician Assistant

## 2024-10-23 DIAGNOSIS — M19071 Primary osteoarthritis, right ankle and foot: Secondary | ICD-10-CM

## 2024-10-23 DIAGNOSIS — M7061 Trochanteric bursitis, right hip: Secondary | ICD-10-CM

## 2024-10-23 DIAGNOSIS — M7918 Myalgia, other site: Secondary | ICD-10-CM

## 2024-10-23 DIAGNOSIS — E282 Polycystic ovarian syndrome: Secondary | ICD-10-CM

## 2024-10-23 DIAGNOSIS — M19041 Primary osteoarthritis, right hand: Secondary | ICD-10-CM

## 2024-10-23 DIAGNOSIS — Z79899 Other long term (current) drug therapy: Secondary | ICD-10-CM

## 2024-10-23 DIAGNOSIS — G8929 Other chronic pain: Secondary | ICD-10-CM

## 2024-10-23 DIAGNOSIS — M5030 Other cervical disc degeneration, unspecified cervical region: Secondary | ICD-10-CM

## 2024-10-23 DIAGNOSIS — M47816 Spondylosis without myelopathy or radiculopathy, lumbar region: Secondary | ICD-10-CM

## 2024-10-23 DIAGNOSIS — E890 Postprocedural hypothyroidism: Secondary | ICD-10-CM

## 2024-10-23 DIAGNOSIS — M4124 Other idiopathic scoliosis, thoracic region: Secondary | ICD-10-CM

## 2024-10-23 DIAGNOSIS — Z8261 Family history of arthritis: Secondary | ICD-10-CM

## 2024-10-23 DIAGNOSIS — M059 Rheumatoid arthritis with rheumatoid factor, unspecified: Secondary | ICD-10-CM

## 2024-10-23 DIAGNOSIS — E559 Vitamin D deficiency, unspecified: Secondary | ICD-10-CM

## 2024-10-23 DIAGNOSIS — M2242 Chondromalacia patellae, left knee: Secondary | ICD-10-CM

## 2024-10-23 DIAGNOSIS — L659 Nonscarring hair loss, unspecified: Secondary | ICD-10-CM

## 2024-10-23 DIAGNOSIS — Z8669 Personal history of other diseases of the nervous system and sense organs: Secondary | ICD-10-CM

## 2024-10-27 ENCOUNTER — Ambulatory Visit: Payer: Commercial Managed Care - PPO | Admitting: Internal Medicine

## 2025-03-09 ENCOUNTER — Ambulatory Visit: Admitting: Dermatology
